# Patient Record
Sex: Female | Born: 1955
Health system: Southern US, Community
[De-identification: ages and names within clinical notes are randomized; demographics above are authoritative.]

## PROBLEM LIST (undated history)

## (undated) DIAGNOSIS — J349 Unspecified disorder of nose and nasal sinuses: Secondary | ICD-10-CM

## (undated) DIAGNOSIS — Z8041 Family history of malignant neoplasm of ovary: Secondary | ICD-10-CM

## (undated) DIAGNOSIS — Z803 Family history of malignant neoplasm of breast: Secondary | ICD-10-CM

## (undated) DIAGNOSIS — H269 Unspecified cataract: Secondary | ICD-10-CM

## (undated) DIAGNOSIS — E785 Hyperlipidemia, unspecified: Secondary | ICD-10-CM

## (undated) DIAGNOSIS — R040 Epistaxis: Secondary | ICD-10-CM

## (undated) DIAGNOSIS — M1990 Unspecified osteoarthritis, unspecified site: Secondary | ICD-10-CM

## (undated) DIAGNOSIS — C801 Malignant (primary) neoplasm, unspecified: Secondary | ICD-10-CM

## (undated) DIAGNOSIS — IMO0002 Reserved for concepts with insufficient information to code with codable children: Secondary | ICD-10-CM

## (undated) DIAGNOSIS — Z800 Family history of malignant neoplasm of digestive organs: Secondary | ICD-10-CM

## (undated) DIAGNOSIS — T7840XA Allergy, unspecified, initial encounter: Secondary | ICD-10-CM

## (undated) DIAGNOSIS — E079 Disorder of thyroid, unspecified: Secondary | ICD-10-CM

## (undated) HISTORY — DX: Disorder of thyroid, unspecified: E07.9

## (undated) HISTORY — DX: Allergy, unspecified, initial encounter: T78.40XA

## (undated) HISTORY — DX: Hyperlipidemia, unspecified: E78.5

## (undated) HISTORY — DX: Unspecified disorder of nose and nasal sinuses: J34.9

## (undated) HISTORY — DX: Unspecified cataract: H26.9

## (undated) HISTORY — DX: Family history of malignant neoplasm of breast: Z80.3

## (undated) HISTORY — DX: Epistaxis: R04.0

## (undated) HISTORY — DX: Family history of malignant neoplasm of ovary: Z80.41

## (undated) HISTORY — DX: Family history of malignant neoplasm of digestive organs: Z80.0

## (undated) HISTORY — PX: BREAST SURGERY: SHX581

## (undated) HISTORY — PX: BRAIN SURGERY: SHX531

## (undated) HISTORY — DX: Unspecified osteoarthritis, unspecified site: M19.90

## (undated) HISTORY — DX: Malignant (primary) neoplasm, unspecified: C80.1

## (undated) HISTORY — DX: Reserved for concepts with insufficient information to code with codable children: IMO0002

## (undated) HISTORY — PX: COLONOSCOPY: SHX174

---

## 1995-12-01 HISTORY — PX: GANGLION CYST EXCISION: SHX1691

## 2000-06-24 ENCOUNTER — Other Ambulatory Visit: Admission: RE | Admit: 2000-06-24 | Discharge: 2000-06-24 | Payer: Self-pay | Admitting: Gynecology

## 2001-01-19 ENCOUNTER — Other Ambulatory Visit: Admission: RE | Admit: 2001-01-19 | Discharge: 2001-01-19 | Payer: Self-pay | Admitting: Gynecology

## 2001-07-28 ENCOUNTER — Other Ambulatory Visit: Admission: RE | Admit: 2001-07-28 | Discharge: 2001-07-28 | Payer: Self-pay | Admitting: Gynecology

## 2002-08-17 ENCOUNTER — Other Ambulatory Visit: Admission: RE | Admit: 2002-08-17 | Discharge: 2002-08-17 | Payer: Self-pay | Admitting: Gynecology

## 2003-08-23 ENCOUNTER — Other Ambulatory Visit: Admission: RE | Admit: 2003-08-23 | Discharge: 2003-08-23 | Payer: Self-pay | Admitting: Gynecology

## 2004-09-10 ENCOUNTER — Other Ambulatory Visit: Admission: RE | Admit: 2004-09-10 | Discharge: 2004-09-10 | Payer: Self-pay | Admitting: Gynecology

## 2005-06-05 ENCOUNTER — Ambulatory Visit: Payer: Self-pay | Admitting: Gastroenterology

## 2005-07-01 ENCOUNTER — Ambulatory Visit: Payer: Self-pay | Admitting: Gastroenterology

## 2008-04-01 DIAGNOSIS — C801 Malignant (primary) neoplasm, unspecified: Secondary | ICD-10-CM

## 2008-04-01 HISTORY — DX: Malignant (primary) neoplasm, unspecified: C80.1

## 2008-10-30 HISTORY — PX: BREAST SURGERY: SHX581

## 2008-11-11 ENCOUNTER — Encounter: Admission: RE | Admit: 2008-11-11 | Discharge: 2008-11-11 | Payer: Self-pay | Admitting: General Surgery

## 2008-11-18 ENCOUNTER — Encounter: Admission: RE | Admit: 2008-11-18 | Discharge: 2008-11-18 | Payer: Self-pay | Admitting: General Surgery

## 2008-11-23 ENCOUNTER — Encounter (INDEPENDENT_AMBULATORY_CARE_PROVIDER_SITE_OTHER): Payer: Self-pay | Admitting: General Surgery

## 2008-11-23 ENCOUNTER — Ambulatory Visit (HOSPITAL_BASED_OUTPATIENT_CLINIC_OR_DEPARTMENT_OTHER): Admission: RE | Admit: 2008-11-23 | Discharge: 2008-11-23 | Payer: Self-pay | Admitting: General Surgery

## 2008-11-30 ENCOUNTER — Ambulatory Visit: Payer: Self-pay | Admitting: Oncology

## 2008-12-06 ENCOUNTER — Ambulatory Visit: Admission: RE | Admit: 2008-12-06 | Discharge: 2009-02-03 | Payer: Self-pay | Admitting: Radiation Oncology

## 2008-12-15 LAB — CBC WITH DIFFERENTIAL (CANCER CENTER ONLY)
BASO#: 0.1 10*3/uL (ref 0.0–0.2)
Eosinophils Absolute: 0.2 10*3/uL (ref 0.0–0.5)
HGB: 13 g/dL (ref 11.6–15.9)
LYMPH%: 48.7 % — ABNORMAL HIGH (ref 14.0–48.0)
MCH: 32.2 pg (ref 26.0–34.0)
MCV: 94 fL (ref 81–101)
MONO#: 0.4 10*3/uL (ref 0.1–0.9)
MONO%: 7.2 % (ref 0.0–13.0)
RBC: 4.04 10*6/uL (ref 3.70–5.32)
WBC: 5.6 10*3/uL (ref 3.9–10.0)

## 2008-12-15 LAB — CMP (CANCER CENTER ONLY)
Albumin: 3.6 g/dL (ref 3.3–5.5)
Alkaline Phosphatase: 73 U/L (ref 26–84)
BUN, Bld: 15 mg/dL (ref 7–22)
CO2: 31 mEq/L (ref 18–33)
Calcium: 9.1 mg/dL (ref 8.0–10.3)
Glucose, Bld: 98 mg/dL (ref 73–118)
Potassium: 3.8 mEq/L (ref 3.3–4.7)

## 2009-02-13 ENCOUNTER — Ambulatory Visit: Payer: Self-pay | Admitting: Oncology

## 2009-02-15 LAB — CBC WITH DIFFERENTIAL (CANCER CENTER ONLY)
BASO#: 0 10*3/uL (ref 0.0–0.2)
Eosinophils Absolute: 0.2 10*3/uL (ref 0.0–0.5)
HGB: 13.6 g/dL (ref 11.6–15.9)
LYMPH#: 1.3 10*3/uL (ref 0.9–3.3)
MONO#: 0.3 10*3/uL (ref 0.1–0.9)
NEUT#: 2.6 10*3/uL (ref 1.5–6.5)
Platelets: 254 10*3/uL (ref 145–400)
RBC: 4.24 10*6/uL (ref 3.70–5.32)
WBC: 4.5 10*3/uL (ref 3.9–10.0)

## 2009-02-15 LAB — CMP (CANCER CENTER ONLY)
Alkaline Phosphatase: 78 U/L (ref 26–84)
Creat: 0.5 mg/dl — ABNORMAL LOW (ref 0.6–1.2)
Glucose, Bld: 109 mg/dL (ref 73–118)
Sodium: 140 mEq/L (ref 128–145)
Total Bilirubin: 0.7 mg/dl (ref 0.20–1.60)
Total Protein: 7.1 g/dL (ref 6.4–8.1)

## 2009-05-02 ENCOUNTER — Ambulatory Visit: Payer: Self-pay | Admitting: Oncology

## 2009-05-04 LAB — CMP (CANCER CENTER ONLY)
ALT(SGPT): 20 U/L (ref 10–47)
CO2: 29 mEq/L (ref 18–33)
Creat: 1 mg/dl (ref 0.6–1.2)
Glucose, Bld: 108 mg/dL (ref 73–118)
Total Bilirubin: 0.5 mg/dl (ref 0.20–1.60)

## 2009-05-04 LAB — CBC WITH DIFFERENTIAL (CANCER CENTER ONLY)
BASO%: 0.5 % (ref 0.0–2.0)
HCT: 39.7 % (ref 34.8–46.6)
LYMPH%: 31.2 % (ref 14.0–48.0)
MCV: 94 fL (ref 81–101)
MONO#: 0.3 10*3/uL (ref 0.1–0.9)
NEUT%: 58.3 % (ref 39.6–80.0)
RDW: 10.8 % (ref 10.5–14.6)
WBC: 4.9 10*3/uL (ref 3.9–10.0)

## 2009-11-01 ENCOUNTER — Ambulatory Visit: Payer: Self-pay | Admitting: Oncology

## 2009-11-07 LAB — CMP (CANCER CENTER ONLY)
ALT(SGPT): 30 U/L (ref 10–47)
AST: 30 U/L (ref 11–38)
CO2: 29 mEq/L (ref 18–33)
Creat: 0.7 mg/dl (ref 0.6–1.2)
Total Bilirubin: 0.6 mg/dl (ref 0.20–1.60)

## 2009-11-07 LAB — CBC WITH DIFFERENTIAL (CANCER CENTER ONLY)
BASO%: 0.6 % (ref 0.0–2.0)
EOS%: 3.9 % (ref 0.0–7.0)
HCT: 38.3 % (ref 34.8–46.6)
LYMPH%: 36 % (ref 14.0–48.0)
MCHC: 35 g/dL (ref 32.0–36.0)
MCV: 91 fL (ref 81–101)
MONO%: 7.1 % (ref 0.0–13.0)
NEUT%: 52.4 % (ref 39.6–80.0)
RDW: 11.2 % (ref 10.5–14.6)

## 2010-04-01 DIAGNOSIS — IMO0002 Reserved for concepts with insufficient information to code with codable children: Secondary | ICD-10-CM

## 2010-04-01 HISTORY — DX: Reserved for concepts with insufficient information to code with codable children: IMO0002

## 2010-07-07 LAB — CBC
HCT: 42.3 % (ref 36.0–46.0)
Hemoglobin: 14.8 g/dL (ref 12.0–15.0)
RBC: 4.51 MIL/uL (ref 3.87–5.11)
WBC: 6.7 10*3/uL (ref 4.0–10.5)

## 2010-07-07 LAB — COMPREHENSIVE METABOLIC PANEL
ALT: 25 U/L (ref 0–35)
Alkaline Phosphatase: 79 U/L (ref 39–117)
CO2: 30 mEq/L (ref 19–32)
Chloride: 104 mEq/L (ref 96–112)
GFR calc non Af Amer: >60 mL/min (ref >60)
Glucose, Bld: 87 mg/dL (ref 70–99)
Potassium: 4.4 mEq/L (ref 3.5–5.1)
Sodium: 138 mEq/L (ref 135–145)

## 2010-07-07 LAB — CANCER ANTIGEN 27.29: CA 27.29: 10 U/mL (ref 0–39)

## 2010-07-07 LAB — LACTATE DEHYDROGENASE: LDH: 157 U/L (ref 94–250)

## 2010-07-07 LAB — DIFFERENTIAL
Basophils Relative: 0 % (ref 0–1)
Eosinophils Absolute: 0.1 10*3/uL (ref 0.0–0.7)
Eosinophils Relative: 2 % (ref 0–5)
Neutrophils Relative %: 50 % (ref 43–77)

## 2010-08-02 ENCOUNTER — Encounter (INDEPENDENT_AMBULATORY_CARE_PROVIDER_SITE_OTHER): Payer: Self-pay | Admitting: General Surgery

## 2010-08-14 NOTE — Op Note (Signed)
Julia Poole, Julia Poole                ACCOUNT NO.:  [id number]   MEDICAL RECORD NO.:  [id number]          PATIENT TYPE:  AMB   LOCATION:  DSC                          FACILITY:  [facility]   PHYSICIAN:  Juanetta Gosling, MDDATE OF BIRTH:  June 10, 1955   DATE OF PROCEDURE:  11/23/2008  DATE OF DISCHARGE:                               OPERATIVE REPORT   PREOPERATIVE DIAGNOSIS:  Right breast ductal carcinoma in situ.   POSTOPERATIVE DIAGNOSIS:  Right breast ductal carcinoma in situ.   Clinical stage 0   PROCEDURE PERFORMED:  Right breast wire-guided lumpectomy.   SURGEON:  Troy Sine. Dwain Sarna, MD   ASSISTANT:  None.   ANESTHESIA:  General.   SUPERVISING ANESTHESIOLOGIST:  Janetta Hora. Gelene Mink, MD   FINDINGS:  Mammographic confirmation removal of clip and calcifications.   SPECIMENS:  Right breast tissue to pathology.   ESTIMATED BLOOD LOSS:  Minimal.   COMPLICATIONS:  None.   DRAINS:  None.   DISPOSITION:  To recovery room in stable condition.   INDICATIONS:  Mrs. Racine is a 55 year old female who had a BI-RADS 4  mammogram and recommended for a biopsy.  She underwent a stereotactic  biopsy showing likely high-grade DCIS that was ER/PR positive.  She also  had an MRI that showed no axillary or internal mammary chain  lymphadenopathy, biopsy clip artifact, and no mass or asymmetry or  dominant area of enhancement that is concerning in her MRI.  We  discussed options for therapy for DCIS including lumpectomy, mastectomy,  standard radiation, MammoSite radiation as well as the role sentinel  lymph node biopsy.  After a long discussion with her and her husband, we  have decided to perform a right breast wire-guided lumpectomy with the  understanding that she may need further surgery for positive margins and  may need a sentinel lymph node biopsy if there is any invasive disease  and certainly likely will need radiation treatments afterwards.   PROCEDURE IN DETAIL:  After  informed consent was obtained, she was first  [facility]  Dr. Samuella Cota placed two wires in the region of  the calcifications and clipped as the clips had migrated before.  I had  her mammograms available for review in the operating room.  She was then  brought to [facility].  She was administered 1 gram of intravenous  cefazolin.  Sequential compression devices were placed on lower  extremities prior to operation.  Her right breast was prepped with  ChloraPrep.  Three minutes were allowed to pass.  Her breast was then  draped in a standard sterile surgical fashion.  Surgical time-out was  then performed.   A radial incision was made in the lateral portion of her right breast  and I excised a skin ellipse over the area I was going to excise.  Dissection was then made with electrocautery down to the level of the  pectoralis muscle and the entire area as well as the both wires were  removed.  This was a fairly large area that the wires were in fairly  deep.  There  was also a very thickened area that I removed this and felt  abnormal near where the lesion was.  There was one blood vessel that was  suture ligated with a 2-0 Vicryl stick tie during this portion of the  procedure.  Upon completion, I did a Faxitron mammogram that appeared to  show the calcifications and clips.  Dr. Samuella Cota then confirmed this on a  specimen mammogram.  Hemostasis was observed.  I closed the deep layers  with a 2-0 Vicryl.  The dermis was closed with a 3-0 Vicryl and the skin  was closed with 4-0 Monocryl in a subcuticular fashion.  Steri-Strips  and a sterile dressing were placed over this and 10 mL of 0.25% Marcaine  were infiltrated into the wound.  She tolerated this well, was extubated  in the operating room, and transferred to the recovery room in stable  condition.      Juanetta Gosling, MD  Electronically Signed     MCW/MEDQ  D:  11/23/2008  T:  11/24/2008  Job:  [id number]    cc:   Luvenia Redden, M.D.

## 2010-08-22 ENCOUNTER — Encounter: Payer: Self-pay | Admitting: Gastroenterology

## 2010-08-28 ENCOUNTER — Encounter: Payer: Self-pay | Admitting: Gastroenterology

## 2010-09-12 ENCOUNTER — Ambulatory Visit (AMBULATORY_SURGERY_CENTER): Payer: Federal, State, Local not specified - PPO | Admitting: *Deleted

## 2010-09-12 VITALS — Ht 65.0 in | Wt 184.1 lb

## 2010-09-12 DIAGNOSIS — Z800 Family history of malignant neoplasm of digestive organs: Secondary | ICD-10-CM

## 2010-09-12 MED ORDER — PEG-KCL-NACL-NASULF-NA ASC-C 100 G PO SOLR: ORAL | Status: DC

## 2010-09-13 ENCOUNTER — Telehealth: Payer: Self-pay | Admitting: Gastroenterology

## 2010-09-13 ENCOUNTER — Telehealth: Payer: Self-pay | Admitting: *Deleted

## 2010-09-13 ENCOUNTER — Encounter: Payer: Self-pay | Admitting: *Deleted

## 2010-09-13 NOTE — Telephone Encounter (Signed)
error 

## 2010-09-13 NOTE — Telephone Encounter (Signed)
Error

## 2010-09-13 NOTE — Telephone Encounter (Signed)
Answered pt. Questions re: meds and prep  Julia Poole

## 2010-09-13 NOTE — Telephone Encounter (Addendum)
Left message on answering machine   Spoke with pt. Encounter complete  Julia Poole

## 2010-09-17 ENCOUNTER — Encounter: Payer: Self-pay | Admitting: Gastroenterology

## 2010-09-26 ENCOUNTER — Encounter: Payer: Self-pay | Admitting: Gastroenterology

## 2010-09-26 ENCOUNTER — Ambulatory Visit (AMBULATORY_SURGERY_CENTER): Payer: Federal, State, Local not specified - PPO | Admitting: Gastroenterology

## 2010-09-26 VITALS — BP 125/73 | HR 65 | Temp 98.2°F | Resp 23 | Ht 65.0 in | Wt 185.0 lb

## 2010-09-26 DIAGNOSIS — Z1211 Encounter for screening for malignant neoplasm of colon: Secondary | ICD-10-CM

## 2010-09-26 DIAGNOSIS — Z800 Family history of malignant neoplasm of digestive organs: Secondary | ICD-10-CM

## 2010-09-26 MED ORDER — SODIUM CHLORIDE 0.9 % IV SOLN: 500.0000 mL | INTRAVENOUS | Status: DC

## 2010-09-26 NOTE — Patient Instructions (Signed)
Resume your routine medications today.  Call us if you have any questions or concerns at [phone]. Thank-you.

## 2010-09-27 ENCOUNTER — Telehealth: Payer: Self-pay

## 2010-09-27 NOTE — Telephone Encounter (Signed)

## 2010-11-19 ENCOUNTER — Encounter (HOSPITAL_BASED_OUTPATIENT_CLINIC_OR_DEPARTMENT_OTHER): Payer: Federal, State, Local not specified - PPO | Admitting: Oncology

## 2010-11-19 ENCOUNTER — Other Ambulatory Visit: Payer: Self-pay | Admitting: Oncology

## 2010-11-19 DIAGNOSIS — D0590 Unspecified type of carcinoma in situ of unspecified breast: Secondary | ICD-10-CM

## 2010-11-19 DIAGNOSIS — C50419 Malignant neoplasm of upper-outer quadrant of unspecified female breast: Secondary | ICD-10-CM

## 2010-11-19 LAB — COMPREHENSIVE METABOLIC PANEL
ALT: 27 U/L (ref 0–35)
CO2: 28 mEq/L (ref 19–32)
Calcium: 9.7 mg/dL (ref 8.4–10.5)
Chloride: 104 mEq/L (ref 96–112)
Sodium: 143 mEq/L (ref 135–145)
Total Bilirubin: 0.6 mg/dL (ref 0.3–1.2)
Total Protein: 6.9 g/dL (ref 6.0–8.3)

## 2010-11-19 LAB — CBC WITH DIFFERENTIAL/PLATELET
BASO%: 0.2 % (ref 0.0–2.0)
MCHC: 34.4 g/dL (ref 31.5–36.0)
MONO#: 0.3 10*3/uL (ref 0.1–0.9)
RBC: 4.47 10*6/uL (ref 3.70–5.45)
WBC: 4.9 10*3/uL (ref 3.9–10.3)
lymph#: 1.6 10*3/uL (ref 0.9–3.3)

## 2010-11-29 ENCOUNTER — Encounter: Payer: Self-pay | Admitting: General Surgery

## 2011-01-07 ENCOUNTER — Other Ambulatory Visit: Payer: Self-pay | Admitting: Specialist

## 2011-01-07 DIAGNOSIS — M545 Low back pain: Secondary | ICD-10-CM

## 2011-01-14 ENCOUNTER — Ambulatory Visit
Admission: RE | Admit: 2011-01-14 | Discharge: 2011-01-14 | Disposition: A | Discharge status: Home / Self Care | Payer: Federal, State, Local not specified - PPO | Source: Ambulatory Visit | Attending: Specialist | Admitting: Specialist

## 2011-01-14 DIAGNOSIS — M545 Low back pain: Secondary | ICD-10-CM

## 2011-01-29 ENCOUNTER — Ambulatory Visit: Payer: Federal, State, Local not specified - PPO | Attending: Specialist | Admitting: Physical Therapy

## 2011-01-29 DIAGNOSIS — M545 Low back pain, unspecified: Secondary | ICD-10-CM | POA: Insufficient documentation

## 2011-01-29 DIAGNOSIS — IMO0001 Reserved for inherently not codable concepts without codable children: Secondary | ICD-10-CM | POA: Insufficient documentation

## 2011-01-29 DIAGNOSIS — R5381 Other malaise: Secondary | ICD-10-CM | POA: Insufficient documentation

## 2011-02-01 ENCOUNTER — Ambulatory Visit: Payer: Federal, State, Local not specified - PPO | Attending: Specialist | Admitting: *Deleted

## 2011-02-01 DIAGNOSIS — R5381 Other malaise: Secondary | ICD-10-CM | POA: Insufficient documentation

## 2011-02-01 DIAGNOSIS — IMO0001 Reserved for inherently not codable concepts without codable children: Secondary | ICD-10-CM | POA: Insufficient documentation

## 2011-02-01 DIAGNOSIS — M545 Low back pain, unspecified: Secondary | ICD-10-CM | POA: Insufficient documentation

## 2011-02-05 ENCOUNTER — Ambulatory Visit: Payer: Federal, State, Local not specified - PPO | Admitting: *Deleted

## 2011-02-08 ENCOUNTER — Ambulatory Visit: Payer: Federal, State, Local not specified - PPO | Admitting: *Deleted

## 2011-02-12 ENCOUNTER — Ambulatory Visit: Payer: Federal, State, Local not specified - PPO | Admitting: *Deleted

## 2011-02-15 ENCOUNTER — Ambulatory Visit: Payer: Federal, State, Local not specified - PPO | Admitting: *Deleted

## 2011-02-19 ENCOUNTER — Ambulatory Visit: Payer: Federal, State, Local not specified - PPO | Admitting: Physical Therapy

## 2011-02-25 ENCOUNTER — Ambulatory Visit: Payer: Federal, State, Local not specified - PPO | Admitting: Physical Therapy

## 2011-02-28 ENCOUNTER — Ambulatory Visit: Payer: Federal, State, Local not specified - PPO | Admitting: Physical Therapy

## 2011-03-07 ENCOUNTER — Ambulatory Visit: Payer: Federal, State, Local not specified - PPO | Attending: Specialist | Admitting: Physical Therapy

## 2011-03-07 DIAGNOSIS — M545 Low back pain, unspecified: Secondary | ICD-10-CM | POA: Insufficient documentation

## 2011-03-07 DIAGNOSIS — IMO0001 Reserved for inherently not codable concepts without codable children: Secondary | ICD-10-CM | POA: Insufficient documentation

## 2011-03-07 DIAGNOSIS — R5381 Other malaise: Secondary | ICD-10-CM | POA: Insufficient documentation

## 2011-03-11 ENCOUNTER — Encounter: Payer: Federal, State, Local not specified - PPO | Admitting: Physical Therapy

## 2011-03-12 ENCOUNTER — Encounter: Payer: Federal, State, Local not specified - PPO | Admitting: Physical Therapy

## 2011-03-14 ENCOUNTER — Encounter: Payer: Federal, State, Local not specified - PPO | Admitting: Physical Therapy

## 2011-03-18 ENCOUNTER — Ambulatory Visit: Payer: Federal, State, Local not specified - PPO | Admitting: Physical Therapy

## 2011-03-21 ENCOUNTER — Ambulatory Visit: Payer: Federal, State, Local not specified - PPO | Admitting: *Deleted

## 2011-05-16 ENCOUNTER — Encounter (INDEPENDENT_AMBULATORY_CARE_PROVIDER_SITE_OTHER): Payer: Self-pay | Admitting: General Surgery

## 2011-05-16 ENCOUNTER — Ambulatory Visit (INDEPENDENT_AMBULATORY_CARE_PROVIDER_SITE_OTHER): Payer: Federal, State, Local not specified - PPO | Admitting: General Surgery

## 2011-05-16 VITALS — BP 142/88 | HR 76 | Temp 98.0°F | Resp 12 | Ht 65.5 in | Wt 177.6 lb

## 2011-05-16 DIAGNOSIS — C50919 Malignant neoplasm of unspecified site of unspecified female breast: Secondary | ICD-10-CM

## 2011-05-16 DIAGNOSIS — C50911 Malignant neoplasm of unspecified site of right female breast: Secondary | ICD-10-CM | POA: Insufficient documentation

## 2011-05-16 NOTE — Patient Instructions (Signed)

## 2011-05-16 NOTE — Progress Notes (Signed)
Subjective:     Patient ID: Julia Poole, female   DOB: 06-17-1955, 56 y.o.   MRN: [id number]  HPI This is a 56 year old female who underwent a  right breast lumpectomy in August of 2010 for staging or breast cancer. This was followed by radiation therapy and she is now on tamoxifen. She had mammogram in August which is read as BI-RADS 2. There is a seroma/hematoma at the surgical site the is unchanged. She is otherwise doing well and reports no complaints referable to her breasts.  Review of Systems     Objective:   Physical Exam  Vitals reviewed. Constitutional: She appears well-developed and well-nourished.  Neck: Neck supple.  Pulmonary/Chest: Right breast exhibits no inverted nipple, no mass, no nipple discharge, no skin change and no tenderness. Left breast exhibits no inverted nipple, no mass, no nipple discharge, no skin change and no tenderness. Breasts are symmetrical.    Lymphadenopathy:    She has no cervical adenopathy.    She has no axillary adenopathy.       Right: No supraclavicular adenopathy present.       Left: No supraclavicular adenopathy present.       Assessment:     History of breast cancer    Plan:     She has no clinical evidence of recurrence. She is doing around self exams. She is going to see medical oncology in 6 months I will see her annually. I reviewed her mammogram and it shows just changes from her surgery. She did do to get her next mammogram in August.e

## 2011-07-15 LAB — HM DEXA SCAN: HM Dexa Scan: NORMAL

## 2011-10-25 ENCOUNTER — Telehealth: Payer: Self-pay | Admitting: Oncology

## 2011-10-25 NOTE — Telephone Encounter (Signed)
lmonvm for pt re appt for 10/21 and mailed schedule.

## 2011-12-16 ENCOUNTER — Encounter (INDEPENDENT_AMBULATORY_CARE_PROVIDER_SITE_OTHER): Payer: Self-pay

## 2012-01-20 ENCOUNTER — Ambulatory Visit (HOSPITAL_BASED_OUTPATIENT_CLINIC_OR_DEPARTMENT_OTHER): Admitting: Oncology

## 2012-01-20 ENCOUNTER — Telehealth: Payer: Self-pay | Admitting: *Deleted

## 2012-01-20 ENCOUNTER — Encounter: Payer: Self-pay | Admitting: Oncology

## 2012-01-20 ENCOUNTER — Other Ambulatory Visit (HOSPITAL_BASED_OUTPATIENT_CLINIC_OR_DEPARTMENT_OTHER): Payer: Federal, State, Local not specified - PPO | Admitting: Lab

## 2012-01-20 VITALS — BP 101/69 | HR 69 | Temp 98.3°F | Resp 20 | Ht 65.5 in | Wt 175.5 lb

## 2012-01-20 DIAGNOSIS — D0590 Unspecified type of carcinoma in situ of unspecified breast: Secondary | ICD-10-CM

## 2012-01-20 DIAGNOSIS — Z170 Estrogen receptor positive status [ER+]: Secondary | ICD-10-CM

## 2012-01-20 DIAGNOSIS — C50911 Malignant neoplasm of unspecified site of right female breast: Secondary | ICD-10-CM

## 2012-01-20 LAB — COMPREHENSIVE METABOLIC PANEL (CC13)
ALT: 28 U/L (ref 0–55)
AST: 24 U/L (ref 5–34)
Alkaline Phosphatase: 59 U/L (ref 40–150)
Creatinine: 0.7 mg/dL (ref 0.6–1.1)
Sodium: 142 mEq/L (ref 136–145)
Total Bilirubin: 0.6 mg/dL (ref 0.20–1.20)
Total Protein: 6.2 g/dL — ABNORMAL LOW (ref 6.4–8.3)

## 2012-01-20 LAB — CBC WITH DIFFERENTIAL/PLATELET
BASO%: 0.2 % (ref 0.0–2.0)
EOS%: 4.1 % (ref 0.0–7.0)
HCT: 40.1 % (ref 34.8–46.6)
LYMPH%: 44.5 % (ref 14.0–49.7)
MCH: 31.3 pg (ref 25.1–34.0)
MCHC: 33.5 g/dL (ref 31.5–36.0)
MONO#: 0.4 10*3/uL (ref 0.1–0.9)
NEUT%: 42.8 % (ref 38.4–76.8)
Platelets: 217 10*3/uL (ref 145–400)
RBC: 4.3 10*6/uL (ref 3.70–5.45)
WBC: 4.9 10*3/uL (ref 3.9–10.3)

## 2012-01-20 MED ORDER — TAMOXIFEN CITRATE 20 MG PO TABS: 20.0000 mg | ORAL_TABLET | Freq: Every day | ORAL | Status: DC

## 2012-01-20 NOTE — Progress Notes (Signed)
OFFICE PROGRESS NOTE  CC  Julia Pierini, FNP [address] [place] [address] Dr. Emelia Loron  DIAGNOSIS: 56 year old female with high-grade DCIS of the right breast diagnosed August 2010  PRIOR THERAPY:  #1 patient underwent a lumpectomy in August 2002 and that revealed a 0.9 cm DCIS high grade to intermediate grade with focal necrosis tumor was ER +100% PR +97%.  #2 patient then went on to receive radiation therapy from 12/21/2008 to 01/31/2009 to a total of 60 gray.  #3 in November 2010 patient began tamoxifen 20 mg daily.  CURRENT THERAPY: Tamoxifen 20 mg daily  INTERVAL HISTORY: Julia Poole 56 y.o. female returns for followup visit today. She was seen by Dr. Dwain Sarna in February 2013. Clinically she seems to be doing well she's tolerating tamoxifen very nicely. She does tell me that she lost her sister to CLL. I have been her sisters physician as well. Patient still continues to be grieving. She otherwise denies any fevers chills night sweats headaches shortness of breath chest pains palpitations no myalgias and arthralgias. She does get some hot flashes after she takes the tamoxifen but they're not persistent. Remainder of the 10 point review of systems is negative.  MEDICAL HISTORY: Past Medical History  Diagnosis Date  . Hemorrhoids   . Bleeding nose   . Sinus problem   . Arthritis   . Allergy   . Cancer     right breast    ALLERGIES:   has no known allergies.  MEDICATIONS:  Current Outpatient Prescriptions  Medication Sig Dispense Refill  . Ascorbic Acid (VITAMIN C) 500 MG tablet Take 500 mg by mouth daily.       Marland Kitchen aspirin 81 MG tablet Take 81 mg by mouth daily.        . cyanocobalamin 100 MCG tablet Take 100 mcg by mouth daily.      Marland Kitchen GARLIC PO Take 7,829 mg by mouth daily.      Marland Kitchen HYDROcodone-acetaminophen (NORCO/VICODIN) 5-325 MG per tablet Take 1 tablet by mouth every 6 (six) hours as needed. 1/2 tablet at night      . levothyroxine  (SYNTHROID, LEVOTHROID) 25 MCG tablet Take 25 mcg by mouth daily.      . Multiple Vitamins-Minerals (CENTRUM SILVER PO) Take by mouth.        . naproxen sodium (ANAPROX) 220 MG tablet Take 220 mg by mouth daily.      . tamoxifen (NOLVADEX) 10 MG tablet Take 20 mg by mouth daily.         SURGICAL HISTORY:  Past Surgical History  Procedure Date  . Ganglion cyst excision SEP. 1997    RIGHT WRIST  . Colonoscopy   . Breast surgery AUG. 2010    right breast lumpectomy    REVIEW OF SYSTEMS:  Pertinent items are noted in HPI.   HEALTH MAINTENANCE:  PHYSICAL EXAMINATION: Blood pressure 101/69, pulse 69, temperature 98.3 F (36.8 C), temperature source Oral, resp. rate 20, height 5' 5.5" (1.664 m), weight 175 lb 8 oz (79.606 kg). Body mass index is 28.76 kg/(m^2). ECOG PERFORMANCE STATUS: 0 - Asymptomatic Well-developed well-nourished female in no acute distress HEENT exam EOMI PERRLA sclerae anicteric no conjunctival pallor oral mucosa is moist neck is supple lungs are clear bilaterally to auscultation cardiovascular is regular rate rhythm no murmurs gallops or rubs abdomen is soft nontender nondistended bowel sounds are present no HSM extremities no edema neuro patient's alert oriented otherwise nonfocal right breast exam reveals a barely  visible incisional scar there is no nodularity no tenderness no masses. Left breast no masses or nipple discharge.   LABORATORY DATA: Lab Results  Component Value Date   WBC 4.9 01/20/2012   HGB 13.5 01/20/2012   HCT 40.1 01/20/2012   MCV 93.4 01/20/2012   PLT 217 01/20/2012      Chemistry      Component Value Date/Time   NA 143 11/19/2010 1008   NA 139 11/07/2009 1355   K 3.9 11/19/2010 1008   K 4.0 11/07/2009 1355   CL 104 11/19/2010 1008   CL 102 11/07/2009 1355   CO2 28 11/19/2010 1008   CO2 29 11/07/2009 1355   BUN 16 11/19/2010 1008   BUN 13 11/07/2009 1355   CREATININE 0.67 11/19/2010 1008   CREATININE 0.7 11/07/2009 1355      Component Value  Date/Time   CALCIUM 9.7 11/19/2010 1008   CALCIUM 9.1 11/07/2009 1355   ALKPHOS 62 11/19/2010 1008   ALKPHOS 60 11/07/2009 1355   AST 27 11/19/2010 1008   AST 30 11/07/2009 1355   ALT 27 11/19/2010 1008   BILITOT 0.6 11/19/2010 1008   BILITOT 0.60 11/07/2009 1355       RADIOGRAPHIC STUDIES:  No results found.  ASSESSMENT: 56 year old female with DCIS of the right breast status post lumpectomy in August 2010 followed by radiation now on tamoxifen 20 mg daily since November 2010. Her tumor was ER +100%. Overall she is doing well she has no evidence of recurrent disease. Her mammograms are up to date.   PLAN: Patient will continue tamoxifen 20 mg on a daily basis my original plan was to continue her for 5 years. However patient is thinking about extending her therapy to 10 years which I think is fine. She is seeing Dr. Dwain Sarna every 6 months and therefore we will go ahead see her back in one year. She knows to call me with any problems   All questions were answered. The patient knows to call the clinic with any problems, questions or concerns. We can certainly see the patient much sooner if necessary.  I spent 25 minutes counseling the patient face to face. The total time spent in the appointment was 30 minutes.    Drue Second, MD Medical/Oncology [facility] [phone] (beeper) [phone] (Office)  01/20/2012, 10:03 AM

## 2012-01-20 NOTE — Telephone Encounter (Signed)
Gave patient appointment for 01-21-2013 starting at 9:30am

## 2012-01-20 NOTE — Patient Instructions (Addendum)
Continue tamoxifen 20 mg daily  Take vitamin D3 2000 iu  I will see you back in 6 months

## 2012-07-06 ENCOUNTER — Other Ambulatory Visit: Payer: Self-pay | Admitting: Gynecology

## 2012-07-20 ENCOUNTER — Telehealth: Payer: Self-pay | Admitting: Nurse Practitioner

## 2012-07-20 NOTE — Telephone Encounter (Signed)
Need to know when filled  Last and [facility] taking

## 2012-07-21 ENCOUNTER — Ambulatory Visit (INDEPENDENT_AMBULATORY_CARE_PROVIDER_SITE_OTHER): Payer: Federal, State, Local not specified - PPO | Admitting: General Surgery

## 2012-07-21 ENCOUNTER — Encounter (INDEPENDENT_AMBULATORY_CARE_PROVIDER_SITE_OTHER): Payer: Self-pay | Admitting: General Surgery

## 2012-07-21 VITALS — BP 126/82 | HR 62 | Temp 96.3°F | Ht 65.0 in | Wt 181.4 lb

## 2012-07-21 DIAGNOSIS — C50911 Malignant neoplasm of unspecified site of right female breast: Secondary | ICD-10-CM

## 2012-07-21 DIAGNOSIS — C50919 Malignant neoplasm of unspecified site of unspecified female breast: Secondary | ICD-10-CM

## 2012-07-21 NOTE — Telephone Encounter (Signed)
LAST FILLED 05/28/12 AND CHART IS ON DESK

## 2012-07-21 NOTE — Patient Instructions (Signed)

## 2012-07-21 NOTE — Telephone Encounter (Signed)
Mmm to address 

## 2012-07-21 NOTE — Telephone Encounter (Signed)
Need to know when was filled last

## 2012-07-21 NOTE — Progress Notes (Signed)
Subjective:     Patient ID: Julia Poole, female   DOB: [birth year], 57 y.o.   MRN: [id number]  HPI This is a 57 year old female who underwent a right breast lumpectomy for ductal carcinoma in situ in 2012. She subsequently was treated with radiotherapy and is now on tamoxifen. She states she has had a gynecological ultrasound that is normal recently. She's really had no trouble on the tamoxifen. She presents today for her annual followup with no complaints referable to either breast. The only interval history is that she is now planned for disability due to lower back pain.  Review of Systems  Constitutional: Negative for fever, chills and unexpected weight change.  HENT: Negative for hearing loss, congestion, sore throat, trouble swallowing and voice change.   Eyes: Negative for visual disturbance.  Respiratory: Negative for cough and wheezing.   Cardiovascular: Negative for chest pain, palpitations and leg swelling.  Gastrointestinal: Negative for nausea, vomiting, abdominal pain, diarrhea, constipation, blood in stool, abdominal distention and anal bleeding.  Genitourinary: Negative for hematuria, vaginal bleeding and difficulty urinating.  Musculoskeletal: Positive for back pain. Negative for arthralgias.  Skin: Negative for rash and wound.  Neurological: Negative for seizures, syncope and headaches.  Hematological: Negative for adenopathy. Does not bruise/bleed easily.  Psychiatric/Behavioral: Negative for confusion.       Objective:   Physical Exam  Constitutional: She appears well-developed and well-nourished.  Eyes: No scleral icterus.  Pulmonary/Chest: Right breast exhibits no inverted nipple, no mass, no nipple discharge, no skin change and no tenderness. Left breast exhibits no inverted nipple, no mass, no nipple discharge, no skin change and no tenderness. Breasts are asymmetrical (right breast larger than left).    Lymphadenopathy:    She has no cervical adenopathy.      Assessment:     Stage 0 breast cancer     Plan:     She has no clinical evidence of recurrence. Her exam is normal. Her mammogram is up to date. She is due to see medical oncology again in 6 months. I will follow her her up in 1 year. She will get her mammogram in August as scheduled. She is going to do her own self breast exams. I offered her to see plastic surgery to discuss a reduction on the left side for symmetry. She is going to think about that for now.

## 2012-07-21 NOTE — Telephone Encounter (Signed)
Chart says patient seen at pain clinic. Is se not going there anymore/

## 2012-07-22 NOTE — Telephone Encounter (Signed)
APPOINTMENT MADE WITH TAMMY

## 2012-07-22 NOTE — Telephone Encounter (Signed)
Patient really NTBS to discuss

## 2012-07-22 NOTE — Telephone Encounter (Signed)
PATIENT AWARE TO MAKE APPOINTMENT

## 2012-07-22 NOTE — Telephone Encounter (Signed)
Has not seen tammy since last summer she didn't so she didn't know if she needed to go through them or not

## 2012-07-27 ENCOUNTER — Other Ambulatory Visit: Payer: Self-pay | Admitting: Specialist

## 2012-07-27 DIAGNOSIS — M545 Low back pain: Secondary | ICD-10-CM

## 2012-08-03 ENCOUNTER — Ambulatory Visit (INDEPENDENT_AMBULATORY_CARE_PROVIDER_SITE_OTHER): Payer: Federal, State, Local not specified - PPO | Admitting: Pharmacist

## 2012-08-03 VITALS — BP 120/75 | HR 70 | Ht 64.5 in | Wt 181.0 lb

## 2012-08-03 DIAGNOSIS — M4310 Spondylolisthesis, site unspecified: Secondary | ICD-10-CM

## 2012-08-03 DIAGNOSIS — E039 Hypothyroidism, unspecified: Secondary | ICD-10-CM

## 2012-08-03 DIAGNOSIS — G8929 Other chronic pain: Secondary | ICD-10-CM

## 2012-08-03 DIAGNOSIS — M549 Dorsalgia, unspecified: Secondary | ICD-10-CM

## 2012-08-03 DIAGNOSIS — E785 Hyperlipidemia, unspecified: Secondary | ICD-10-CM

## 2012-08-03 DIAGNOSIS — Q762 Congenital spondylolisthesis: Secondary | ICD-10-CM

## 2012-08-03 MED ORDER — HYDROCODONE-ACETAMINOPHEN 5-325 MG PO TABS: 0.5000 | ORAL_TABLET | Freq: Three times a day (TID) | ORAL | Status: DC | PRN

## 2012-08-03 NOTE — Progress Notes (Signed)
Subjective:    Julia Poole is a 57 y.o. female who presents for evaluation of chronic back pain. She sees Dr. Otelia Sergeant regularly and has been referred to Dr. Trey Sailors for an evaluation and consideration of surgery.  She is schedule for repeat MRI by Dr. Otelia Sergeant for 08/05/12.  Julia Poole is currently taking tylenol #3 1 tablet at night as needed for pain (she last got #30 tablets filled 05/29/2012 and still has about 14 tablets left in bottle) and alternates with hydrocodone/APAP 5/325mg  1/2 tablet every 6 hours as needed for pain (last got #60 filled 08/29/2011 and still has 1 tablet left in bottle) Patient has also been taking OTC naproxen 220mg  as needed for pain but recently Dr. Otelia Sergeant prescribed Daypro/oxaprozin 600mg  but she has not started  Diagnosed with spondylolisthesis in L4-5  Has tried TENS in past - had worsening of pain  Patient has been referred for Physical Therapy but has not started yet.  Records reviewed  Ashby Dawes of the pain: chronic back pain that comes and goes.  Mostly a dull aching pain. Location of the pain: lower back Intensity:  5/10 currently ;  Worse 9/10 ; best 4/10 Exacerbating factors:  Housework, bending over, standing for prolonged periods/cooking Relieving factors: rest, sitting, medications     Review of Systems Respiratory: negative    Objective:    BP 120/75  Pulse 70  Ht 5' 4.5" (1.638 m)  Wt 181 lb (82.101 kg)  BMI 30.6 kg/m2  General Appearance:    Alert, cooperative, no distress, appears stated age           Assessment:    Assessment: Chronic back pain/spondylolisthesis   Plan:   1.  D/c naproxen and start oxaprozin 600mg  as prescribed by Dr. Otelia Sergeant **take with food** 2.  Continue to use tylenol #3 and hydrocodone/APAP 5/325mg  alternating as needed for pain (per prescription records from [facility] Controlled Substance Database can determine that patient does not take either of these medications daily but only as needed.  3.  Continue with plan  to start physical therapy 4.  Labs needed for chronic conditions - appt set up with PCP/Mary Benjamin Stain, NP  RTC to see pharmacist in 3-4 months or sooner if needed for uncontrolled pain

## 2012-08-03 NOTE — Patient Instructions (Addendum)
Stop naproxen, replace with oxaprozin 600mg  (generic for Daypro).

## 2012-08-05 ENCOUNTER — Ambulatory Visit
Admission: RE | Admit: 2012-08-05 | Discharge: 2012-08-05 | Disposition: A | Discharge status: Home / Self Care | Payer: Federal, State, Local not specified - PPO | Source: Ambulatory Visit | Attending: Specialist | Admitting: Specialist

## 2012-08-05 ENCOUNTER — Other Ambulatory Visit

## 2012-08-05 DIAGNOSIS — M545 Low back pain, unspecified: Secondary | ICD-10-CM

## 2012-08-20 ENCOUNTER — Encounter: Payer: Self-pay | Admitting: Nurse Practitioner

## 2012-08-20 ENCOUNTER — Ambulatory Visit (INDEPENDENT_AMBULATORY_CARE_PROVIDER_SITE_OTHER): Payer: Federal, State, Local not specified - PPO | Admitting: Nurse Practitioner

## 2012-08-20 VITALS — BP 116/68 | HR 63 | Temp 97.3°F | Ht 64.5 in | Wt 180.0 lb

## 2012-08-20 DIAGNOSIS — E039 Hypothyroidism, unspecified: Secondary | ICD-10-CM

## 2012-08-20 DIAGNOSIS — E785 Hyperlipidemia, unspecified: Secondary | ICD-10-CM

## 2012-08-20 LAB — COMPLETE METABOLIC PANEL WITH GFR
AST: 25 U/L (ref 0–37)
Alkaline Phosphatase: 54 U/L (ref 39–117)
BUN: 12 mg/dL (ref 6–23)
Creat: 0.63 mg/dL (ref 0.50–1.10)
Total Bilirubin: 0.5 mg/dL (ref 0.3–1.2)

## 2012-08-20 LAB — THYROID PANEL WITH TSH
Free Thyroxine Index: 3.1 (ref 1.0–3.9)
T3 Uptake: 29.7 % (ref 22.5–37.0)

## 2012-08-20 MED ORDER — LEVOTHYROXINE SODIUM 50 MCG PO TABS: 50.0000 ug | ORAL_TABLET | Freq: Every day | ORAL | Status: DC

## 2012-08-20 NOTE — Progress Notes (Signed)
  Subjective:    Patient ID: Julia Poole, female    DOB: 18-Nov-1955, 57 y.o.   MRN: [id number]  Hyperlipidemia This is a chronic problem. The current episode started more than 1 year ago. The problem is uncontrolled. Recent lipid tests were reviewed and are high. Exacerbating diseases include hypothyroidism. There are no known factors aggravating her hyperlipidemia. Pertinent negatives include no focal sensory loss, focal weakness, leg pain or myalgias. She is currently on no antihyperlipidemic treatment (Patient was suppose to start on lipitor but atient never has taken.). The current treatment provides moderate improvement of lipids. Compliance problems include adherence to diet and adherence to exercise.   Thyroid Problem Presents for follow-up visit. Patient reports no cold intolerance, constipation, diaphoresis, dry skin, hoarse voice, leg swelling, menstrual problem, nail problem, palpitations, tremors, visual change, weight gain or weight loss. The symptoms have been stable. Her past medical history is significant for hyperlipidemia.  Chronic Low Back Pain Patient sees Dr. Otelia Sergeant- Needs aurgery- currently trying to get disability for back problems. Patinet on pain meds which help.   Review of Systems  Constitutional: Negative for weight loss, weight gain and diaphoresis.  HENT: Negative for hoarse voice.   Cardiovascular: Negative for palpitations.  Gastrointestinal: Negative for constipation.  Endocrine: Negative for cold intolerance.  Genitourinary: Negative for menstrual problem.  Musculoskeletal: Negative for myalgias.  Neurological: Negative for tremors and focal weakness.  All other systems reviewed and are negative.       Objective:   Physical Exam  Constitutional: She is oriented to person, place, and time. She appears well-developed and well-nourished.  HENT:  Nose: Nose normal.  Mouth/Throat: Oropharynx is clear and moist.  Eyes: EOM are normal.  Neck: Trachea normal,  normal range of motion and full passive range of motion without pain. Neck supple. No JVD present. Carotid bruit is not present. No thyromegaly present.  Cardiovascular: Normal rate, regular rhythm, normal heart sounds and intact distal pulses.  Exam reveals no gallop and no friction rub.   No murmur heard. Pulmonary/Chest: Effort normal and breath sounds normal.  Abdominal: Soft. Bowel sounds are normal. She exhibits no distension and no mass. There is no tenderness.  Musculoskeletal: Normal range of motion.  Slowly rises from laying position due to back pain.  Lymphadenopathy:    She has no cervical adenopathy.  Neurological: She is alert and oriented to person, place, and time. She has normal reflexes. She displays normal reflexes.  Skin: Skin is warm and dry.  Psychiatric: She has a normal mood and affect. Her behavior is normal. Judgment and thought content normal.    BP 116/68  Pulse 63  Temp(Src) 97.3 F (36.3 C) (Oral)  Ht 5' 4.5" (1.638 m)  Wt 180 lb (81.647 kg)  BMI 30.43 kg/m2       Assessment & Plan:  1. Hypothyroidism  - levothyroxine (SYNTHROID, LEVOTHROID) 50 MCG tablet; Take 1 tablet (50 mcg total) by mouth daily before breakfast.  Dispense: 30 tablet; Refill: 5 - Thyroid Panel With TSH - COMPLETE METABOLIC PANEL WITH GFR  2. Hyperlipidemia Low fat diet and exercise - NMR Lipoprofile with Lipids  3. Chronic Low back Pain Keep f/u appointments with Dr. Otelia Sergeant [place] [facility] [place] needs [place]  Mary-Margaret Daphine Deutscher, FNP

## 2012-08-20 NOTE — Patient Instructions (Signed)

## 2012-08-25 ENCOUNTER — Other Ambulatory Visit: Payer: Self-pay | Admitting: Nurse Practitioner

## 2012-08-25 LAB — NMR LIPOPROFILE WITH LIPIDS
Cholesterol, Total: 191 mg/dL (ref <200)
LDL (calc): 118 mg/dL — ABNORMAL HIGH (ref <100)
LDL Particle Number: 1890 nmol/L — ABNORMAL HIGH (ref <1000)
LP-IR Score: 89 — ABNORMAL HIGH (ref <=45)
Large VLDL-P: 5.6 nmol/L — ABNORMAL HIGH (ref <=2.7)
Triglycerides: 134 mg/dL (ref <150)
VLDL Size: 59.4 nm — ABNORMAL HIGH (ref <=46.6)

## 2012-08-25 MED ORDER — ROSUVASTATIN CALCIUM 10 MG PO TABS: 10.0000 mg | ORAL_TABLET | Freq: Every day | ORAL | Status: DC

## 2012-09-02 ENCOUNTER — Telehealth: Payer: Self-pay | Admitting: Nurse Practitioner

## 2012-09-03 NOTE — Telephone Encounter (Signed)
Last seen 08/20/12 , Print if approved  And have nurse call [phone]

## 2012-09-03 NOTE — Telephone Encounter (Signed)
Need to see chartp

## 2012-09-04 NOTE — Telephone Encounter (Signed)
Chart on mmm desk- for monday

## 2012-09-07 ENCOUNTER — Telehealth: Payer: Self-pay | Admitting: Nurse Practitioner

## 2012-09-07 NOTE — Telephone Encounter (Signed)
Chart says patient is on hydrocodone- shouldn't need both

## 2012-09-08 NOTE — Telephone Encounter (Signed)
Pt aware of labs and question about meds answered

## 2012-09-08 NOTE — Telephone Encounter (Signed)
patinet cannot have rx for hydrocodone and tylenol #3- one or the other

## 2012-09-08 NOTE — Telephone Encounter (Signed)
Please advise 

## 2012-09-08 NOTE — Telephone Encounter (Signed)
lmtcb- she cant have both meds

## 2012-09-08 NOTE — Progress Notes (Signed)
Pt aware to pick up med and aware of labs

## 2012-09-10 ENCOUNTER — Ambulatory Visit: Payer: Federal, State, Local not specified - PPO | Attending: Specialist | Admitting: Physical Therapy

## 2012-09-10 DIAGNOSIS — M545 Low back pain, unspecified: Secondary | ICD-10-CM | POA: Insufficient documentation

## 2012-09-10 DIAGNOSIS — R5381 Other malaise: Secondary | ICD-10-CM | POA: Insufficient documentation

## 2012-09-10 DIAGNOSIS — IMO0001 Reserved for inherently not codable concepts without codable children: Secondary | ICD-10-CM | POA: Insufficient documentation

## 2012-09-10 DIAGNOSIS — M542 Cervicalgia: Secondary | ICD-10-CM | POA: Insufficient documentation

## 2012-09-14 ENCOUNTER — Ambulatory Visit: Payer: Federal, State, Local not specified - PPO | Admitting: Physical Therapy

## 2012-09-14 ENCOUNTER — Other Ambulatory Visit: Payer: Self-pay | Admitting: Nurse Practitioner

## 2012-09-14 NOTE — Telephone Encounter (Signed)
Lat filled 05/29/12, last seen 08/20/12. Have pt pick up rx

## 2012-09-14 NOTE — Telephone Encounter (Signed)
FYI

## 2012-09-15 MED ORDER — ACETAMINOPHEN-CODEINE #3 300-30 MG PO TABS: 1.0000 | ORAL_TABLET | Freq: Four times a day (QID) | ORAL | Status: DC | PRN

## 2012-09-15 NOTE — Telephone Encounter (Signed)
Have patient pick up rx 

## 2012-09-17 ENCOUNTER — Ambulatory Visit: Payer: Federal, State, Local not specified - PPO | Admitting: Physical Therapy

## 2012-09-21 ENCOUNTER — Ambulatory Visit: Payer: Federal, State, Local not specified - PPO | Admitting: Physical Therapy

## 2012-09-24 ENCOUNTER — Ambulatory Visit: Payer: Federal, State, Local not specified - PPO | Admitting: Physical Therapy

## 2012-09-28 ENCOUNTER — Ambulatory Visit: Payer: Federal, State, Local not specified - PPO | Admitting: Physical Therapy

## 2012-10-01 ENCOUNTER — Ambulatory Visit: Payer: Federal, State, Local not specified - PPO | Attending: Specialist | Admitting: Physical Therapy

## 2012-10-01 DIAGNOSIS — R5381 Other malaise: Secondary | ICD-10-CM | POA: Insufficient documentation

## 2012-10-01 DIAGNOSIS — M545 Low back pain, unspecified: Secondary | ICD-10-CM | POA: Insufficient documentation

## 2012-10-01 DIAGNOSIS — M542 Cervicalgia: Secondary | ICD-10-CM | POA: Insufficient documentation

## 2012-10-01 DIAGNOSIS — IMO0001 Reserved for inherently not codable concepts without codable children: Secondary | ICD-10-CM | POA: Insufficient documentation

## 2012-10-05 ENCOUNTER — Ambulatory Visit: Payer: Federal, State, Local not specified - PPO | Admitting: Physical Therapy

## 2012-10-08 ENCOUNTER — Ambulatory Visit: Payer: Federal, State, Local not specified - PPO | Admitting: Physical Therapy

## 2012-10-12 ENCOUNTER — Ambulatory Visit: Payer: Federal, State, Local not specified - PPO | Admitting: Physical Therapy

## 2012-10-15 ENCOUNTER — Ambulatory Visit: Payer: Federal, State, Local not specified - PPO | Admitting: Physical Therapy

## 2012-10-19 ENCOUNTER — Ambulatory Visit: Payer: Federal, State, Local not specified - PPO | Admitting: Physical Therapy

## 2012-10-22 ENCOUNTER — Ambulatory Visit: Payer: Federal, State, Local not specified - PPO | Admitting: Physical Therapy

## 2012-10-26 ENCOUNTER — Ambulatory Visit: Payer: Federal, State, Local not specified - PPO | Admitting: Physical Therapy

## 2012-10-29 ENCOUNTER — Ambulatory Visit: Payer: Federal, State, Local not specified - PPO | Admitting: Physical Therapy

## 2012-11-02 ENCOUNTER — Ambulatory Visit: Payer: Federal, State, Local not specified - PPO | Attending: Specialist | Admitting: Physical Therapy

## 2012-11-02 DIAGNOSIS — M545 Low back pain, unspecified: Secondary | ICD-10-CM | POA: Insufficient documentation

## 2012-11-02 DIAGNOSIS — IMO0001 Reserved for inherently not codable concepts without codable children: Secondary | ICD-10-CM | POA: Insufficient documentation

## 2012-11-02 DIAGNOSIS — R5381 Other malaise: Secondary | ICD-10-CM | POA: Insufficient documentation

## 2012-11-02 DIAGNOSIS — M542 Cervicalgia: Secondary | ICD-10-CM | POA: Insufficient documentation

## 2012-11-05 ENCOUNTER — Ambulatory Visit: Payer: Federal, State, Local not specified - PPO | Admitting: Physical Therapy

## 2012-11-05 ENCOUNTER — Telehealth: Payer: Self-pay | Admitting: Nurse Practitioner

## 2012-11-05 NOTE — Telephone Encounter (Signed)
First of september

## 2012-11-06 NOTE — Telephone Encounter (Signed)
Detailed message left that she needs to come back the first of September.

## 2012-11-09 ENCOUNTER — Encounter: Payer: Federal, State, Local not specified - PPO | Admitting: Physical Therapy

## 2012-11-10 ENCOUNTER — Ambulatory Visit: Payer: Federal, State, Local not specified - PPO | Admitting: Physical Therapy

## 2012-11-12 ENCOUNTER — Encounter: Payer: Federal, State, Local not specified - PPO | Admitting: Physical Therapy

## 2012-11-13 ENCOUNTER — Ambulatory Visit: Payer: Federal, State, Local not specified - PPO | Admitting: Physical Therapy

## 2012-11-17 ENCOUNTER — Ambulatory Visit: Payer: Federal, State, Local not specified - PPO | Admitting: Physical Therapy

## 2012-11-19 ENCOUNTER — Ambulatory Visit: Payer: Federal, State, Local not specified - PPO | Admitting: Physical Therapy

## 2012-11-23 ENCOUNTER — Ambulatory Visit: Payer: Federal, State, Local not specified - PPO | Admitting: Physical Therapy

## 2012-11-25 ENCOUNTER — Other Ambulatory Visit: Payer: Self-pay

## 2012-11-25 MED ORDER — ACETAMINOPHEN-CODEINE #3 300-30 MG PO TABS: 1.0000 | ORAL_TABLET | ORAL | Status: DC | PRN

## 2012-11-25 NOTE — Telephone Encounter (Signed)
Last seen 08/20/12  MMM    Last filled 09/14/12   If approved print and route to nurse

## 2012-11-25 NOTE — Telephone Encounter (Signed)
rx ready for pickup 

## 2012-11-25 NOTE — Telephone Encounter (Signed)
Pt aware to pick up rx 

## 2012-11-26 ENCOUNTER — Ambulatory Visit: Payer: Federal, State, Local not specified - PPO | Admitting: Physical Therapy

## 2012-12-01 ENCOUNTER — Ambulatory Visit: Payer: Federal, State, Local not specified - PPO | Attending: Specialist | Admitting: Physical Therapy

## 2012-12-01 DIAGNOSIS — R5381 Other malaise: Secondary | ICD-10-CM | POA: Insufficient documentation

## 2012-12-01 DIAGNOSIS — M545 Low back pain, unspecified: Secondary | ICD-10-CM | POA: Insufficient documentation

## 2012-12-01 DIAGNOSIS — M542 Cervicalgia: Secondary | ICD-10-CM | POA: Insufficient documentation

## 2012-12-01 DIAGNOSIS — IMO0001 Reserved for inherently not codable concepts without codable children: Secondary | ICD-10-CM | POA: Insufficient documentation

## 2012-12-07 ENCOUNTER — Ambulatory Visit: Payer: Federal, State, Local not specified - PPO | Admitting: Physical Therapy

## 2012-12-10 ENCOUNTER — Ambulatory Visit: Payer: Federal, State, Local not specified - PPO | Admitting: Physical Therapy

## 2013-01-01 ENCOUNTER — Ambulatory Visit (INDEPENDENT_AMBULATORY_CARE_PROVIDER_SITE_OTHER): Payer: Federal, State, Local not specified - PPO | Admitting: Nurse Practitioner

## 2013-01-01 ENCOUNTER — Encounter: Payer: Self-pay | Admitting: Nurse Practitioner

## 2013-01-01 VITALS — BP 108/64 | HR 66 | Temp 97.4°F | Ht 65.0 in | Wt 182.0 lb

## 2013-01-01 DIAGNOSIS — C50919 Malignant neoplasm of unspecified site of unspecified female breast: Secondary | ICD-10-CM

## 2013-01-01 DIAGNOSIS — Z23 Encounter for immunization: Secondary | ICD-10-CM

## 2013-01-01 DIAGNOSIS — C50911 Malignant neoplasm of unspecified site of right female breast: Secondary | ICD-10-CM

## 2013-01-01 DIAGNOSIS — E785 Hyperlipidemia, unspecified: Secondary | ICD-10-CM

## 2013-01-01 DIAGNOSIS — E039 Hypothyroidism, unspecified: Secondary | ICD-10-CM

## 2013-01-01 NOTE — Patient Instructions (Addendum)

## 2013-01-01 NOTE — Progress Notes (Signed)
  Subjective:    Patient ID: Julia Poole, female    DOB: 09-25-1955, 57 y.o.   MRN: [id number]  Hyperlipidemia This is a chronic problem. The current episode started more than 1 year ago. The problem is uncontrolled. Recent lipid tests were reviewed and are high. Exacerbating diseases include hypothyroidism. Factors aggravating her hyperlipidemia include fatty foods. Pertinent negatives include no focal sensory loss, focal weakness, leg pain or myalgias. Current antihyperlipidemic treatment includes statins (Patient was suppose to start on lipitor but atient never has taken.). The current treatment provides moderate improvement of lipids. Compliance problems include adherence to diet and adherence to exercise.  Risk factors for coronary artery disease include dyslipidemia, post-menopausal and family history.  Thyroid Problem Presents for follow-up visit. Patient reports no cold intolerance, constipation, diaphoresis, dry skin, hoarse voice, leg swelling, menstrual problem, nail problem, palpitations, tremors, visual change, weight gain or weight loss. The symptoms have been stable. Past treatments include levothyroxine. The treatment provided significant relief. Her past medical history is significant for hyperlipidemia.  Chronic Low Back Pain Patient sees Dr. Otelia Sergeant & Dr. Channing Mutters- Slipped vertebra- currently trying to get disability for back problems. Patinet on pain meds which help.   Review of Systems  Constitutional: Negative for weight loss, weight gain and diaphoresis.  HENT: Negative for hoarse voice.   Cardiovascular: Negative for palpitations.  Gastrointestinal: Negative for constipation.  Endocrine: Negative for cold intolerance.  Genitourinary: Negative for menstrual problem.  Musculoskeletal: Positive for back pain. Negative for myalgias.  Neurological: Negative for tremors and focal weakness.  All other systems reviewed and are negative.       Objective:   Physical Exam   Constitutional: She is oriented to person, place, and time. She appears well-developed and well-nourished.  HENT:  Nose: Nose normal.  Mouth/Throat: Oropharynx is clear and moist.  Eyes: EOM are normal.  Neck: Trachea normal, normal range of motion and full passive range of motion without pain. Neck supple. No JVD present. Carotid bruit is not present. No thyromegaly present.  Cardiovascular: Normal rate, regular rhythm, normal heart sounds and intact distal pulses.  Exam reveals no gallop and no friction rub.   No murmur heard. Pulmonary/Chest: Effort normal and breath sounds normal.  Abdominal: Soft. Bowel sounds are normal. She exhibits no distension and no mass. There is no tenderness.  Musculoskeletal: Normal range of motion.  Slowly rises from laying position due to back pain.  Lymphadenopathy:    She has no cervical adenopathy.  Neurological: She is alert and oriented to person, place, and time. She has normal reflexes.  Skin: Skin is warm and dry.  Psychiatric: She has a normal mood and affect. Her behavior is normal. Judgment and thought content normal.    BP 108/64  Pulse 66  Temp(Src) 97.4 F (36.3 C) (Oral)  Ht 5\' 5"  (1.651 m)  Wt 182 lb (82.555 kg)  BMI 30.29 kg/m2       Assessment & Plan:   1. Hypothyroidism   2. Hyperlipidemia   3. Breast cancer, right breast    Orders Placed This Encounter  Procedures  . CMP14+EGFR  . NMR, lipoprofile    Continue all meds Labs pending Diet and exercise encouraged Health maintenance reviewed Follow up in 3 months  Mary-Margaret Daphine Deutscher, FNP

## 2013-01-02 LAB — CMP14+EGFR
Albumin/Globulin Ratio: 2.3 (ref 1.1–2.5)
Albumin: 4.5 g/dL (ref 3.5–5.5)
BUN: 21 mg/dL (ref 6–24)
Chloride: 101 mmol/L (ref 97–108)
GFR calc Af Amer: 111 mL/min/{1.73_m2} (ref >59)
GFR calc non Af Amer: 97 mL/min/{1.73_m2} (ref >59)
Glucose: 86 mg/dL (ref 65–99)
Total Bilirubin: 0.6 mg/dL (ref 0.0–1.2)
Total Protein: 6.5 g/dL (ref 6.0–8.5)

## 2013-01-02 LAB — NMR, LIPOPROFILE
Cholesterol: 129 mg/dL (ref <200)
HDL Cholesterol by NMR: 46 mg/dL (ref >=40)
LDL Particle Number: 1292 nmol/L — ABNORMAL HIGH (ref <1000)
LDL Size: 20.4 nm — ABNORMAL LOW (ref >20.5)
Triglycerides by NMR: 112 mg/dL (ref <150)

## 2013-01-05 ENCOUNTER — Other Ambulatory Visit: Payer: Self-pay | Admitting: Nurse Practitioner

## 2013-01-06 ENCOUNTER — Other Ambulatory Visit: Payer: Self-pay | Admitting: Nurse Practitioner

## 2013-01-06 MED ORDER — ROSUVASTATIN CALCIUM 10 MG PO TABS: 10.0000 mg | ORAL_TABLET | Freq: Every day | ORAL | Status: DC

## 2013-01-21 ENCOUNTER — Telehealth: Payer: Self-pay | Admitting: *Deleted

## 2013-01-21 ENCOUNTER — Other Ambulatory Visit (HOSPITAL_BASED_OUTPATIENT_CLINIC_OR_DEPARTMENT_OTHER): Payer: Federal, State, Local not specified - PPO | Admitting: Lab

## 2013-01-21 ENCOUNTER — Encounter: Payer: Self-pay | Admitting: Family

## 2013-01-21 ENCOUNTER — Ambulatory Visit (HOSPITAL_BASED_OUTPATIENT_CLINIC_OR_DEPARTMENT_OTHER): Admitting: Family

## 2013-01-21 ENCOUNTER — Ambulatory Visit: Payer: Self-pay | Admitting: Oncology

## 2013-01-21 VITALS — BP 132/86 | HR 74 | Temp 98.5°F | Resp 20 | Ht 65.0 in | Wt 183.2 lb

## 2013-01-21 DIAGNOSIS — D0590 Unspecified type of carcinoma in situ of unspecified breast: Secondary | ICD-10-CM

## 2013-01-21 DIAGNOSIS — Z170 Estrogen receptor positive status [ER+]: Secondary | ICD-10-CM

## 2013-01-21 DIAGNOSIS — C50911 Malignant neoplasm of unspecified site of right female breast: Secondary | ICD-10-CM

## 2013-01-21 DIAGNOSIS — N951 Menopausal and female climacteric states: Secondary | ICD-10-CM

## 2013-01-21 DIAGNOSIS — Z853 Personal history of malignant neoplasm of breast: Secondary | ICD-10-CM

## 2013-01-21 LAB — COMPREHENSIVE METABOLIC PANEL (CC13)
AST: 29 U/L (ref 5–34)
Alkaline Phosphatase: 60 U/L (ref 40–150)
BUN: 16.1 mg/dL (ref 7.0–26.0)
Creatinine: 0.8 mg/dL (ref 0.6–1.1)
Potassium: 4.3 mEq/L (ref 3.5–5.1)
Total Bilirubin: 0.57 mg/dL (ref 0.20–1.20)

## 2013-01-21 LAB — CBC WITH DIFFERENTIAL/PLATELET
Basophils Absolute: 0 10*3/uL (ref 0.0–0.1)
EOS%: 4.1 % (ref 0.0–7.0)
HGB: 13.2 g/dL (ref 11.6–15.9)
LYMPH%: 46.2 % (ref 14.0–49.7)
MCH: 30.9 pg (ref 25.1–34.0)
MCV: 92.8 fL (ref 79.5–101.0)
MONO%: 7.6 % (ref 0.0–14.0)
NEUT%: 41.8 % (ref 38.4–76.8)
Platelets: 220 10*3/uL (ref 145–400)
RDW: 12.9 % (ref 11.2–14.5)

## 2013-01-21 MED ORDER — TAMOXIFEN CITRATE 20 MG PO TABS: 20.0000 mg | ORAL_TABLET | Freq: Every day | ORAL | Status: DC

## 2013-01-21 NOTE — Patient Instructions (Signed)
Please contact Julia Poole at [phone] if you have any questions or concerns.  Please continue to do well and enjoy life!!!  Get plenty of rest, drink plenty of water, exercise daily (walking), eat a balanced diet.  Take calcium 600 mg daily in addition to vitamin D3 1000 IUs daily.   Complete monthly self-breast examinations.  Have a clinical breast exam by a physician every year.  Have your mammogram completed every year.  Hot flashes - Peridin-C  Results for orders placed in visit on 01/21/13 (from the past 24 hour(s))  CBC WITH DIFFERENTIAL     Status: None   Collection Time    01/21/13  9:38 AM      Result Value Range   WBC 5.4  3.9 - 10.3 10e3/uL   NEUT# 2.2  1.5 - 6.5 10e3/uL   HGB 13.2  11.6 - 15.9 g/dL   HCT 40.9  81.1 - 91.4 %   Platelets 220  145 - 400 10e3/uL   MCV 92.8  79.5 - 101.0 fL   MCH 30.9  25.1 - 34.0 pg   MCHC 33.3  31.5 - 36.0 g/dL   RBC 7.82  9.56 - 2.13 10e6/uL   RDW 12.9  11.2 - 14.5 %   lymph# 2.5  0.9 - 3.3 10e3/uL   MONO# 0.4  0.1 - 0.9 10e3/uL   Eosinophils Absolute 0.2  0.0 - 0.5 10e3/uL   Basophils Absolute 0.0  0.0 - 0.1 10e3/uL   NEUT% 41.8  38.4 - 76.8 %   LYMPH% 46.2  14.0 - 49.7 %   MONO% 7.6  0.0 - 14.0 %   EOS% 4.1  0.0 - 7.0 %   BASO% 0.3  0.0 - 2.0 %   Narrative:    Performed At:  [facility]               [address]               [place], [place] [address]  COMPREHENSIVE METABOLIC PANEL (CC13)     Status: None   Collection Time    01/21/13  9:39 AM      Result Value Range   Sodium 144  136 - 145 mEq/L   Potassium 4.3  3.5 - 5.1 mEq/L   Chloride 107  98 - 109 mEq/L   CO2 27  22 - 29 mEq/L   Glucose 118  70 - 140 mg/dl   BUN 84.6  7.0 - 96.2 mg/dL   Creatinine 0.8  0.6 - 1.1 mg/dL   Total Bilirubin 9.52  0.20 - 1.20 mg/dL   Alkaline Phosphatase 60  40 - 150 U/L   AST 29  5 - 34 U/L   ALT 29  0 - 55 U/L   Total Protein 6.8  6.4 - 8.3 g/dL   Albumin 3.6  3.5 - 5.0 g/dL   Calcium 9.4  8.4 - 84.1 mg/dL   Anion  Gap 9  3 - 11 mEq/L   Narrative:    Performed At:  [facility]               [address]               [place], [place] [address]

## 2013-01-21 NOTE — Telephone Encounter (Signed)
appts made and printed...td 

## 2013-01-21 NOTE — Progress Notes (Addendum)
[facility]  [phone] [phone]  OFFICE PROGRESS NOTE   ID: Julia Poole   DOB: Aug 26, 1955  MR#: [id number]  JYN#:[id number]   PCP: Rudi Heap, MD SU: Emelia Loron, M.D. ORTHO:  Vira Browns, MD    DIAGNOSIS:  Julia Poole is a 57 y.o. female with high-grade DCIS of the right breast diagnosed in 10/2008.     PRIOR THERAPY: #1 Underwent a lumpectomy in 11/23/2008  that revealed a 0.9 cm DCIS, intermediate grade, with calcifications  tumor was estrogen receptor 100% positive, progesterone receptor 97% positive.  #2 Radiation therapy from 12/21/2008 to 01/31/2009 to a total of 60 gray.  #3 Began Tamoxifen 20 mg daily in 01/2009.   CURRENT THERAPY: Tamoxifen 20 mg daily  INTERVAL HISTORY: Dr. Welton Flakes and I saw Julia Poole 57 y.o. female today for followup of right breast DCIS.  Since her last office visit on 01/20/2012 she states that she has experienced hot flashes, was found to have an elevated cholesterol level and subsequently was started on Crestor, is experiencing chronic back pain and was diagnosed with degenerative disc disease that is currently being treated by Dr. Otelia Sergeant.  Her interval history is otherwise unremarkable and stable.   MEDICAL HISTORY: Past Medical History  Diagnosis Date  . Hemorrhoids   . Bleeding nose   . Sinus problem   . Arthritis   . Allergy   . Cancer     right breast  . Thyroid disease   . Hyperlipidemia   . Degenerative disk disease 2012    ALLERGIES:  has No Known Allergies.  MEDICATIONS:  Current Outpatient Prescriptions  Medication Sig Dispense Refill  . acetaminophen-codeine (TYLENOL #3) 300-30 MG per tablet Take 1 tablet by mouth every 4 (four) hours as needed for pain.  120 tablet  0  . Ascorbic Acid (VITAMIN C) 500 MG tablet Take 500 mg by mouth daily.       Marland Kitchen aspirin 81 MG tablet Take 81 mg by mouth daily.        . Calcium-Vitamin D (CALTRATE 600 PLUS-VIT D PO) Take 600 mg by  mouth 2 (two) times daily.      . Cholecalciferol (VITAMIN D) 2000 UNITS tablet Take 2,000 Units by mouth 2 (two) times daily.      . cyanocobalamin 100 MCG tablet Take 100 mcg by mouth daily.      . fish oil-omega-3 fatty acids 1000 MG capsule Take 1 g by mouth daily.      Marland Kitchen GARLIC PO Take 8,657 mg by mouth daily.      Marland Kitchen levothyroxine (SYNTHROID, LEVOTHROID) 50 MCG tablet Take 1 tablet (50 mcg total) by mouth daily before breakfast.  30 tablet  5  . Multiple Vitamins-Minerals (CENTRUM SILVER PO) Take by mouth.        Marland Kitchen oxaprozin (DAYPRO) 600 MG tablet Take 300 mg by mouth daily.       . rosuvastatin (CRESTOR) 10 MG tablet Take 1 tablet (10 mg total) by mouth daily.  30 tablet  5  . tamoxifen (NOLVADEX) 20 MG tablet Take 1 tablet (20 mg total) by mouth daily.  90 tablet  5   No current facility-administered medications for this visit.    SURGICAL HISTORY:  Past Surgical History  Procedure Laterality Date  . Ganglion cyst excision  SEP. 1997    RIGHT WRIST  . Colonoscopy    . Breast surgery  AUG. 2010    right breast lumpectomy  .  Breast surgery      Cancer    REVIEW OF SYSTEMS:  Pertinent items are noted in HPI.  A 10 point review of systems was completed and is negative except as noted above.  Julia Poole denies any other symptomatology including fatigue, fever or chills, headache, vision changes, swollen glands, cough or shortness of breath, chest pain or discomfort, nausea, vomiting, diarrhea, constipation, change in urinary or bowel habits, any other arthralgias/myalgias, unusual bleeding/bruising or any other symptomatology.   PHYSICAL EXAMINATION: Blood pressure 132/86, pulse 74, temperature 98.5 F (36.9 C), temperature source Oral, resp. rate 20, height 5\' 5"  (1.651 m), weight 183 lb 3.2 oz (83.099 kg). Body mass index is 30.49 kg/(m^2).  ECOG PERFORMANCE STATUS: 1 - Symptomatic but completely ambulatory  General appearance: Alert, cooperative, well nourished, no apparent  distress Head: Normocephalic, without obvious abnormality, atraumatic Eyes: Conjunctivae/corneas clear, PERRLA, EOMI Nose: Nares, septum and mucosa are normal, no drainage or sinus tenderness Neck: No adenopathy, supple, symmetrical, trachea midline, no tenderness Resp: Clear to auscultation bilaterally, no wheezes/rales/rhonchi Cardio: Regular rate and rhythm, S1, S2 normal, no murmur, click, rub or gallop, no edema Breasts:  Pendulous bilaterally, right breast has well-healed surgical scars, right breast is visibly smaller than the left breast, glandular/fibrous breast tissue bilaterally, firm medial and inframammary ridges, bilateral axillary fullness, architectural and radiation changes noted in the right breast, thickened areola area on right breast  GI: Soft, distended, non-tender, hypoactive bowel sounds, no organomegaly Skin: No rashes/lesions, skin warm and dry, no erythematous areas, no cyanosis, numerous nevi on trunk and upper extremities  M/S:  Atraumatic, normal strength in all extremities, normal range of motion, no clubbing, right foot/ankle is wrapped  Lymph nodes: Cervical, supraclavicular, and axillary nodes normal Neurologic: Grossly normal, cranial nerves II through XII intact, alert and oriented x 3 Psych: Appropriate affect    LABORATORY DATA: Lab Results  Component Value Date   WBC 5.4 01/21/2013   HGB 13.2 01/21/2013   HCT 39.4 01/21/2013   MCV 92.8 01/21/2013   PLT 220 01/21/2013      Chemistry      Component Value Date/Time   NA 144 01/21/2013 0939   NA 142 01/01/2013 1058   NA 139 08/20/2012 1038   NA 139 11/07/2009 1355   K 4.3 01/21/2013 0939   K 4.1 01/01/2013 1058   K 4.0 11/07/2009 1355   CL 101 01/01/2013 1058   CL 108* 01/20/2012 0931   CL 102 11/07/2009 1355   CO2 27 01/21/2013 0939   CO2 27 01/01/2013 1058   CO2 29 11/07/2009 1355   BUN 16.1 01/21/2013 0939   BUN 21 01/01/2013 1058   BUN 12 08/20/2012 1038   BUN 13 11/07/2009 1355   CREATININE 0.8  01/21/2013 0939   CREATININE 0.67 01/01/2013 1058   CREATININE 0.63 08/20/2012 1038      Component Value Date/Time   CALCIUM 9.4 01/21/2013 0939   CALCIUM 9.3 01/01/2013 1058   CALCIUM 9.1 11/07/2009 1355   ALKPHOS 60 01/21/2013 0939   ALKPHOS 66 01/01/2013 1058   ALKPHOS 60 11/07/2009 1355   AST 29 01/21/2013 0939   AST 29 01/01/2013 1058   AST 30 11/07/2009 1355   ALT 29 01/21/2013 0939   ALT 32 01/01/2013 1058   ALT 30 11/07/2009 1355   BILITOT 0.57 01/21/2013 0939   BILITOT 0.6 01/01/2013 1058   BILITOT 0.60 11/07/2009 1355       RADIOGRAPHIC STUDIES: Mr Lumbar Spine Wo Contrast 08/05/2012   *  RADIOLOGY REPORT*  Clinical Data: Low back pain.  Bilateral leg pain.  Numbness both feet ongoing for 8 years.  Remote injury.  MRI LUMBAR SPINE WITHOUT CONTRAST  Technique:  Multiplanar and multiecho pulse sequences of the lumbar spine were obtained without intravenous contrast.  Comparison: 01/14/2011.  Findings: Last fully open disc space is labeled L5-S1.  Present examination incorporates from T11-12 disc space through the lower sacrum.  Conus upper L2 level.  Artifact through the distal cord.  Small uterine fibroid.  T11-12 through L1-2 unremarkable.  L2-3:  Minimal bulge.  Minimal facet joint degenerative changes.  L3-4:  Very mild bulge.  Very mild facet joint degenerative changes.  L4-5:  Bulge with small central protrusion.  Indentation upon the ventral aspect of the thecal sac.  Facet joint degenerative changes.  Findings without change.  L5-S1:  Bilateral pars defect.  8 mm anterior slip of L5.  Further progressive disc degeneration L5-S1.  Pseudo disc bulge. Relative projection of the posterior aspect of the S1 vertebral body into the L5-S1 neural foramen. Bilateral foraminal narrowing with encroachment upon exiting L5 nerve roots.  IMPRESSION: Most notable finding once again is at the L5-S1 level where L5 pars defects, anterior slip of L5 and pseudodisc bulge contribute to bilateral foraminal narrowing  and encroachment upon the exiting L5 nerve roots.   L4-5 central disc protrusion without significant change.   Original Report Authenticated By: Lacy Duverney, M.D.     ASSESSMENT: JUSTA HATCHELL is a 57 y.o. female with: #1.  Lumpectomy in 11/23/2008  that revealed a 0.9 cm DCIS, intermediate grade, with calcifications  tumor was estrogen receptor 100% positive, progesterone receptor 97% positive.  #2 Radiation therapy from 12/21/2008 to 01/31/2009.  #3 Tamoxifen 20 mg by mouth daily since 01/2009.   #4 Hot flashes  #5 Chronic back pain due to degenerative disc disease - treated by Dr. Vira Browns    PLAN:  #1 Julia Poole will continue antiestrogen therapy with Tamoxifen 20 mg daily.  The original plan was to continue Tamoxifen for 5 years, however the patient would be more comfortable continuing Tamoxifen for a full 10 years.  An electronic prescription for Tamoxifen 20 mg by mouth daily #90 with 5 refills was sent to her pharmacy today.    #2 Over-the-counter Peridin-C was recommended for her hot flashes.  #3 We plan to see Julia Poole again in one year at which time we will check laboratories of CBC and CMP.  She states that [facility] will schedule her annual mammogram for her.  All questions were answered.  Julia Poole was encouraged to contact us in the interim with any problems, questions or concerns. We can certainly see her much sooner if necessary.   Larina Bras NP-C 01/23/2013, 3:41 PM  ATTENDING'S ATTESTATION:  I personally reviewed patient's chart, examined patient myself, formulated the treatment plan as followed.    Overall patient is doing well. She continues to be on tamoxifen and is tolerating it well without significant side effects except for hot flashes. Patient and I discussed duration of therapy. She understands that she does have DCIS and duration of therapy via standards would be about 5 years. However patient is very concerned about coming off of  tamoxifen. At this time to do to her anxiety and concerns we will continue tamoxifen for a minimum of 10 years. However if she wants to come off of it recently can discuss that as well later time.  Plan is to see  her back in one years time or sooner if need arises.  Drue Second, MD Medical/Oncology [facility] [phone] (beeper) [phone] (Office)  01/24/2013, 9:56 PM

## 2013-03-05 ENCOUNTER — Encounter: Payer: Self-pay | Admitting: Physician Assistant

## 2013-03-05 ENCOUNTER — Ambulatory Visit (INDEPENDENT_AMBULATORY_CARE_PROVIDER_SITE_OTHER): Payer: Federal, State, Local not specified - PPO | Admitting: Physician Assistant

## 2013-03-05 VITALS — BP 120/66 | HR 85 | Temp 98.5°F | Wt 183.2 lb

## 2013-03-05 DIAGNOSIS — J329 Chronic sinusitis, unspecified: Secondary | ICD-10-CM

## 2013-03-05 DIAGNOSIS — B9789 Other viral agents as the cause of diseases classified elsewhere: Secondary | ICD-10-CM

## 2013-03-05 MED ORDER — FLUTICASONE PROPIONATE 50 MCG/ACT NA SUSP: 2.0000 | Freq: Every day | NASAL | Status: DC

## 2013-03-05 NOTE — Patient Instructions (Signed)
Continue with mucinex D. Drink plenty of fluids. Use saline nasal spray prior to using Flonase. Use cold mist humidifier. If high fever develops or s/s continue after 10 days, return to clinic.

## 2013-03-05 NOTE — Progress Notes (Signed)
   Subjective:    Patient ID: Julia Poole, female    DOB: [birth year], 57 y.o.   MRN: [id number]  HPI 57 y/o female presents with nasal congestion and cough x 4 days. Has tried OTC Dayquil, Alka Seltzer cold , saline nasal spray and mucinex D with some relief. Husband and son with similar symptoms.    Review of Systems  Constitutional: Positive for chills and fatigue. Negative for fever and diaphoresis.  HENT: Positive for congestion (nasal), postnasal drip, rhinorrhea, sneezing and sore throat. Negative for drooling, ear discharge, ear pain, facial swelling, hearing loss, mouth sores, nosebleeds, sinus pressure, tinnitus, trouble swallowing and voice change.   Respiratory: Positive for cough (nonproductive) and wheezing (this morning only upon wakening). Negative for apnea, choking, chest tightness, shortness of breath and stridor.   Cardiovascular: Negative.   Gastrointestinal: Negative.   Allergic/Immunologic: Negative for immunocompromised state.  Neurological: Negative for headaches.       Objective:   Physical Exam  Constitutional: She is oriented to person, place, and time. She appears well-developed and well-nourished. No distress.  HENT:  Right Ear: External ear normal.  Left Ear: External ear normal.  Erythematous posterior pharynx bilaterally. No edema  Neck: No thyromegaly present.  Submandibular adenopathy bilaterally. Nontender to palpaation  Cardiovascular: Normal rate, regular rhythm and normal heart sounds.  Exam reveals no gallop and no friction rub.   No murmur heard. Pulmonary/Chest: Effort normal and breath sounds normal. No respiratory distress. She has no wheezes. She has no rales. She exhibits no tenderness.  Neurological: She is alert and oriented to person, place, and time.  Skin: She is not diaphoretic.  Psychiatric: She has a normal mood and affect.          Assessment & Plan:  1. Viral sinusitis: Due to short duration of s/s, I do not feel that  patient needs an antibiotic at this time. I advised her to continue her current tx plan of saline nasal spray and preferably the Musinex D, stopping all other medications. Suggested use of cold mist humidifier. Add flonase nasal spray. Drink plenty of fluids and rest. RTC if s/s worsen or do not improve.

## 2013-03-12 ENCOUNTER — Ambulatory Visit (INDEPENDENT_AMBULATORY_CARE_PROVIDER_SITE_OTHER): Payer: Federal, State, Local not specified - PPO | Admitting: General Practice

## 2013-03-12 ENCOUNTER — Encounter: Payer: Self-pay | Admitting: General Practice

## 2013-03-12 VITALS — BP 110/69 | HR 82 | Temp 97.0°F | Ht 65.0 in | Wt 182.0 lb

## 2013-03-12 DIAGNOSIS — IMO0001 Reserved for inherently not codable concepts without codable children: Secondary | ICD-10-CM

## 2013-03-12 DIAGNOSIS — R350 Frequency of micturition: Secondary | ICD-10-CM

## 2013-03-12 LAB — POCT UA - MICROSCOPIC ONLY
Mucus, UA: NEGATIVE
RBC, urine, microscopic: NEGATIVE
WBC, Ur, HPF, POC: NEGATIVE

## 2013-03-12 LAB — POCT URINALYSIS DIPSTICK
Bilirubin, UA: NEGATIVE
Glucose, UA: NEGATIVE
Ketones, UA: NEGATIVE
Leukocytes, UA: NEGATIVE
Spec Grav, UA: 1.01

## 2013-03-12 NOTE — Progress Notes (Signed)
   Subjective:    Patient ID: Julia Poole, female    DOB: 01/11/1956, 57 y.o.   MRN: [id number]  Urinary Frequency  This is a new problem. The current episode started in the past 7 days. The problem occurs intermittently. The problem has been unchanged. The patient is experiencing no pain. There has been no fever. She is not sexually active. There is no history of pyelonephritis. Associated symptoms include frequency. Pertinent negatives include no chills, discharge, flank pain, hematuria, nausea or urgency. She has tried nothing for the symptoms. There is no history of catheterization or recurrent UTIs.  Reports intermittent periods of urinary frequency, but denies currently. Patient wants urine checked for UTI.     Review of Systems  Constitutional: Negative for fever and chills.  Respiratory: Negative for chest tightness and shortness of breath.   Cardiovascular: Negative for chest pain and palpitations.  Gastrointestinal: Negative for nausea.  Genitourinary: Positive for frequency. Negative for dysuria, urgency, hematuria, flank pain and difficulty urinating.  Neurological: Negative for dizziness, weakness and headaches.       Objective:   Physical Exam  Constitutional: She is oriented to person, place, and time. She appears well-developed and well-nourished.  Cardiovascular: Normal rate, regular rhythm and normal heart sounds.   Pulmonary/Chest: Effort normal and breath sounds normal. No respiratory distress. She exhibits no tenderness.  Abdominal: Soft. Bowel sounds are normal. She exhibits no distension. There is no tenderness.  Neurological: She is alert and oriented to person, place, and time.  Skin: Skin is warm and dry.  Psychiatric: She has a normal mood and affect.     Results for orders placed in visit on 03/12/13  POCT URINALYSIS DIPSTICK      Result Value Range   Color, UA STRAW     Clarity, UA CLOUDY     Glucose, UA NEG     Bilirubin, UA NEG     Ketones, UA  NEG     Spec Grav, UA 1.010     Blood, UA NEG     pH, UA 7.5     Protein, UA NEG     Urobilinogen, UA negative     Nitrite, UA NEG     Leukocytes, UA Negative    POCT UA - MICROSCOPIC ONLY      Result Value Range   WBC, Ur, HPF, POC NEG     RBC, urine, microscopic NEG     Bacteria, U Microscopic NEG     Mucus, UA NEG     Epithelial cells, urine per micros OCC     Crystals, Ur, HPF, POC NEG     Casts, Ur, LPF, POC NEG     Yeast, UA OCC          Assessment & Plan:  1. Frequency - POCT urinalysis dipstick - POCT UA - Microscopic Only -increase fluids -RTO if symptoms return Patient verbalized understanding Coralie Keens, FNP-C

## 2013-03-24 ENCOUNTER — Ambulatory Visit (INDEPENDENT_AMBULATORY_CARE_PROVIDER_SITE_OTHER): Payer: Federal, State, Local not specified - PPO | Admitting: General Practice

## 2013-03-24 VITALS — BP 114/71 | HR 77 | Temp 97.3°F | Ht 65.0 in | Wt 183.0 lb

## 2013-03-24 DIAGNOSIS — R350 Frequency of micturition: Secondary | ICD-10-CM

## 2013-03-24 DIAGNOSIS — N390 Urinary tract infection, site not specified: Secondary | ICD-10-CM

## 2013-03-24 DIAGNOSIS — IMO0001 Reserved for inherently not codable concepts without codable children: Secondary | ICD-10-CM

## 2013-03-24 LAB — POCT URINALYSIS DIPSTICK
Bilirubin, UA: NEGATIVE
Glucose, UA: NEGATIVE
Ketones, UA: NEGATIVE
Nitrite, UA: NEGATIVE
pH, UA: 5

## 2013-03-24 LAB — POCT UA - MICROSCOPIC ONLY
Casts, Ur, LPF, POC: NEGATIVE
Mucus, UA: NEGATIVE

## 2013-03-24 MED ORDER — CIPROFLOXACIN HCL 500 MG PO TABS: 500.0000 mg | ORAL_TABLET | Freq: Two times a day (BID) | ORAL | Status: DC

## 2013-03-24 NOTE — Progress Notes (Signed)
   Subjective:    Patient ID: Julia Poole, female    DOB: 1955/12/10, 57 y.o.   MRN: [id number]  Urinary Tract Infection  This is a new problem. The current episode started yesterday. The problem has been gradually worsening. The quality of the pain is described as aching and burning. The pain is at a severity of 6/10. The patient is experiencing no pain. She is sexually active. There is no history of pyelonephritis. Associated symptoms include flank pain, frequency, nausea and urgency. Pertinent negatives include no chills. She has tried increased fluids for the symptoms. Her past medical history is significant for recurrent UTIs.      Review of Systems  Constitutional: Negative for fever and chills.  Respiratory: Negative for chest tightness, shortness of breath and wheezing.   Cardiovascular: Negative for chest pain and palpitations.  Gastrointestinal: Positive for nausea.  Genitourinary: Positive for urgency, frequency and flank pain.  All other systems reviewed and are negative.       Objective:   Physical Exam  Constitutional: She is oriented to person, place, and time. She appears well-developed and well-nourished.  Cardiovascular: Normal rate, regular rhythm and normal heart sounds.   Pulmonary/Chest: Effort normal and breath sounds normal.  Abdominal: Soft. Bowel sounds are normal. She exhibits no distension. There is tenderness in the suprapubic area. There is no CVA tenderness.  Neurological: She is alert and oriented to person, place, and time.  Skin: Skin is warm and dry.  Psychiatric: She has a normal mood and affect.          Assessment & Plan:  1. Frequency  - POCT urinalysis dipstick - POCT UA - Microscopic Only - Urine culture  2. UTI (urinary tract infection)  - ciprofloxacin (CIPRO) 500 MG tablet; Take 1 tablet (500 mg total) by mouth 2 (two) times daily.  Dispense: 20 tablet; Refill: 0 -Increase fluid intake AZO over the counter X2 days Frequent  voiding Proper perineal hygiene RTO prn Patient verbalized understanding Coralie Keens, FNP-C

## 2013-03-24 NOTE — Patient Instructions (Signed)
Urinary Tract Infection  Urinary tract infections (UTIs) can develop anywhere along your urinary tract. Your urinary tract is your body's drainage system for removing wastes and extra water. Your urinary tract includes two kidneys, two ureters, a bladder, and a urethra. Your kidneys are a pair of bean-shaped organs. Each kidney is about the size of your fist. They are located below your ribs, one on each side of your spine.  CAUSES  Infections are caused by microbes, which are microscopic organisms, including fungi, viruses, and bacteria. These organisms are so small that they can only be seen through a microscope. Bacteria are the microbes that most commonly cause UTIs.  SYMPTOMS   Symptoms of UTIs may vary by age and gender of the patient and by the location of the infection. Symptoms in young women typically include a frequent and intense urge to urinate and a painful, burning feeling in the bladder or urethra during urination. Older women and men are more likely to be tired, shaky, and weak and have muscle aches and abdominal pain. A fever may mean the infection is in your kidneys. Other symptoms of a kidney infection include pain in your back or sides below the ribs, nausea, and vomiting.  DIAGNOSIS  To diagnose a UTI, your caregiver will ask you about your symptoms. Your caregiver also will ask to provide a urine sample. The urine sample will be tested for bacteria and white blood cells. White blood cells are made by your body to help fight infection.  TREATMENT   Typically, UTIs can be treated with medication. Because most UTIs are caused by a bacterial infection, they usually can be treated with the use of antibiotics. The choice of antibiotic and length of treatment depend on your symptoms and the type of bacteria causing your infection.  HOME CARE INSTRUCTIONS   If you were prescribed antibiotics, take them exactly as your caregiver instructs you. Finish the medication even if you feel better after you  have only taken some of the medication.   Drink enough water and fluids to keep your urine clear or pale yellow.   Avoid caffeine, tea, and carbonated beverages. They tend to irritate your bladder.   Empty your bladder often. Avoid holding urine for long periods of time.   Empty your bladder before and after sexual intercourse.   After a bowel movement, women should cleanse from front to back. Use each tissue only once.  SEEK MEDICAL CARE IF:    You have back pain.   You develop a fever.   Your symptoms do not begin to resolve within 3 days.  SEEK IMMEDIATE MEDICAL CARE IF:    You have severe back pain or lower abdominal pain.   You develop chills.   You have nausea or vomiting.   You have continued burning or discomfort with urination.  MAKE SURE YOU:    Understand these instructions.   Will watch your condition.   Will get help right away if you are not doing well or get worse.  Document Released: 12/26/2004 Document Revised: 09/17/2011 Document Reviewed: 04/26/2011  [employer] Patient Information 2014 [employer]

## 2013-03-26 LAB — URINE CULTURE

## 2013-04-01 ENCOUNTER — Other Ambulatory Visit: Payer: Self-pay | Admitting: General Practice

## 2013-04-02 ENCOUNTER — Other Ambulatory Visit: Payer: Self-pay

## 2013-04-02 NOTE — Telephone Encounter (Signed)
Last seen 03/24/13  Julia Poole  If approved print and route to nurse

## 2013-04-07 ENCOUNTER — Telehealth: Payer: Self-pay | Admitting: Nurse Practitioner

## 2013-04-07 MED ORDER — ACETAMINOPHEN-CODEINE #3 300-30 MG PO TABS: 1.0000 | ORAL_TABLET | ORAL | Status: DC | PRN

## 2013-04-07 NOTE — Telephone Encounter (Signed)
rx ready for pickup 

## 2013-04-07 NOTE — Telephone Encounter (Signed)
Patient aware to pick up 

## 2013-06-14 ENCOUNTER — Other Ambulatory Visit: Payer: Self-pay | Admitting: *Deleted

## 2013-06-14 NOTE — Telephone Encounter (Signed)
Last ov 03/24/13. Last refill 04/08/13. Please print if approved.

## 2013-06-17 MED ORDER — ACETAMINOPHEN-CODEINE #3 300-30 MG PO TABS: 1.0000 | ORAL_TABLET | ORAL | Status: DC | PRN

## 2013-06-17 NOTE — Telephone Encounter (Signed)
Sorry rx ready for pick up

## 2013-06-17 NOTE — Telephone Encounter (Signed)
Please call in tylenol #3 with 0 refills  

## 2013-06-18 NOTE — Telephone Encounter (Signed)
Rx up front patient aware . 

## 2013-07-02 ENCOUNTER — Other Ambulatory Visit: Payer: Self-pay | Admitting: Nurse Practitioner

## 2013-07-05 NOTE — Telephone Encounter (Signed)
Patient NTBS for follow up and lab work  

## 2013-07-05 NOTE — Telephone Encounter (Signed)
Last thyroid 5/14. ntbs

## 2013-07-06 ENCOUNTER — Encounter: Payer: Self-pay | Admitting: Nurse Practitioner

## 2013-07-06 ENCOUNTER — Ambulatory Visit (INDEPENDENT_AMBULATORY_CARE_PROVIDER_SITE_OTHER): Payer: Federal, State, Local not specified - PPO | Admitting: Nurse Practitioner

## 2013-07-06 VITALS — BP 111/63 | HR 67 | Temp 96.7°F | Ht 64.0 in | Wt 182.0 lb

## 2013-07-06 DIAGNOSIS — E039 Hypothyroidism, unspecified: Secondary | ICD-10-CM

## 2013-07-06 DIAGNOSIS — E785 Hyperlipidemia, unspecified: Secondary | ICD-10-CM

## 2013-07-06 MED ORDER — LEVOTHYROXINE SODIUM 50 MCG PO TABS: ORAL_TABLET | ORAL | Status: DC

## 2013-07-06 MED ORDER — ROSUVASTATIN CALCIUM 10 MG PO TABS: 10.0000 mg | ORAL_TABLET | Freq: Every day | ORAL | Status: DC

## 2013-07-06 NOTE — Progress Notes (Signed)
Subjective:    Patient ID: Julia Poole, female    DOB: [birth year], 58 y.o.   MRN: [id number]  Patient in today for foloow up of chronic medical problems.- No complaints today.  Hyperlipidemia This is a chronic problem. The current episode started more than 1 year ago. The problem is uncontrolled. Recent lipid tests were reviewed and are high. Exacerbating diseases include hypothyroidism. Factors aggravating her hyperlipidemia include fatty foods. Pertinent negatives include no focal sensory loss, focal weakness, leg pain or myalgias. Current antihyperlipidemic treatment includes statins (Patient was suppose to start on lipitor but atient never has taken.). The current treatment provides moderate improvement of lipids. Compliance problems include adherence to diet and adherence to exercise.  Risk factors for coronary artery disease include dyslipidemia, post-menopausal and family history.  Thyroid Problem Presents for follow-up visit. Patient reports no cold intolerance, constipation, diaphoresis, dry skin, hoarse voice, leg swelling, menstrual problem, nail problem, palpitations, tremors, visual change, weight gain or weight loss. The symptoms have been stable. Past treatments include levothyroxine. The treatment provided significant relief. Her past medical history is significant for hyperlipidemia.  Chronic Low Back Pain Patient sees Dr. Louanne Skye & Dr. Carloyn Manner- Slipped vertebra- currently trying to get disability for back problems. Patinet on pain meds which help.   Review of Systems  Constitutional: Negative for weight loss, weight gain and diaphoresis.  HENT: Negative for hoarse voice.   Cardiovascular: Negative for palpitations.  Gastrointestinal: Negative for constipation.  Endocrine: Negative for cold intolerance.  Genitourinary: Negative for menstrual problem.  Musculoskeletal: Positive for back pain. Negative for myalgias.  Neurological: Negative for tremors and focal weakness.  All other  systems reviewed and are negative.       Objective:   Physical Exam  Constitutional: She is oriented to person, place, and time. She appears well-developed and well-nourished.  HENT:  Nose: Nose normal.  Mouth/Throat: Oropharynx is clear and moist.  Eyes: EOM are normal.  Neck: Trachea normal, normal range of motion and full passive range of motion without pain. Neck supple. No JVD present. Carotid bruit is not present. No thyromegaly present.  Cardiovascular: Normal rate, regular rhythm, normal heart sounds and intact distal pulses.  Exam reveals no gallop and no friction rub.   No murmur heard. Pulmonary/Chest: Effort normal and breath sounds normal.  Abdominal: Soft. Bowel sounds are normal. She exhibits no distension and no mass. There is no tenderness.  Musculoskeletal: Normal range of motion.  Slowly rises from laying position due to back pain.  Lymphadenopathy:    She has no cervical adenopathy.  Neurological: She is alert and oriented to person, place, and time. She has normal reflexes.  Skin: Skin is warm and dry.  Psychiatric: She has a normal mood and affect. Her behavior is normal. Judgment and thought content normal.    BP 111/63  Pulse 67  Temp(Src) 96.7 F (35.9 C) (Oral)  Ht '5\' 4"'  (1.626 m)  Wt 182 lb (82.555 kg)  BMI 31.22 kg/m2       Assessment & Plan:   1. Hyperlipidemia   2. Hypothyroidism    Orders Placed This Encounter  Procedures  . CMP14+EGFR  . NMR, lipoprofile  . Thyroid Panel With TSH   Meds ordered this encounter  Medications  . levothyroxine (SYNTHROID, LEVOTHROID) 50 MCG tablet    Sig: TAKE ONE TABLET BY MOUTH ONE TIME DAILY    Dispense:  90 tablet    Refill:  1    Order Specific Question:  Supervising Provider  Answer:  Chipper Herb [[id number]]  . rosuvastatin (CRESTOR) 10 MG tablet    Sig: Take 1 tablet (10 mg total) by mouth daily.    Dispense:  90 tablet    Refill:  1    Order Specific Question:  Supervising Provider     Answer:  Chipper Herb [[id number]]   hemoccult cards given to patient with directions Labs pending Health maintenance reviewed Diet and exercise encouraged Continue all meds Follow up  In 6 months   [place], FNP

## 2013-07-06 NOTE — Patient Instructions (Signed)

## 2013-07-07 LAB — NMR, LIPOPROFILE
Cholesterol: 107 mg/dL (ref <200)
HDL Cholesterol by NMR: 40 mg/dL (ref >=40)
HDL Particle Number: 32.3 umol/L (ref >=30.5)
LDL PARTICLE NUMBER: 830 nmol/L (ref <1000)
LDL Size: 20.6 nm (ref >20.5)
LDLC SERPL CALC-MCNC: 50 mg/dL (ref <100)
LP-IR SCORE: 71 — AB (ref <=45)
Small LDL Particle Number: 408 nmol/L (ref <=527)
Triglycerides by NMR: 86 mg/dL (ref <150)

## 2013-07-07 LAB — CMP14+EGFR
ALK PHOS: 62 IU/L (ref 39–117)
ALT: 27 IU/L (ref 0–32)
AST: 22 IU/L (ref 0–40)
Albumin/Globulin Ratio: 1.8 (ref 1.1–2.5)
Albumin: 3.9 g/dL (ref 3.5–5.5)
BUN / CREAT RATIO: 26 — AB (ref 9–23)
BUN: 18 mg/dL (ref 6–24)
CHLORIDE: 105 mmol/L (ref 97–108)
CO2: 25 mmol/L (ref 18–29)
Calcium: 8.9 mg/dL (ref 8.7–10.2)
Creatinine, Ser: 0.7 mg/dL (ref 0.57–1.00)
GFR calc Af Amer: 110 mL/min/{1.73_m2} (ref >59)
GFR calc non Af Amer: 95 mL/min/{1.73_m2} (ref >59)
Globulin, Total: 2.2 g/dL (ref 1.5–4.5)
Glucose: 97 mg/dL (ref 65–99)
Potassium: 3.9 mmol/L (ref 3.5–5.2)
SODIUM: 143 mmol/L (ref 134–144)
Total Bilirubin: 0.4 mg/dL (ref 0.0–1.2)
Total Protein: 6.1 g/dL (ref 6.0–8.5)

## 2013-07-07 LAB — THYROID PANEL WITH TSH
FREE THYROXINE INDEX: 2.2 (ref 1.2–4.9)
T3 UPTAKE RATIO: 27 % (ref 24–39)
T4, Total: 8 ug/dL (ref 4.5–12.0)
TSH: 1.64 u[IU]/mL (ref 0.450–4.500)

## 2013-07-08 ENCOUNTER — Other Ambulatory Visit: Payer: Self-pay | Admitting: Gynecology

## 2013-07-16 ENCOUNTER — Ambulatory Visit (INDEPENDENT_AMBULATORY_CARE_PROVIDER_SITE_OTHER): Payer: Federal, State, Local not specified - PPO | Admitting: General Surgery

## 2013-07-29 ENCOUNTER — Encounter (INDEPENDENT_AMBULATORY_CARE_PROVIDER_SITE_OTHER): Payer: Self-pay | Admitting: General Surgery

## 2013-07-29 ENCOUNTER — Ambulatory Visit (INDEPENDENT_AMBULATORY_CARE_PROVIDER_SITE_OTHER): Payer: Federal, State, Local not specified - PPO | Admitting: General Surgery

## 2013-07-29 VITALS — BP 116/72 | HR 82 | Resp 14 | Ht 65.5 in | Wt 181.8 lb

## 2013-07-29 DIAGNOSIS — C50911 Malignant neoplasm of unspecified site of right female breast: Secondary | ICD-10-CM

## 2013-07-29 DIAGNOSIS — C50919 Malignant neoplasm of unspecified site of unspecified female breast: Secondary | ICD-10-CM

## 2013-07-29 NOTE — Patient Instructions (Signed)

## 2013-07-29 NOTE — Progress Notes (Signed)
Subjective:     Patient ID: Julia Poole, female   DOB: 1956/03/27, 58 y.o.   MRN: [id number]  HPI 77 yof s/p right breast lumpectomy in August 2010 for DCIS.  She underwent radiotherapy and is now on tamoxifen.  She has some hot flashes which have been stable for a long time and are tolerable on tamoxifen. She otherwise has no complaints.  She had MM in 8/14 and is due another one year later.   Plan:   She has no clinical evidence of recurrence. Her exam is normal. Her mammogram is up to date. She is due to see medical oncology again in 6 months. I will follow her her up in 1 year. She will get her mammogram in August as scheduled. She is going to do her own self breast exams. I offered her to see plastic surgery to discuss a reduction on the left side for symmetry. She is going to think about that for now.      Review of Systems     Objective:   Physical Exam Physical Exam  Constitutional: She appears well-developed and well-nourished.  Eyes: No scleral icterus.  Pulmonary/Chest: Right breast exhibits no inverted nipple, no mass, no nipple discharge, no skin change and no tenderness. Left breast exhibits no inverted nipple, no mass, no nipple discharge, no skin change and no tenderness. Breasts are asymmetrical (right breast larger than left).    Lymphadenopathy:  She has no cervical adenopathy.     Assessment:     Stage 0 breast cancer     Plan:     She has no clinical evidence of recurrence. Her exam is normal. Her mammogram is up to date. She is due to see medical oncology again in 6 months. She will get her mammogram in August as scheduled. She is going to do her own self breast exams. I offered her to see plastic surgery to discuss a reduction on the left side for symmetry. She is going to think about that for now again. I told her she could now follow up with her gyn and med onc at five years and I would see her back as needed

## 2013-08-30 ENCOUNTER — Other Ambulatory Visit: Payer: Self-pay | Admitting: Family Medicine

## 2013-08-31 NOTE — Telephone Encounter (Signed)
Last seen 07/06/13, last filled 06/22/13

## 2013-08-31 NOTE — Telephone Encounter (Signed)
rx ready for pickup 

## 2013-09-01 NOTE — Telephone Encounter (Signed)
Patient aware.

## 2013-10-18 ENCOUNTER — Encounter: Payer: Self-pay | Admitting: Nurse Practitioner

## 2013-10-18 ENCOUNTER — Ambulatory Visit (INDEPENDENT_AMBULATORY_CARE_PROVIDER_SITE_OTHER): Payer: Federal, State, Local not specified - PPO | Admitting: Nurse Practitioner

## 2013-10-18 VITALS — BP 120/72 | HR 68 | Temp 98.1°F | Ht 65.5 in | Wt 185.0 lb

## 2013-10-18 DIAGNOSIS — Z6830 Body mass index (BMI) 30.0-30.9, adult: Secondary | ICD-10-CM

## 2013-10-18 DIAGNOSIS — E039 Hypothyroidism, unspecified: Secondary | ICD-10-CM

## 2013-10-18 DIAGNOSIS — E785 Hyperlipidemia, unspecified: Secondary | ICD-10-CM

## 2013-10-18 DIAGNOSIS — Z713 Dietary counseling and surveillance: Secondary | ICD-10-CM

## 2013-10-18 MED ORDER — ROSUVASTATIN CALCIUM 10 MG PO TABS: 10.0000 mg | ORAL_TABLET | Freq: Every day | ORAL | Status: DC

## 2013-10-18 NOTE — Patient Instructions (Signed)

## 2013-10-18 NOTE — Progress Notes (Signed)
Subjective:    Patient ID: Julia Poole, female    DOB: 22-Feb-1956, 58 y.o.   MRN: [id number]  Patient in today for foloow up of chronic medical problems.- No complaints today.  Hyperlipidemia This is a chronic problem. The current episode started more than 1 year ago. The problem is uncontrolled. Recent lipid tests were reviewed and are high. Exacerbating diseases include hypothyroidism. Factors aggravating her hyperlipidemia include fatty foods. Pertinent negatives include no focal sensory loss, focal weakness, leg pain or myalgias. Current antihyperlipidemic treatment includes statins (Patient was suppose to start on lipitor but atient never has taken.). The current treatment provides moderate improvement of lipids. Compliance problems include adherence to diet and adherence to exercise.  Risk factors for coronary artery disease include dyslipidemia, post-menopausal and family history.  Thyroid Problem Presents for follow-up visit. Patient reports no cold intolerance, constipation, diaphoresis, dry skin, hoarse voice, leg swelling, menstrual problem, nail problem, palpitations, tremors, visual change, weight gain or weight loss. The symptoms have been stable. Past treatments include levothyroxine. The treatment provided significant relief. Her past medical history is significant for hyperlipidemia.  Chronic Low Back Pain Patient sees Dr. Louanne Skye & Dr. Carloyn Manner- Slipped vertebra- currently trying to get disability for back problems. Patinet on pain meds which help.   Review of Systems  Constitutional: Negative for weight loss, weight gain and diaphoresis.  HENT: Negative for hoarse voice.   Cardiovascular: Negative for palpitations.  Gastrointestinal: Negative for constipation.  Endocrine: Negative for cold intolerance.  Genitourinary: Negative for menstrual problem.  Musculoskeletal: Positive for back pain. Negative for myalgias.  Neurological: Negative for tremors and focal weakness.  All other  systems reviewed and are negative.      Objective:   Physical Exam  Constitutional: She is oriented to person, place, and time. She appears well-developed and well-nourished.  HENT:  Nose: Nose normal.  Mouth/Throat: Oropharynx is clear and moist.  Eyes: EOM are normal.  Neck: Trachea normal, normal range of motion and full passive range of motion without pain. Neck supple. No JVD present. Carotid bruit is not present. No thyromegaly present.  Cardiovascular: Normal rate, regular rhythm, normal heart sounds and intact distal pulses.  Exam reveals no gallop and no friction rub.   No murmur heard. Pulmonary/Chest: Effort normal and breath sounds normal.  Abdominal: Soft. Bowel sounds are normal. She exhibits no distension and no mass. There is no tenderness.  Musculoskeletal: Normal range of motion.  Slowly rises from laying position due to back pain.  Lymphadenopathy:    She has no cervical adenopathy.  Neurological: She is alert and oriented to person, place, and time. She has normal reflexes.  Skin: Skin is warm and dry.  Psychiatric: She has a normal mood and affect. Her behavior is normal. Judgment and thought content normal.    BP 120/72  Pulse 68  Temp(Src) 98.1 F (36.7 C) (Oral)  Ht 5' 5.5" (1.664 m)  Wt 185 lb (83.915 kg)  BMI 30.31 kg/m2       Assessment & Plan:   1. Hypothyroidism, unspecified hypothyroidism type   2. Hyperlipidemia   3. BMI 30.0-30.9,adult   4. Weight loss counseling, encounter for    Orders Placed This Encounter  Procedures  . CMP14+EGFR  . NMR, lipoprofile  . Thyroid Panel With TSH   Meds ordered this encounter  Medications  . rosuvastatin (CRESTOR) 10 MG tablet    Sig: Take 1 tablet (10 mg total) by mouth daily.    Dispense:  90 tablet  Refill:  1    Order Specific Question:  Supervising Provider    Answer:  Chipper Herb [[id number]]    hemoccult cards given to patient with directions Labs pending Health maintenance  reviewed Diet and exercise encouraged Continue all meds Follow up  In 3 months   [place], FNP

## 2013-10-19 ENCOUNTER — Telehealth: Payer: Self-pay | Admitting: Family Medicine

## 2013-10-19 ENCOUNTER — Encounter: Payer: Self-pay | Admitting: Nurse Practitioner

## 2013-10-19 LAB — NMR, LIPOPROFILE
Cholesterol: 122 mg/dL (ref 100–199)
HDL Cholesterol by NMR: 45 mg/dL (ref >39)
HDL PARTICLE NUMBER: 36.6 umol/L (ref >=30.5)
LDL PARTICLE NUMBER: 910 nmol/L (ref <1000)
LDL SIZE: 20.5 nm (ref >20.5)
LDLC SERPL CALC-MCNC: 59 mg/dL (ref 0–99)
LP-IR SCORE: 66 — AB (ref <=45)
Small LDL Particle Number: 405 nmol/L (ref <=527)
Triglycerides by NMR: 89 mg/dL (ref 0–149)

## 2013-10-19 LAB — CMP14+EGFR
ALK PHOS: 59 IU/L (ref 39–117)
ALT: 37 IU/L — ABNORMAL HIGH (ref 0–32)
AST: 34 IU/L (ref 0–40)
Albumin/Globulin Ratio: 2.1 (ref 1.1–2.5)
Albumin: 4.2 g/dL (ref 3.5–5.5)
BILIRUBIN TOTAL: 0.6 mg/dL (ref 0.0–1.2)
BUN / CREAT RATIO: 23 (ref 9–23)
BUN: 15 mg/dL (ref 6–24)
CHLORIDE: 103 mmol/L (ref 97–108)
CO2: 23 mmol/L (ref 18–29)
Calcium: 9.4 mg/dL (ref 8.7–10.2)
Creatinine, Ser: 0.64 mg/dL (ref 0.57–1.00)
GFR calc Af Amer: 113 mL/min/{1.73_m2} (ref >59)
GFR calc non Af Amer: 98 mL/min/{1.73_m2} (ref >59)
GLOBULIN, TOTAL: 2 g/dL (ref 1.5–4.5)
Glucose: 88 mg/dL (ref 65–99)
Potassium: 4.3 mmol/L (ref 3.5–5.2)
SODIUM: 141 mmol/L (ref 134–144)
Total Protein: 6.2 g/dL (ref 6.0–8.5)

## 2013-10-19 LAB — THYROID PANEL WITH TSH
FREE THYROXINE INDEX: 2.3 (ref 1.2–4.9)
T3 Uptake Ratio: 27 % (ref 24–39)
T4, Total: 8.5 ug/dL (ref 4.5–12.0)
TSH: 2.01 u[IU]/mL (ref 0.450–4.500)

## 2013-10-19 NOTE — Telephone Encounter (Signed)
Pt aware of lab results and 2m appt already made

## 2013-10-19 NOTE — Telephone Encounter (Signed)
Message copied by Waverly Ferrari on Tue Oct 19, 2013 12:33 PM ------      Message from: Chevis Pretty      Created: Tue Oct 19, 2013  9:33 AM       Kidney and liver function stable      Cholesterol looks great      Thyroid in normal range      Continue current meds- low fat diet and exercise and recheck in 3 months             ------

## 2013-11-30 ENCOUNTER — Other Ambulatory Visit: Payer: Self-pay | Admitting: *Deleted

## 2013-11-30 MED ORDER — ACETAMINOPHEN-CODEINE #3 300-30 MG PO TABS: ORAL_TABLET | ORAL | Status: DC

## 2013-11-30 NOTE — Telephone Encounter (Signed)
Patient last seen in office on 10-18-13. Rx last filled on 09-01-13 for #120. Please advise.  If approved please print and route to pool B so nurse can call patient to pick up

## 2013-12-25 ENCOUNTER — Telehealth: Payer: Self-pay | Admitting: Hematology and Oncology

## 2014-01-05 ENCOUNTER — Other Ambulatory Visit: Payer: Self-pay | Admitting: *Deleted

## 2014-01-05 ENCOUNTER — Ambulatory Visit: Payer: Federal, State, Local not specified - PPO | Admitting: Nurse Practitioner

## 2014-01-05 DIAGNOSIS — E785 Hyperlipidemia, unspecified: Secondary | ICD-10-CM

## 2014-01-05 MED ORDER — ROSUVASTATIN CALCIUM 10 MG PO TABS: 10.0000 mg | ORAL_TABLET | Freq: Every day | ORAL | Status: DC

## 2014-01-19 ENCOUNTER — Encounter: Payer: Self-pay | Admitting: Nurse Practitioner

## 2014-01-19 ENCOUNTER — Ambulatory Visit (INDEPENDENT_AMBULATORY_CARE_PROVIDER_SITE_OTHER): Payer: Federal, State, Local not specified - PPO | Admitting: Nurse Practitioner

## 2014-01-19 VITALS — BP 120/74 | HR 63 | Temp 97.1°F | Ht 65.5 in | Wt 188.8 lb

## 2014-01-19 DIAGNOSIS — M4310 Spondylolisthesis, site unspecified: Secondary | ICD-10-CM

## 2014-01-19 DIAGNOSIS — E785 Hyperlipidemia, unspecified: Secondary | ICD-10-CM

## 2014-01-19 DIAGNOSIS — E038 Other specified hypothyroidism: Secondary | ICD-10-CM

## 2014-01-19 DIAGNOSIS — Z6830 Body mass index (BMI) 30.0-30.9, adult: Secondary | ICD-10-CM

## 2014-01-19 DIAGNOSIS — Z23 Encounter for immunization: Secondary | ICD-10-CM

## 2014-01-19 DIAGNOSIS — E034 Atrophy of thyroid (acquired): Secondary | ICD-10-CM

## 2014-01-19 MED ORDER — LEVOTHYROXINE SODIUM 50 MCG PO TABS: ORAL_TABLET | ORAL | Status: DC

## 2014-01-19 MED ORDER — ROSUVASTATIN CALCIUM 10 MG PO TABS: 10.0000 mg | ORAL_TABLET | Freq: Every day | ORAL | Status: DC

## 2014-01-19 NOTE — Patient Instructions (Signed)

## 2014-01-19 NOTE — Progress Notes (Signed)
Subjective:    Patient ID: Julia Poole, female    DOB: Oct 28, 1955, 58 y.o.   MRN: [id number]  Patient in today for foloow up of chronic medical problems.- No complaints today.  Hyperlipidemia This is a chronic problem. The current episode started more than 1 year ago. The problem is uncontrolled. Recent lipid tests were reviewed and are high. Exacerbating diseases include hypothyroidism. Factors aggravating her hyperlipidemia include fatty foods. Pertinent negatives include no focal sensory loss, focal weakness, leg pain or myalgias. Current antihyperlipidemic treatment includes statins (Patient was suppose to start on lipitor but atient never has taken.). The current treatment provides moderate improvement of lipids. Compliance problems include adherence to diet and adherence to exercise.  Risk factors for coronary artery disease include dyslipidemia, post-menopausal and family history.  Thyroid Problem Presents for follow-up visit. Patient reports no cold intolerance, constipation, diaphoresis, dry skin, hoarse voice, leg swelling, menstrual problem, nail problem, palpitations, tremors, visual change, weight gain or weight loss. The symptoms have been stable. Past treatments include levothyroxine. The treatment provided significant relief. Her past medical history is significant for hyperlipidemia.  Chronic Low Back Pain/ spondylolisthesis Patient sees Dr. Louanne Skye & Dr. Carloyn Manner- Slipped vertebra- currently trying to get disability for back problems. Patinet on pain meds which help. Says that back is worsening and she may have to resort to surgery.   Review of Systems  Constitutional: Negative for weight loss, weight gain and diaphoresis.  HENT: Negative for hoarse voice.   Cardiovascular: Negative for palpitations.  Gastrointestinal: Negative for constipation.  Endocrine: Negative for cold intolerance.  Genitourinary: Negative for menstrual problem.  Musculoskeletal: Positive for back pain.  Negative for myalgias.  Neurological: Negative for tremors and focal weakness.  All other systems reviewed and are negative.      Objective:   Physical Exam  Constitutional: She is oriented to person, place, and time. She appears well-developed and well-nourished.  HENT:  Nose: Nose normal.  Mouth/Throat: Oropharynx is clear and moist.  Eyes: EOM are normal.  Neck: Trachea normal, normal range of motion and full passive range of motion without pain. Neck supple. No JVD present. Carotid bruit is not present. No thyromegaly present.  Cardiovascular: Normal rate, regular rhythm, normal heart sounds and intact distal pulses.  Exam reveals no gallop and no friction rub.   No murmur heard. Pulmonary/Chest: Effort normal and breath sounds normal.  Abdominal: Soft. Bowel sounds are normal. She exhibits no distension and no mass. There is no tenderness.  Musculoskeletal: Normal range of motion.  Slowly rises from laying position due to back pain.  Lymphadenopathy:    She has no cervical adenopathy.  Neurological: She is alert and oriented to person, place, and time. She has normal reflexes.  Skin: Skin is warm and dry.  Psychiatric: She has a normal mood and affect. Her behavior is normal. Judgment and thought content normal.    BP 120/74  Pulse 63  Temp(Src) 97.1 F (36.2 C) (Oral)  Ht 5' 5.5" (1.664 m)  Wt 188 lb 12.8 oz (85.639 kg)  BMI 30.93 kg/m2       Assessment & Plan:    1. Hypothyroidism due to acquired atrophy of thyroid  - Thyroid Panel With TSH - levothyroxine (SYNTHROID, LEVOTHROID) 50 MCG tablet; TAKE ONE TABLET BY MOUTH ONE TIME DAILY  Dispense: 90 tablet; Refill: 1  2. Hyperlipidemia Low fat diet and exercise - CMP14+EGFR - NMR, lipoprofile - rosuvastatin (CRESTOR) 10 MG tablet; Take 1 tablet (10 mg total) by mouth  daily.  Dispense: 90 tablet; Refill: 0  3. Spondylolisthesis Keep follow up with DR. Roy  4. BMI 30.0-30.9,adult Discussed diet and exercise  for person with BMI >25 Will recheck weight in 3-6 month   Flu shot today Labs pending Health maintenance reviewed Diet and exercise encouraged Continue all meds Follow up  In 3 months   [place], FNP

## 2014-01-20 ENCOUNTER — Encounter: Payer: Self-pay | Admitting: *Deleted

## 2014-01-20 LAB — CMP14+EGFR
A/G RATIO: 1.7 (ref 1.1–2.5)
ALK PHOS: 67 IU/L (ref 39–117)
ALT: 33 IU/L — AB (ref 0–32)
AST: 32 IU/L (ref 0–40)
Albumin: 4 g/dL (ref 3.6–4.8)
BILIRUBIN TOTAL: 0.7 mg/dL (ref 0.0–1.2)
BUN / CREAT RATIO: 21 (ref 11–26)
BUN: 14 mg/dL (ref 8–27)
CALCIUM: 9.2 mg/dL (ref 8.7–10.3)
CO2: 25 mmol/L (ref 18–29)
Chloride: 102 mmol/L (ref 97–108)
Creatinine, Ser: 0.67 mg/dL (ref 0.57–1.00)
GFR, EST AFRICAN AMERICAN: 110 mL/min/{1.73_m2} (ref >59)
GFR, EST NON AFRICAN AMERICAN: 96 mL/min/{1.73_m2} (ref >59)
GLOBULIN, TOTAL: 2.3 g/dL (ref 1.5–4.5)
Glucose: 96 mg/dL (ref 65–99)
POTASSIUM: 4.1 mmol/L (ref 3.5–5.2)
SODIUM: 141 mmol/L (ref 134–144)
Total Protein: 6.3 g/dL (ref 6.0–8.5)

## 2014-01-20 LAB — NMR, LIPOPROFILE
Cholesterol: 123 mg/dL (ref 100–199)
HDL Cholesterol by NMR: 40 mg/dL (ref >39)
HDL PARTICLE NUMBER: 35.1 umol/L (ref >=30.5)
LDL Particle Number: 1049 nmol/L — ABNORMAL HIGH (ref <1000)
LDL SIZE: 20.4 nm (ref >20.5)
LDLC SERPL CALC-MCNC: 69 mg/dL (ref 0–99)
LP-IR SCORE: 69 — AB (ref <=45)
SMALL LDL PARTICLE NUMBER: 505 nmol/L (ref <=527)
Triglycerides by NMR: 68 mg/dL (ref 0–149)

## 2014-01-20 LAB — THYROID PANEL WITH TSH
Free Thyroxine Index: 2.1 (ref 1.2–4.9)
T3 Uptake Ratio: 24 % (ref 24–39)
T4, Total: 8.7 ug/dL (ref 4.5–12.0)
TSH: 1.44 u[IU]/mL (ref 0.450–4.500)

## 2014-01-21 ENCOUNTER — Ambulatory Visit: Payer: Federal, State, Local not specified - PPO | Admitting: Oncology

## 2014-01-21 ENCOUNTER — Other Ambulatory Visit: Payer: Federal, State, Local not specified - PPO

## 2014-01-24 ENCOUNTER — Other Ambulatory Visit: Payer: Self-pay | Admitting: *Deleted

## 2014-01-24 DIAGNOSIS — C50911 Malignant neoplasm of unspecified site of right female breast: Secondary | ICD-10-CM

## 2014-01-25 ENCOUNTER — Ambulatory Visit (HOSPITAL_BASED_OUTPATIENT_CLINIC_OR_DEPARTMENT_OTHER): Payer: Federal, State, Local not specified - PPO | Admitting: Hematology and Oncology

## 2014-01-25 ENCOUNTER — Other Ambulatory Visit (HOSPITAL_BASED_OUTPATIENT_CLINIC_OR_DEPARTMENT_OTHER): Payer: Federal, State, Local not specified - PPO

## 2014-01-25 ENCOUNTER — Telehealth: Payer: Self-pay | Admitting: Hematology and Oncology

## 2014-01-25 VITALS — BP 128/60 | HR 60 | Temp 98.2°F | Resp 18 | Ht 65.0 in | Wt 190.1 lb

## 2014-01-25 DIAGNOSIS — C50911 Malignant neoplasm of unspecified site of right female breast: Secondary | ICD-10-CM

## 2014-01-25 DIAGNOSIS — Z853 Personal history of malignant neoplasm of breast: Secondary | ICD-10-CM

## 2014-01-25 LAB — COMPREHENSIVE METABOLIC PANEL (CC13)
ALT: 37 U/L (ref 0–55)
ANION GAP: 8 meq/L (ref 3–11)
AST: 33 U/L (ref 5–34)
Albumin: 3.7 g/dL (ref 3.5–5.0)
Alkaline Phosphatase: 66 U/L (ref 40–150)
BUN: 18 mg/dL (ref 7.0–26.0)
CALCIUM: 9.6 mg/dL (ref 8.4–10.4)
CHLORIDE: 106 meq/L (ref 98–109)
CO2: 28 meq/L (ref 22–29)
CREATININE: 0.7 mg/dL (ref 0.6–1.1)
Glucose: 104 mg/dl (ref 70–140)
POTASSIUM: 3.9 meq/L (ref 3.5–5.1)
SODIUM: 142 meq/L (ref 136–145)
TOTAL PROTEIN: 6.6 g/dL (ref 6.4–8.3)
Total Bilirubin: 0.62 mg/dL (ref 0.20–1.20)

## 2014-01-25 LAB — CBC WITH DIFFERENTIAL/PLATELET
BASO%: 0.2 % (ref 0.0–2.0)
Basophils Absolute: 0 10*3/uL (ref 0.0–0.1)
EOS%: 3.2 % (ref 0.0–7.0)
Eosinophils Absolute: 0.2 10*3/uL (ref 0.0–0.5)
HEMATOCRIT: 39.9 % (ref 34.8–46.6)
HGB: 13.2 g/dL (ref 11.6–15.9)
LYMPH#: 2.4 10*3/uL (ref 0.9–3.3)
LYMPH%: 41.5 % (ref 14.0–49.7)
MCH: 30.6 pg (ref 25.1–34.0)
MCHC: 33.1 g/dL (ref 31.5–36.0)
MCV: 92.4 fL (ref 79.5–101.0)
MONO#: 0.5 10*3/uL (ref 0.1–0.9)
MONO%: 8.4 % (ref 0.0–14.0)
NEUT#: 2.7 10*3/uL (ref 1.5–6.5)
NEUT%: 46.7 % (ref 38.4–76.8)
Platelets: 224 10*3/uL (ref 145–400)
RBC: 4.32 10*6/uL (ref 3.70–5.45)
RDW: 12.7 % (ref 11.2–14.5)
WBC: 5.7 10*3/uL (ref 3.9–10.3)

## 2014-01-25 MED ORDER — TAMOXIFEN CITRATE 20 MG PO TABS: 20.0000 mg | ORAL_TABLET | Freq: Every day | ORAL | Status: DC

## 2014-01-25 NOTE — Assessment & Plan Note (Signed)
DCIS right breast status post lumpectomy August 2010 ER/PR positive status post radiation therapy and tamoxifen for the past 5 years: Patient wanted to know if she can continue tamoxifen beyond 5 years. I discussed with her that there is no known benefit of tamoxifen beyond 5 years. There are significant risks including 3% risk of endometrial cancer as well as 4% risk of blood clots in women who took tamoxifen for 10 years. Patient understands these risks and would like to continue it for the next couple of years.  Surveillance: Mammograms and August 2015 were reviewed which were normal. Recommended annual mammogram and followup. With routine breast exams.  Survivorship:Discussed the importance of physical exercise in decreasing the likelihood of breast cancer recurrence. Recommended 30 mins daily 6 days a week of either brisk walking or cycling or swimming. Encouraged patient to eat more fruits and vegetables and decrease red meat.

## 2014-01-25 NOTE — Progress Notes (Signed)
Patient Care Team: Chipper Herb, MD as PCP - General (Family Medicine)  DIAGNOSIS: Breast cancer, right breast   Primary site: Breast (Right)   Staging method: AJCC 7th Edition   Pathologic: Stage Unknown (Tis (DCIS))   Summary: Stage Unknown (Tis (DCIS))   CHIEF COMPLIANT: Annual followup of DCIS  INTERVAL HISTORY: Julia Poole is a 58 year old Caucasian with above-mentioned history of DCIS diagnosed 5 years ago status post lumpectomy and radiation and currently on tamoxifen. Is tolerating tamoxifen extremely well without any major problems or concerns. She has in the past discussed with Dr. Humphrey Rolls about continuing tamoxifen beyond 5 years. She would like to do so if possible.  REVIEW OF SYSTEMS:   Constitutional: Denies fevers, chills or abnormal weight loss Eyes: Denies blurriness of vision Ears, nose, mouth, throat, and face: Denies mucositis or sore throat Respiratory: Denies cough, dyspnea or wheezes Cardiovascular: Denies palpitation, chest discomfort or lower extremity swelling Gastrointestinal:  Denies nausea, heartburn or change in bowel habits Skin: Denies abnormal skin rashes Lymphatics: Denies new lymphadenopathy or easy bruising Neurological:Denies numbness, tingling or new weaknesses Behavioral/Psych: Mood is stable, no new changes  Breast:  denies any pain or lumps or nodules in either breasts All other systems were reviewed with the patient and are negative.  I have reviewed the past medical history, past surgical history, social history and family history with the patient and they are unchanged from previous note.  ALLERGIES:  has No Known Allergies.  MEDICATIONS:  Current Outpatient Prescriptions  Medication Sig Dispense Refill  . acetaminophen-codeine (TYLENOL #3) 300-30 MG per tablet TAKE ONE TABLET BY MOUTH EVERY FOUR HOURS AS NEEDED  120 tablet  0  . Ascorbic Acid (VITAMIN C) 500 MG tablet Take 500 mg by mouth daily.       Marland Kitchen aspirin 81 MG tablet Take 81  mg by mouth daily.        . Calcium-Vitamin D (CALTRATE 600 PLUS-VIT D PO) Take 600 mg by mouth 2 (two) times daily.      . Cholecalciferol (VITAMIN D) 2000 UNITS tablet Take 2,000 Units by mouth 2 (two) times daily.      . cyanocobalamin 100 MCG tablet Take 100 mcg by mouth daily.      . fish oil-omega-3 fatty acids 1000 MG capsule Take 1 g by mouth daily.      Marland Kitchen GARLIC PO Take 0,623 mg by mouth daily.      Marland Kitchen levothyroxine (SYNTHROID, LEVOTHROID) 50 MCG tablet TAKE ONE TABLET BY MOUTH ONE TIME DAILY  90 tablet  1  . Multiple Vitamins-Minerals (CENTRUM SILVER PO) Take by mouth.        Marland Kitchen oxaprozin (DAYPRO) 600 MG tablet Take 300 mg by mouth daily.       . rosuvastatin (CRESTOR) 10 MG tablet Take 1 tablet (10 mg total) by mouth daily.  90 tablet  0  . tamoxifen (NOLVADEX) 20 MG tablet Take 1 tablet (20 mg total) by mouth daily.  90 tablet  5   No current facility-administered medications for this visit.    PHYSICAL EXAMINATION: ECOG PERFORMANCE STATUS: 0 - Asymptomatic  Filed Vitals:   01/25/14 1012  BP: 128/60  Pulse: 60  Temp: 98.2 F (36.8 C)  Resp: 18   Filed Weights   01/25/14 1012  Weight: 190 lb 1.6 oz (86.229 kg)    GENERAL:alert, no distress and comfortable SKIN: skin color, texture, turgor are normal, no rashes or significant lesions EYES: normal, Conjunctiva are pink and  non-injected, sclera clear OROPHARYNX:no exudate, no erythema and lips, buccal mucosa, and tongue normal  NECK: supple, thyroid normal size, non-tender, without nodularity LYMPH:  no palpable lymphadenopathy in the cervical, axillary or inguinal LUNGS: clear to auscultation and percussion with normal breathing effort HEART: regular rate & rhythm and no murmurs and no lower extremity edema ABDOMEN:abdomen soft, non-tender and normal bowel sounds Musculoskeletal:no cyanosis of digits and no clubbing  NEURO: alert & oriented x 3 with fluent speech, no focal motor/sensory deficits  LABORATORY DATA:  I  have reviewed the data as listed   Chemistry      Component Value Date/Time   NA 142 01/25/2014 0954   NA 141 01/19/2014 0903   NA 139 08/20/2012 1038   NA 139 11/07/2009 1355   K 3.9 01/25/2014 0954   K 4.1 01/19/2014 0903   K 4.0 11/07/2009 1355   CL 102 01/19/2014 0903   CL 108* 01/20/2012 0931   CL 102 11/07/2009 1355   CO2 28 01/25/2014 0954   CO2 25 01/19/2014 0903   CO2 29 11/07/2009 1355   BUN 18.0 01/25/2014 0954   BUN 14 01/19/2014 0903   BUN 12 08/20/2012 1038   BUN 13 11/07/2009 1355   CREATININE 0.7 01/25/2014 0954   CREATININE 0.67 01/19/2014 0903   CREATININE 0.63 08/20/2012 1038      Component Value Date/Time   CALCIUM 9.6 01/25/2014 0954   CALCIUM 9.2 01/19/2014 0903   CALCIUM 9.1 11/07/2009 1355   ALKPHOS 66 01/25/2014 0954   ALKPHOS 67 01/19/2014 0903   ALKPHOS 60 11/07/2009 1355   AST 33 01/25/2014 0954   AST 32 01/19/2014 0903   AST 30 11/07/2009 1355   ALT 37 01/25/2014 0954   ALT 33* 01/19/2014 0903   ALT 30 11/07/2009 1355   BILITOT 0.62 01/25/2014 0954   BILITOT 0.7 01/19/2014 0903   BILITOT 0.60 11/07/2009 1355       Lab Results  Component Value Date   WBC 5.7 01/25/2014   HGB 13.2 01/25/2014   HCT 39.9 01/25/2014   MCV 92.4 01/25/2014   PLT 224 01/25/2014   NEUTROABS 2.7 01/25/2014     RADIOGRAPHIC STUDIES: I have personally reviewed the radiology reports and agreed with their findings. No results found.   ASSESSMENT & PLAN:  Breast cancer, right breast DCIS right breast status post lumpectomy August 2010 ER/PR positive status post radiation therapy and tamoxifen for the past 5 years: Patient wanted to know if she can continue tamoxifen beyond 5 years. I discussed with her that there is no known benefit of tamoxifen beyond 5 years. There are significant risks including 3% risk of endometrial cancer as well as 4% risk of blood clots in women who took tamoxifen for 10 years. Patient understands these risks and would like to continue it for the next  couple of years.  Surveillance: Mammograms and August 2015 were reviewed which were normal. Recommended annual mammogram and followup. With routine breast exams.  Survivorship:Discussed the importance of physical exercise in decreasing the likelihood of breast cancer recurrence. Recommended 30 mins daily 6 days a week of either brisk walking or cycling or swimming. Encouraged patient to eat more fruits and vegetables and decrease red meat.     Orders Placed This Encounter  Procedures  . CBC with Differential    Standing Status: Future     Number of Occurrences:      Standing Expiration Date: 01/25/2015  . Comprehensive metabolic panel (Cmet) - [facility]  Standing Status: Future     Number of Occurrences:      Standing Expiration Date: 01/25/2015   The patient has a good understanding of the overall plan. she agrees with it. She will call with any problems that may develop before her next visit here.  I spent 20 minutes counseling the patient face to face. The total time spent in the appointment was 25 minutes and more than 50% was on counseling and review of test results    Rulon Eisenmenger, MD 01/25/2014 11:37 AM

## 2014-01-25 NOTE — Telephone Encounter (Signed)
, °

## 2014-02-28 ENCOUNTER — Other Ambulatory Visit: Payer: Self-pay | Admitting: Nurse Practitioner

## 2014-03-01 NOTE — Telephone Encounter (Signed)
Last refill 12/01/13. Last ov 01/19/14. Please route to nurse pool if approved and call pt.

## 2014-03-01 NOTE — Telephone Encounter (Signed)
Please call in tylenol #3 with 0 refills

## 2014-03-02 NOTE — Telephone Encounter (Signed)
Tylenol #3 called into pharmacy 

## 2014-04-02 ENCOUNTER — Other Ambulatory Visit: Payer: Self-pay | Admitting: Nurse Practitioner

## 2014-05-09 ENCOUNTER — Ambulatory Visit (INDEPENDENT_AMBULATORY_CARE_PROVIDER_SITE_OTHER): Payer: Federal, State, Local not specified - PPO | Admitting: Family Medicine

## 2014-05-09 ENCOUNTER — Other Ambulatory Visit: Payer: Self-pay | Admitting: Family Medicine

## 2014-05-09 ENCOUNTER — Encounter: Payer: Self-pay | Admitting: Family Medicine

## 2014-05-09 ENCOUNTER — Telehealth: Payer: Self-pay | Admitting: Family Medicine

## 2014-05-09 VITALS — BP 133/77 | HR 96 | Temp 98.8°F | Ht 65.0 in | Wt 189.4 lb

## 2014-05-09 DIAGNOSIS — J206 Acute bronchitis due to rhinovirus: Secondary | ICD-10-CM

## 2014-05-09 MED ORDER — BENZONATATE 100 MG PO CAPS: 100.0000 mg | ORAL_CAPSULE | Freq: Three times a day (TID) | ORAL | Status: DC | PRN

## 2014-05-09 MED ORDER — AZITHROMYCIN 250 MG PO TABS: ORAL_TABLET | ORAL | Status: DC

## 2014-05-09 MED ORDER — METHYLPREDNISOLONE ACETATE 80 MG/ML IJ SUSP
80.0000 mg | Freq: Once | INTRAMUSCULAR | Status: AC
  Administered 2014-05-09: 80 mg via INTRAMUSCULAR

## 2014-05-09 NOTE — Progress Notes (Signed)
   Subjective:    Patient ID: SAVANHA ISLAND, female    DOB: 12-26-1955, 59 y.o.   MRN: [id number]  HPI Patient is here with c/o uri sx's and cough.  Review of Systems  Constitutional: Negative for fever.  HENT: Negative for ear pain.   Eyes: Negative for discharge.  Respiratory: Negative for cough.   Cardiovascular: Negative for chest pain.  Gastrointestinal: Negative for abdominal distention.  Endocrine: Negative for polyuria.  Genitourinary: Negative for difficulty urinating.  Musculoskeletal: Negative for gait problem and neck pain.  Skin: Negative for color change and rash.  Neurological: Negative for speech difficulty and headaches.  Psychiatric/Behavioral: Negative for agitation.       Objective:    BP 133/77 mmHg  Pulse 96  Temp(Src) 98.8 F (37.1 C) (Oral)  Ht 5\' 5"  (1.651 m)  Wt 189 lb 6 oz (85.9 kg)  BMI 31.51 kg/m2 Physical Exam  Constitutional: She is oriented to person, place, and time. She appears well-developed and well-nourished.  HENT:  Head: Normocephalic and atraumatic.  Mouth/Throat: Oropharynx is clear and moist.  Eyes: Pupils are equal, round, and reactive to light.  Neck: Normal range of motion. Neck supple.  Cardiovascular: Normal rate and regular rhythm.   No murmur heard. Pulmonary/Chest: Effort normal and breath sounds normal.  Abdominal: Soft. Bowel sounds are normal. There is no tenderness.  Neurological: She is alert and oriented to person, place, and time.  Skin: Skin is warm and dry.  Psychiatric: She has a normal mood and affect.          Assessment & Plan:     ICD-9-CM ICD-10-CM   1. Acute bronchitis due to Rhinovirus 466.0 J20.6 azithromycin (ZITHROMAX) 250 MG tablet   079.3  methylPREDNISolone acetate (DEPO-MEDROL) injection 80 mg     benzonatate (TESSALON PERLES) 100 MG capsule   Push po fluids, rest, tylenol and motrin otc prn as directed for fever, arthralgias, and myalgias.  Follow up prn if sx's continue or  persist.  Return if symptoms worsen or fail to improve.  Lysbeth Penner FNP

## 2014-05-09 NOTE — Telephone Encounter (Signed)
zpak and tessalon was sent to her pharmacy

## 2014-05-20 ENCOUNTER — Ambulatory Visit (INDEPENDENT_AMBULATORY_CARE_PROVIDER_SITE_OTHER): Payer: Federal, State, Local not specified - PPO | Admitting: Nurse Practitioner

## 2014-05-20 ENCOUNTER — Encounter: Payer: Self-pay | Admitting: Nurse Practitioner

## 2014-05-20 VITALS — BP 129/69 | HR 77 | Temp 97.3°F | Ht 65.0 in | Wt 188.0 lb

## 2014-05-20 DIAGNOSIS — M549 Dorsalgia, unspecified: Secondary | ICD-10-CM | POA: Diagnosis not present

## 2014-05-20 DIAGNOSIS — J069 Acute upper respiratory infection, unspecified: Secondary | ICD-10-CM

## 2014-05-20 DIAGNOSIS — G8929 Other chronic pain: Secondary | ICD-10-CM | POA: Diagnosis not present

## 2014-05-20 DIAGNOSIS — E038 Other specified hypothyroidism: Secondary | ICD-10-CM

## 2014-05-20 DIAGNOSIS — Z6831 Body mass index (BMI) 31.0-31.9, adult: Secondary | ICD-10-CM | POA: Diagnosis not present

## 2014-05-20 DIAGNOSIS — E785 Hyperlipidemia, unspecified: Secondary | ICD-10-CM | POA: Diagnosis not present

## 2014-05-20 DIAGNOSIS — E034 Atrophy of thyroid (acquired): Secondary | ICD-10-CM

## 2014-05-20 DIAGNOSIS — B9789 Other viral agents as the cause of diseases classified elsewhere: Secondary | ICD-10-CM

## 2014-05-20 MED ORDER — ROSUVASTATIN CALCIUM 10 MG PO TABS: 10.0000 mg | ORAL_TABLET | Freq: Every day | ORAL | Status: DC

## 2014-05-20 NOTE — Progress Notes (Signed)
Subjective:    Patient ID: Julia Poole, female    DOB: 27-Mar-1956, 59 y.o.   MRN: [id number]  Patient in today for foloow up of chronic medical problems.- Her only complaint is she is still having congestion- she has taken a zpak and tessalon perles.  Hyperlipidemia This is a chronic problem. Recent lipid tests were reviewed and are variable. Exacerbating diseases include obesity. She has no history of diabetes or hypothyroidism. Pertinent negatives include no myalgias. Current antihyperlipidemic treatment includes statins. The current treatment provides no improvement of lipids. Compliance problems include adherence to diet and adherence to exercise.   Thyroid Problem Patient reports no cold intolerance, constipation, diaphoresis, menstrual problem, palpitations, tremors or visual change. Her past medical history is significant for hyperlipidemia. There is no history of diabetes.  Chronic Low Back Pain/ spondylolisthesis Patient sees Dr. Louanne Skye & Dr. Carloyn Manner- Slipped vertebra- currently trying to get disability for back problems. Patinet on pain meds which help. Says that back is worsening and she may have to resort to surgery.   Review of Systems  Constitutional: Negative for diaphoresis.  Cardiovascular: Negative for palpitations.  Gastrointestinal: Negative for constipation.  Endocrine: Negative for cold intolerance.  Genitourinary: Negative for menstrual problem.  Musculoskeletal: Positive for back pain. Negative for myalgias.  Neurological: Negative for tremors.  All other systems reviewed and are negative.      Objective:   Physical Exam  Constitutional: She is oriented to person, place, and time. She appears well-developed and well-nourished.  HENT:  Nose: Nose normal.  Mouth/Throat: Oropharynx is clear and moist.  Eyes: EOM are normal.  Neck: Trachea normal, normal range of motion and full passive range of motion without pain. Neck supple. No JVD present. Carotid bruit is not  present. No thyromegaly present.  Cardiovascular: Normal rate, regular rhythm, normal heart sounds and intact distal pulses.  Exam reveals no gallop and no friction rub.   No murmur heard. Pulmonary/Chest: Effort normal and breath sounds normal.  Abdominal: Soft. Bowel sounds are normal. She exhibits no distension and no mass. There is no tenderness.  Musculoskeletal: Normal range of motion.  Slowly rises from laying position due to back pain.  Lymphadenopathy:    She has no cervical adenopathy.  Neurological: She is alert and oriented to person, place, and time. She has normal reflexes.  Skin: Skin is warm and dry.  Psychiatric: She has a normal mood and affect. Her behavior is normal. Judgment and thought content normal.    BP 129/69 mmHg  Pulse 77  Temp(Src) 97.3 F (36.3 C) (Oral)  Ht 5' 5" (1.651 m)  Wt 188 lb (85.276 kg)  BMI 31.28 kg/m2        Assessment & Plan:    1. Hypothyroidism due to acquired atrophy of thyroid - Thyroid Panel With TSH  2. Hyperlipidemia Low fat diet - CMP14+EGFR - NMR, lipoprofile - rosuvastatin (CRESTOR) 10 MG tablet; Take 1 tablet (10 mg total) by mouth daily.  Dispense: 90 tablet; Refill: 1  3. Chronic back pain greater than 3 months duration Back strengthening exercises  4. Viral URI 1. Take meds as prescribed 2. Use a cool mist humidifier especially during the winter months and when heat has been humid. 3. Use saline nose sprays frequently 4. Saline irrigations of the nose can be very helpful if done frequently.  * 4X daily for 1 week*  * Use of a nettie pot can be helpful with this. Follow directions with this* 5. Drink plenty of fluids  6. Keep thermostat turn down low 7.For any cough or congestion  Use plain Mucinex- regular strength or max strength is fine   * Children- consult with Pharmacist for dosing 8. For fever or aces or pains- take tylenol or ibuprofen appropriate for age and weight.  * for fevers greater than 101  orally you may alternate ibuprofen and tylenol every  3 hours.   5. BMI >31 Discussed diet and exercise for person with BMI >25 Will recheck weight in 3-6 months   Labs pending Health maintenance reviewed Diet and exercise encouraged Continue all meds Follow up  In 3 months   [place], FNP

## 2014-05-20 NOTE — Patient Instructions (Signed)

## 2014-05-21 LAB — NMR, LIPOPROFILE
Cholesterol: 143 mg/dL (ref 100–199)
HDL Cholesterol by NMR: 45 mg/dL (ref >39)
HDL Particle Number: 35.5 umol/L (ref >=30.5)
LDL Particle Number: 1331 nmol/L — ABNORMAL HIGH (ref <1000)
LDL Size: 20.6 nm (ref >20.5)
LDL-C: 86 mg/dL (ref 0–99)
LP-IR Score: 82 — ABNORMAL HIGH (ref <=45)
SMALL LDL PARTICLE NUMBER: 468 nmol/L (ref <=527)
Triglycerides by NMR: 60 mg/dL (ref 0–149)

## 2014-05-21 LAB — CMP14+EGFR
ALT: 41 IU/L — AB (ref 0–32)
AST: 34 IU/L (ref 0–40)
Albumin/Globulin Ratio: 1.7 (ref 1.1–2.5)
Albumin: 4.3 g/dL (ref 3.6–4.8)
Alkaline Phosphatase: 72 IU/L (ref 39–117)
BUN / CREAT RATIO: 25 (ref 11–26)
BUN: 16 mg/dL (ref 8–27)
Bilirubin Total: 0.5 mg/dL (ref 0.0–1.2)
CALCIUM: 9.4 mg/dL (ref 8.7–10.3)
CO2: 26 mmol/L (ref 18–29)
Chloride: 102 mmol/L (ref 97–108)
Creatinine, Ser: 0.65 mg/dL (ref 0.57–1.00)
GFR, EST AFRICAN AMERICAN: 112 mL/min/{1.73_m2} (ref >59)
GFR, EST NON AFRICAN AMERICAN: 97 mL/min/{1.73_m2} (ref >59)
GLOBULIN, TOTAL: 2.5 g/dL (ref 1.5–4.5)
Glucose: 94 mg/dL (ref 65–99)
Potassium: 4.7 mmol/L (ref 3.5–5.2)
SODIUM: 142 mmol/L (ref 134–144)
Total Protein: 6.8 g/dL (ref 6.0–8.5)

## 2014-05-21 LAB — THYROID PANEL WITH TSH
Free Thyroxine Index: 2.5 (ref 1.2–4.9)
T3 Uptake Ratio: 27 % (ref 24–39)
T4, Total: 9.4 ug/dL (ref 4.5–12.0)
TSH: 1.89 u[IU]/mL (ref 0.450–4.500)

## 2014-06-03 ENCOUNTER — Other Ambulatory Visit: Payer: Self-pay | Admitting: Nurse Practitioner

## 2014-06-04 NOTE — Telephone Encounter (Signed)
Pt requesting refill on Tylenol #3, last seen 05/20/14, last ordered 03/01/14 #120 1tab PO Q4hours prn no refills.

## 2014-06-05 NOTE — Telephone Encounter (Signed)
rx ready for pickup 

## 2014-06-06 NOTE — Telephone Encounter (Signed)
Left message rx ready for pick up

## 2014-06-15 ENCOUNTER — Telehealth: Payer: Self-pay | Admitting: *Deleted

## 2014-06-15 NOTE — Telephone Encounter (Signed)
On 06-15-14 fax medical records to [facility] it was consult note, end of tx note, sim & planning note, follow up note 07-25-09, follow up note 02-28-09 Dr. Rolena Infante was the dr, check with Dr. Valere Dross he ok

## 2014-06-30 ENCOUNTER — Other Ambulatory Visit: Payer: Self-pay | Admitting: Nurse Practitioner

## 2014-08-19 ENCOUNTER — Ambulatory Visit: Payer: Federal, State, Local not specified - PPO | Admitting: Nurse Practitioner

## 2014-09-15 ENCOUNTER — Telehealth: Payer: Self-pay | Admitting: *Deleted

## 2014-09-15 NOTE — Telephone Encounter (Signed)
Patient called and left message.  She experienced vaginal bleeding today - bright red, 2-3 tablespoons.  She called her GYN - Dr. Deatra Ina and he will see her this coming Wednesday for an ultrasound.  Patient is wondering if it would be ok to go ahead and stop her tamoxifen.  Called patient back.  Let her know that she did the correct thing in calling Dr. Deatra Ina.  Let her know I reviewed Dr. Geralyn Flash note from her visiit on 01-25-14 and his advice was that there was no benefit to continuing tamoxifen after 5 years.(Patient has been on this longer than 5 years).   Patient states that when she saw Dr. Deatra Ina this April, he also told her that there was no benefit in continuing.  Let her know that it is fine to stop tamoxifen today and not continue.  She will call Dr. Deatra Ina if she has concerns about the amount of bleeding she is experiencing before Wednesday.  She very much appreciated the prompt return of her call and this information.

## 2014-09-21 DIAGNOSIS — N950 Postmenopausal bleeding: Secondary | ICD-10-CM | POA: Diagnosis not present

## 2014-09-29 ENCOUNTER — Other Ambulatory Visit: Payer: Self-pay | Admitting: Nurse Practitioner

## 2014-09-29 ENCOUNTER — Other Ambulatory Visit: Payer: Self-pay | Admitting: Family Medicine

## 2014-09-29 NOTE — Telephone Encounter (Signed)
Last seen 05/20/14 MMM  If approved print

## 2014-09-29 NOTE — Telephone Encounter (Signed)
rx ready for pickup 

## 2014-09-29 NOTE — Telephone Encounter (Signed)
Pt aware written Rx is at front desk ready for pickup  

## 2014-10-06 ENCOUNTER — Encounter: Payer: Self-pay | Admitting: Gastroenterology

## 2014-10-06 ENCOUNTER — Ambulatory Visit (INDEPENDENT_AMBULATORY_CARE_PROVIDER_SITE_OTHER): Payer: Medicare Other | Admitting: Nurse Practitioner

## 2014-10-06 ENCOUNTER — Encounter: Payer: Self-pay | Admitting: Nurse Practitioner

## 2014-10-06 VITALS — BP 123/73 | HR 75 | Temp 97.6°F | Ht 65.0 in | Wt 192.0 lb

## 2014-10-06 DIAGNOSIS — E038 Other specified hypothyroidism: Secondary | ICD-10-CM

## 2014-10-06 DIAGNOSIS — E785 Hyperlipidemia, unspecified: Secondary | ICD-10-CM

## 2014-10-06 DIAGNOSIS — Z6831 Body mass index (BMI) 31.0-31.9, adult: Secondary | ICD-10-CM | POA: Diagnosis not present

## 2014-10-06 DIAGNOSIS — Z1212 Encounter for screening for malignant neoplasm of rectum: Secondary | ICD-10-CM

## 2014-10-06 DIAGNOSIS — M549 Dorsalgia, unspecified: Secondary | ICD-10-CM | POA: Diagnosis not present

## 2014-10-06 DIAGNOSIS — G8929 Other chronic pain: Secondary | ICD-10-CM

## 2014-10-06 DIAGNOSIS — E034 Atrophy of thyroid (acquired): Secondary | ICD-10-CM | POA: Diagnosis not present

## 2014-10-06 MED ORDER — LEVOTHYROXINE SODIUM 50 MCG PO TABS: 50.0000 ug | ORAL_TABLET | Freq: Every day | ORAL | Status: DC

## 2014-10-06 MED ORDER — ROSUVASTATIN CALCIUM 10 MG PO TABS: 10.0000 mg | ORAL_TABLET | Freq: Every day | ORAL | Status: DC

## 2014-10-06 NOTE — Progress Notes (Signed)
Subjective:    Patient ID: Julia Poole, female    DOB: 1955-09-02, 59 y.o.   MRN: [id number]  Patient in today for foloow up of chronic medical problems. No complaints today. Hyperlipidemia This is a chronic problem. Recent lipid tests were reviewed and are variable. Exacerbating diseases include obesity. She has no history of diabetes or hypothyroidism. Pertinent negatives include no myalgias. Current antihyperlipidemic treatment includes statins. The current treatment provides no improvement of lipids. Compliance problems include adherence to diet and adherence to exercise.   Thyroid Problem Patient reports no cold intolerance, constipation, diaphoresis, menstrual problem, palpitations, tremors or visual change. Her past medical history is significant for hyperlipidemia. There is no history of diabetes.  Chronic Low Back Pain/ spondylolisthesis Patient sees Dr. Louanne Skye & Dr. Carloyn Manner- Slipped vertebra- currently trying to get disability for back problems. Patinet on pain meds which help. Says that back is worsening and she may have to resort to surgery.   Review of Systems  Constitutional: Negative for diaphoresis.  Cardiovascular: Negative for palpitations.  Gastrointestinal: Negative for constipation.  Endocrine: Negative for cold intolerance.  Genitourinary: Negative for menstrual problem.  Musculoskeletal: Positive for back pain. Negative for myalgias.  Neurological: Negative for tremors.  All other systems reviewed and are negative.      Objective:   Physical Exam  Constitutional: She is oriented to person, place, and time. She appears well-developed and well-nourished.  HENT:  Nose: Nose normal.  Mouth/Throat: Oropharynx is clear and moist.  Eyes: EOM are normal.  Neck: Trachea normal, normal range of motion and full passive range of motion without pain. Neck supple. No JVD present. Carotid bruit is not present. No thyromegaly present.  Cardiovascular: Normal rate, regular rhythm,  normal heart sounds and intact distal pulses.  Exam reveals no gallop and no friction rub.   No murmur heard. Pulmonary/Chest: Effort normal and breath sounds normal.  Abdominal: Soft. Bowel sounds are normal. She exhibits no distension and no mass. There is no tenderness.  Musculoskeletal: Normal range of motion.  Slowly rises from laying position due to back pain.  Lymphadenopathy:    She has no cervical adenopathy.  Neurological: She is alert and oriented to person, place, and time. She has normal reflexes.  Skin: Skin is warm and dry.  Psychiatric: She has a normal mood and affect. Her behavior is normal. Judgment and thought content normal.    BP 123/73 mmHg  Pulse 75  Temp(Src) 97.6 F (36.4 C) (Oral)  Ht _0  (1.651 m)  Wt 192 lb (87.091 kg)  BMI 31.95 kg/m2        Assessment & Plan:  1. Hypothyroidism due to acquired atrophy of thyroid - levothyroxine (SYNTHROID, LEVOTHROID) 50 MCG tablet; Take 1 tablet (50 mcg total) by mouth daily.  Dispense: 90 tablet; Refill: 2 - Thyroid Panel With TSH  2. Hyperlipidemia Low fat diet - rosuvastatin (CRESTOR) 10 MG tablet; Take 1 tablet (10 mg total) by mouth daily.  Dispense: 90 tablet; Refill: 1 - CMP14+EGFR - Lipid panel  3. Chronic back pain greater than 3 months duration Keep folllow up with neurosurgeon  4. BMI 31.0-31.9,adult Discussed diet and exercise for person with BMI >25 Will recheck weight in 3-6 months   5. Screening for malignant neoplasm of the rectum hemoccult cards given to patient with directions - Fecal occult blood, imunochemical; Future    Labs pending Health maintenance reviewed Diet and exercise encouraged Continue all meds Follow up  In 6 months   Mary-Margaret  Hassell Done, [place]

## 2014-10-06 NOTE — Patient Instructions (Signed)

## 2014-10-07 LAB — CMP14+EGFR
ALBUMIN: 4.4 g/dL (ref 3.6–4.8)
ALK PHOS: 67 IU/L (ref 39–117)
ALT: 47 IU/L — AB (ref 0–32)
AST: 44 IU/L — ABNORMAL HIGH (ref 0–40)
Albumin/Globulin Ratio: 2 (ref 1.1–2.5)
BILIRUBIN TOTAL: 0.5 mg/dL (ref 0.0–1.2)
BUN/Creatinine Ratio: 20 (ref 11–26)
BUN: 14 mg/dL (ref 8–27)
CALCIUM: 9.5 mg/dL (ref 8.7–10.3)
CO2: 26 mmol/L (ref 18–29)
CREATININE: 0.7 mg/dL (ref 0.57–1.00)
Chloride: 104 mmol/L (ref 97–108)
GFR calc Af Amer: 109 mL/min/{1.73_m2} (ref >59)
GFR, EST NON AFRICAN AMERICAN: 94 mL/min/{1.73_m2} (ref >59)
GLUCOSE: 99 mg/dL (ref 65–99)
Globulin, Total: 2.2 g/dL (ref 1.5–4.5)
Potassium: 4.6 mmol/L (ref 3.5–5.2)
Sodium: 145 mmol/L — ABNORMAL HIGH (ref 134–144)
TOTAL PROTEIN: 6.6 g/dL (ref 6.0–8.5)

## 2014-10-07 LAB — LIPID PANEL
CHOLESTEROL TOTAL: 134 mg/dL (ref 100–199)
Chol/HDL Ratio: 3.1 ratio units (ref 0.0–4.4)
HDL: 43 mg/dL (ref >39)
LDL CALC: 74 mg/dL (ref 0–99)
TRIGLYCERIDES: 83 mg/dL (ref 0–149)
VLDL CHOLESTEROL CAL: 17 mg/dL (ref 5–40)

## 2014-10-07 LAB — THYROID PANEL WITH TSH
Free Thyroxine Index: 2.3 (ref 1.2–4.9)
T3 UPTAKE RATIO: 25 % (ref 24–39)
T4, Total: 9.1 ug/dL (ref 4.5–12.0)
TSH: 3.08 u[IU]/mL (ref 0.450–4.500)

## 2014-10-13 ENCOUNTER — Other Ambulatory Visit: Payer: Self-pay

## 2014-10-13 DIAGNOSIS — N858 Other specified noninflammatory disorders of uterus: Secondary | ICD-10-CM | POA: Diagnosis not present

## 2014-10-13 DIAGNOSIS — N950 Postmenopausal bleeding: Secondary | ICD-10-CM | POA: Diagnosis not present

## 2014-11-24 ENCOUNTER — Telehealth: Payer: Self-pay | Admitting: *Deleted

## 2014-11-24 NOTE — Telephone Encounter (Addendum)
Original voicemail routed to collaborative nurse voicemail who handles appointment questions.  "I am a patient of Dr. Geralyn Flash.  I have questions about the possible appointment I have in October.  Please call cell [phone] or home [phone] for details as I do not know if I need to come in at all."

## 2014-11-24 NOTE — Telephone Encounter (Signed)
Returned call to patient.  She reports problems with bleeding and a poly.  Saw her gyn - did a d&C and biopsy.  Results were fine.  At that time - app 2.5 months ago - patient stopped taking tamoxifen.  She wants to know if she needs to continue with the cancer center.  Per Dr. Geralyn Flash notes, pt has completed course of therapy with Tamoxifen.  Let pt know she no longer needs to come to the clinic as long as she is having her annual breast exams done.  Pt confirms she has these done by her gyn at [facility] annually.  Let pt know I would cancel her Oct appts and to call us if she needed [place].  Pt voiced understanding.

## 2014-11-30 DIAGNOSIS — Z853 Personal history of malignant neoplasm of breast: Secondary | ICD-10-CM | POA: Diagnosis not present

## 2014-12-06 DIAGNOSIS — M4317 Spondylolisthesis, lumbosacral region: Secondary | ICD-10-CM | POA: Diagnosis not present

## 2014-12-06 DIAGNOSIS — M4316 Spondylolisthesis, lumbar region: Secondary | ICD-10-CM | POA: Diagnosis not present

## 2014-12-06 DIAGNOSIS — M47816 Spondylosis without myelopathy or radiculopathy, lumbar region: Secondary | ICD-10-CM | POA: Diagnosis not present

## 2014-12-19 ENCOUNTER — Other Ambulatory Visit: Payer: Self-pay | Admitting: Nurse Practitioner

## 2014-12-19 NOTE — Telephone Encounter (Signed)
Last filled 09/29/14, last seen 10/06/14. Rx will print

## 2014-12-25 ENCOUNTER — Other Ambulatory Visit: Payer: Self-pay | Admitting: Nurse Practitioner

## 2014-12-28 ENCOUNTER — Encounter: Payer: Self-pay | Admitting: Pediatrics

## 2014-12-28 ENCOUNTER — Ambulatory Visit (INDEPENDENT_AMBULATORY_CARE_PROVIDER_SITE_OTHER): Payer: Medicare Other | Admitting: Pediatrics

## 2014-12-28 VITALS — BP 109/69 | HR 71 | Temp 97.3°F | Ht 65.0 in | Wt 195.8 lb

## 2014-12-28 DIAGNOSIS — J069 Acute upper respiratory infection, unspecified: Secondary | ICD-10-CM

## 2014-12-28 NOTE — Patient Instructions (Addendum)
Sinus rinse tea pot, or netipot Ibuprofen 600mg  three times a day Keep water with you, stay hydrated.

## 2014-12-28 NOTE — Progress Notes (Signed)
Subjective:    Patient ID: Julia Poole, female    DOB: [birth year], 59 y.o.   MRN: [id number]  HPI: KATHLEEN TAMM is a 59 y.o. female presenting on 12/28/2014 for Nasal Congestion; Cough; and Shortness of Breath  Started having cough last week, approx 6 days ago. Feels worse than when it started. Has some sore throat. No nasal congestion. Not able to breathe when laying down at night. Able to get up and move around fine without getting SOB.   No lung problems. No h/o smoking. Not on inhlaers.   Has been taking nasal saline mist some, gargling with salt water, mucinex x1.    Relevant past medical, surgical, family and social history reviewed and updated as indicated. Interim medical history since our last visit reviewed. Allergies and medications reviewed and updated.   ROS: Per HPI unless specifically indicated above  Past Medical History Patient Active Problem List   Diagnosis Date Noted  . BMI 31.0-31.9,adult 05/20/2014  . Spondylolisthesis 08/03/2012  . Chronic back pain greater than 3 months duration 08/03/2012  . Hyperlipidemia 08/03/2012  . Hypothyroidism 08/03/2012  . Breast cancer, right breast 05/16/2011    Current Outpatient Prescriptions  Medication Sig Dispense Refill  . acetaminophen-codeine (TYLENOL #3) 300-30 MG per tablet TAKE ONE TABLET BY MOUTH EVERY FOUR HOURS AS NEEDED FOR PAIN 120 tablet 0  . Ascorbic Acid (VITAMIN C) 500 MG tablet Take 500 mg by mouth daily.     Marland Kitchen aspirin 81 MG tablet Take 81 mg by mouth daily.      . Calcium-Vitamin D (CALTRATE 600 PLUS-VIT D PO) Take 600 mg by mouth 2 (two) times daily.    . Cholecalciferol (VITAMIN D) 2000 UNITS tablet Take 2,000 Units by mouth 2 (two) times daily.    . CRESTOR 10 MG tablet TAKE ONE TABLET BY MOUTH ONE  TIME DAILY 90 tablet 0  . cyanocobalamin 100 MCG tablet Take 100 mcg by mouth daily.    . fish oil-omega-3 fatty acids 1000 MG capsule Take 1 g by mouth daily.    Marland Kitchen GARLIC PO Take 8,768 mg by  mouth daily.    Marland Kitchen levothyroxine (SYNTHROID, LEVOTHROID) 50 MCG tablet Take 1 tablet (50 mcg total) by mouth daily. 90 tablet 2  . Multiple Vitamins-Minerals (CENTRUM SILVER PO) Take by mouth.      Marland Kitchen oxaprozin (DAYPRO) 600 MG tablet Take 300 mg by mouth daily.      No current facility-administered medications for this visit.       Objective:    BP 109/69 mmHg  Pulse 71  Temp(Src) 97.3 F (36.3 C) (Oral)  Ht 5\' 5"  (1.651 m)  Wt 195 lb 12.8 oz (88.814 kg)  BMI 32.58 kg/m2  SpO2 100%  Wt Readings from Last 3 Encounters:  12/28/14 195 lb 12.8 oz (88.814 kg)  10/06/14 192 lb (87.091 kg)  05/20/14 188 lb (85.276 kg)     Gen: NAD, alert, cooperative with exam, NCAT EYES: EOMI, no scleral injection or icterus ENT:  TMs no purulence, some clear fluid behind TM, OP without erythema LYMPH: no cervical LAD CV: NRRR, normal S1/S2, no murmur, DP pulses 2+ b/l Resp: CTABL, no wheezes, normal WOB Abd: +BS, soft, NTND. no guarding or organomegaly Neuro: Alert and oriented, strength equal b/l UE and LE, coodrination grossly normal MSK: normal muscle bulk     Assessment & Plan:   Khamil was seen today for acute URI. Symptoms for 5 days. Exam reassuring. Rec symptomatic  care.  Diagnoses and all orders for this visit:  Acute URI  Follow up plan: As needed if symptoms dont improve  Assunta Found, MD [facility] 12/28/2014, 9:16 AM

## 2014-12-30 ENCOUNTER — Telehealth: Payer: Self-pay | Admitting: Nurse Practitioner

## 2014-12-30 DIAGNOSIS — R062 Wheezing: Secondary | ICD-10-CM

## 2014-12-30 MED ORDER — AZITHROMYCIN 250 MG PO TABS: ORAL_TABLET | ORAL | Status: DC

## 2014-12-30 MED ORDER — ALBUTEROL SULFATE HFA 108 (90 BASE) MCG/ACT IN AERS: 2.0000 | INHALATION_SPRAY | Freq: Four times a day (QID) | RESPIRATORY_TRACT | Status: DC | PRN

## 2014-12-30 MED ORDER — SPACER/AERO CHAMBER MOUTHPIECE MISC: Status: DC

## 2014-12-30 NOTE — Telephone Encounter (Signed)
Pt still with cough. Feels like her breathing is easy most of the time, unless she is coughing then it feels "tight". Sinus congestion is the same, no worse but no better. Discussed albuterol use with spacer, sent that in as well as azithromycin. Return precautions given.

## 2015-01-25 DIAGNOSIS — H2513 Age-related nuclear cataract, bilateral: Secondary | ICD-10-CM | POA: Diagnosis not present

## 2015-01-25 DIAGNOSIS — H5213 Myopia, bilateral: Secondary | ICD-10-CM | POA: Diagnosis not present

## 2015-01-25 DIAGNOSIS — H04123 Dry eye syndrome of bilateral lacrimal glands: Secondary | ICD-10-CM | POA: Diagnosis not present

## 2015-01-26 ENCOUNTER — Other Ambulatory Visit: Payer: Federal, State, Local not specified - PPO

## 2015-01-26 ENCOUNTER — Ambulatory Visit: Payer: Federal, State, Local not specified - PPO | Admitting: Hematology and Oncology

## 2015-02-04 ENCOUNTER — Other Ambulatory Visit: Payer: Self-pay | Admitting: Nurse Practitioner

## 2015-02-06 NOTE — Telephone Encounter (Signed)
Patient of MMM. Recently saw you for an acute visit. Last chronic visit was 09-30-14. Rx last filled on 12-21-14 for #120. Please advise. If approved rx will print. Route to Pool B so nurse can call patient to pick up

## 2015-02-06 NOTE — Telephone Encounter (Signed)
We did not discuss her pain at the acute visit, I would need to see her in clinic to refill. Copying MMM on this as well.

## 2015-02-07 ENCOUNTER — Telehealth: Payer: Self-pay | Admitting: Nurse Practitioner

## 2015-02-07 NOTE — Telephone Encounter (Signed)
Detailed message left for patient that she will need to be seen  

## 2015-02-08 NOTE — Telephone Encounter (Signed)
Pt given appt for 11/10 at 11:15.

## 2015-02-08 NOTE — Telephone Encounter (Signed)
Needs to be seen to get pain meds

## 2015-02-08 NOTE — Telephone Encounter (Signed)
Patient ntbs for follow up to get pain meds

## 2015-02-09 ENCOUNTER — Encounter: Payer: Self-pay | Admitting: Nurse Practitioner

## 2015-02-09 ENCOUNTER — Ambulatory Visit (INDEPENDENT_AMBULATORY_CARE_PROVIDER_SITE_OTHER): Payer: Medicare Other

## 2015-02-09 ENCOUNTER — Ambulatory Visit (INDEPENDENT_AMBULATORY_CARE_PROVIDER_SITE_OTHER): Payer: Medicare Other | Admitting: Nurse Practitioner

## 2015-02-09 ENCOUNTER — Encounter (INDEPENDENT_AMBULATORY_CARE_PROVIDER_SITE_OTHER): Payer: Self-pay

## 2015-02-09 VITALS — BP 118/69 | HR 77 | Temp 97.6°F | Ht 65.0 in | Wt 195.0 lb

## 2015-02-09 DIAGNOSIS — E785 Hyperlipidemia, unspecified: Secondary | ICD-10-CM

## 2015-02-09 DIAGNOSIS — E034 Atrophy of thyroid (acquired): Secondary | ICD-10-CM | POA: Diagnosis not present

## 2015-02-09 DIAGNOSIS — Z6832 Body mass index (BMI) 32.0-32.9, adult: Secondary | ICD-10-CM

## 2015-02-09 DIAGNOSIS — Z23 Encounter for immunization: Secondary | ICD-10-CM | POA: Diagnosis not present

## 2015-02-09 DIAGNOSIS — Z1212 Encounter for screening for malignant neoplasm of rectum: Secondary | ICD-10-CM | POA: Diagnosis not present

## 2015-02-09 DIAGNOSIS — G8929 Other chronic pain: Secondary | ICD-10-CM

## 2015-02-09 DIAGNOSIS — M549 Dorsalgia, unspecified: Secondary | ICD-10-CM

## 2015-02-09 DIAGNOSIS — Z1159 Encounter for screening for other viral diseases: Secondary | ICD-10-CM | POA: Diagnosis not present

## 2015-02-09 DIAGNOSIS — E038 Other specified hypothyroidism: Secondary | ICD-10-CM

## 2015-02-09 MED ORDER — ROSUVASTATIN CALCIUM 10 MG PO TABS: ORAL_TABLET | ORAL | Status: DC

## 2015-02-09 NOTE — Patient Instructions (Signed)

## 2015-02-09 NOTE — Progress Notes (Signed)
Subjective:    Patient ID: CEAIRRA MCCARVER, female    DOB: 12-14-1955, 59 y.o.   MRN: [id number]  Patient in today for foloow up of chronic medical problems. No complaints today.  Hyperlipidemia This is a chronic problem. Recent lipid tests were reviewed and are variable. Exacerbating diseases include obesity. She has no history of diabetes or hypothyroidism. Pertinent negatives include no myalgias. Current antihyperlipidemic treatment includes statins. The current treatment provides no improvement of lipids. Compliance problems include adherence to diet and adherence to exercise.   Thyroid Problem Patient reports no cold intolerance, constipation, diaphoresis, menstrual problem, palpitations, tremors or visual change. Her past medical history is significant for hyperlipidemia. There is no history of diabetes.  Chronic Low Back Pain/ spondylolisthesis Patient sees Dr. Louanne Skye & Dr. Carloyn Manner- Slipped vertebra- waiting for slippage to worsen before doing surgery. He does no do pain medication.  Review of Systems  Constitutional: Negative for diaphoresis.  Cardiovascular: Negative for palpitations.  Gastrointestinal: Negative for constipation.  Endocrine: Negative for cold intolerance.  Genitourinary: Negative for menstrual problem.  Musculoskeletal: Positive for back pain. Negative for myalgias.  Neurological: Negative for tremors.  All other systems reviewed and are negative.      Objective:   Physical Exam  Constitutional: She is oriented to person, place, and time. She appears well-developed and well-nourished.  HENT:  Nose: Nose normal.  Mouth/Throat: Oropharynx is clear and moist.  Eyes: EOM are normal.  Neck: Trachea normal, normal range of motion and full passive range of motion without pain. Neck supple. No JVD present. Carotid bruit is not present. No thyromegaly present.  Cardiovascular: Normal rate, regular rhythm, normal heart sounds and intact distal pulses.  Exam reveals no gallop  and no friction rub.   No murmur heard. Pulmonary/Chest: Effort normal and breath sounds normal.  Abdominal: Soft. Bowel sounds are normal. She exhibits no distension and no mass. There is no tenderness.  Musculoskeletal: Normal range of motion.  Slowly rises from laying position due to back pain.  Lymphadenopathy:    She has no cervical adenopathy.  Neurological: She is alert and oriented to person, place, and time. She has normal reflexes.  Skin: Skin is warm and dry.  Psychiatric: She has a normal mood and affect. Her behavior is normal. Judgment and thought content normal.    BP 118/69 mmHg  Pulse 77  Temp(Src) 97.6 F (36.4 C) (Oral)  Ht $R'5\' 5"'Ku$  (1.651 m)  Wt 195 lb (88.451 kg)  BMI 32.45 kg/m2  Chest x ray- no cardiopulmonary abnormalities-Preliminary reading by Ronnald Collum, FNP  [facility]  EKG- Kerry Hough, FNP       Assessment & Plan:  1. Hypothyroidism due to acquired atrophy of thyroid  2. Chronic back pain greater than 3 months duration Continue follow up with Dr. Carloyn Manner and [name]  3. Hyperlipidemia Low fat diet - DG Chest 2 View; Future - EKG 12-Lead - CMP14+EGFR - Lipid panel - rosuvastatin (CRESTOR) 10 MG tablet; TAKE ONE TABLET BY MOUTH ONE  TIME DAILY  Dispense: 90 tablet; Refill: 1  4. BMI 32.0-32.9,adult Discussed diet and exercise for person with BMI >25 Will recheck weight in 3-6 months   5. Need for hepatitis C screening test - Hepatitis C antibody  6. Screening for malignant neoplasm of the rectum - Fecal occult blood, imunochemical; Future   Counseling done on all immunizations given today- Flu Labs pending Health maintenance reviewed Diet and exercise encouraged Continue all meds Follow up  In 6 months  Julia Poole Done, FNP

## 2015-02-10 LAB — CMP14+EGFR
ALBUMIN: 4.3 g/dL (ref 3.6–4.8)
ALT: 55 IU/L — AB (ref 0–32)
AST: 56 IU/L — ABNORMAL HIGH (ref 0–40)
Albumin/Globulin Ratio: 2 (ref 1.1–2.5)
Alkaline Phosphatase: 80 IU/L (ref 39–117)
BUN / CREAT RATIO: 19 (ref 11–26)
BUN: 13 mg/dL (ref 8–27)
Bilirubin Total: 0.8 mg/dL (ref 0.0–1.2)
CHLORIDE: 102 mmol/L (ref 97–106)
CO2: 25 mmol/L (ref 18–29)
CREATININE: 0.69 mg/dL (ref 0.57–1.00)
Calcium: 9.4 mg/dL (ref 8.7–10.3)
GFR calc non Af Amer: 94 mL/min/{1.73_m2} (ref >59)
GFR, EST AFRICAN AMERICAN: 109 mL/min/{1.73_m2} (ref >59)
GLUCOSE: 97 mg/dL (ref 65–99)
Globulin, Total: 2.1 g/dL (ref 1.5–4.5)
Potassium: 4.7 mmol/L (ref 3.5–5.2)
Sodium: 140 mmol/L (ref 136–144)
TOTAL PROTEIN: 6.4 g/dL (ref 6.0–8.5)

## 2015-02-10 LAB — LIPID PANEL
CHOLESTEROL TOTAL: 141 mg/dL (ref 100–199)
Chol/HDL Ratio: 3.1 ratio units (ref 0.0–4.4)
HDL: 45 mg/dL (ref >39)
LDL CALC: 81 mg/dL (ref 0–99)
Triglycerides: 77 mg/dL (ref 0–149)
VLDL CHOLESTEROL CAL: 15 mg/dL (ref 5–40)

## 2015-02-10 LAB — HEPATITIS C ANTIBODY: Hep C Virus Ab: <0.1 s/co ratio (ref 0.0–0.9)

## 2015-02-13 NOTE — Progress Notes (Signed)
Patient informed. 

## 2015-03-23 ENCOUNTER — Other Ambulatory Visit: Payer: Self-pay | Admitting: Nurse Practitioner

## 2015-04-10 ENCOUNTER — Ambulatory Visit: Payer: Medicare Other | Admitting: Nurse Practitioner

## 2015-04-28 ENCOUNTER — Other Ambulatory Visit: Payer: Self-pay | Admitting: Nurse Practitioner

## 2015-05-01 NOTE — Telephone Encounter (Signed)
Last seen 02/09/15   MMM  If approved print

## 2015-05-02 NOTE — Telephone Encounter (Signed)
rx ready for pickup 

## 2015-05-19 ENCOUNTER — Ambulatory Visit (INDEPENDENT_AMBULATORY_CARE_PROVIDER_SITE_OTHER): Payer: Medicare Other | Admitting: Nurse Practitioner

## 2015-05-19 ENCOUNTER — Encounter: Payer: Self-pay | Admitting: Nurse Practitioner

## 2015-05-19 VITALS — BP 117/69 | HR 77 | Temp 97.4°F | Ht 65.0 in | Wt 195.0 lb

## 2015-05-19 DIAGNOSIS — E038 Other specified hypothyroidism: Secondary | ICD-10-CM | POA: Diagnosis not present

## 2015-05-19 DIAGNOSIS — E034 Atrophy of thyroid (acquired): Secondary | ICD-10-CM

## 2015-05-19 DIAGNOSIS — Z6832 Body mass index (BMI) 32.0-32.9, adult: Secondary | ICD-10-CM | POA: Diagnosis not present

## 2015-05-19 DIAGNOSIS — E785 Hyperlipidemia, unspecified: Secondary | ICD-10-CM

## 2015-05-19 DIAGNOSIS — Z23 Encounter for immunization: Secondary | ICD-10-CM

## 2015-05-19 DIAGNOSIS — M549 Dorsalgia, unspecified: Secondary | ICD-10-CM

## 2015-05-19 DIAGNOSIS — G8929 Other chronic pain: Secondary | ICD-10-CM | POA: Diagnosis not present

## 2015-05-19 MED ORDER — LEVOTHYROXINE SODIUM 50 MCG PO TABS: 50.0000 ug | ORAL_TABLET | Freq: Every day | ORAL | Status: DC

## 2015-05-19 MED ORDER — ROSUVASTATIN CALCIUM 10 MG PO TABS: ORAL_TABLET | ORAL | Status: DC

## 2015-05-19 NOTE — Addendum Note (Signed)
Addended by: Rolena Infante on: 05/19/2015 11:32 AM   Modules accepted: Orders

## 2015-05-19 NOTE — Progress Notes (Signed)
Subjective:    Patient ID: Julia Poole, female    DOB: 17-Sep-1955, 60 y.o.   MRN: [id number]  Patient here today for follow up of chronic medical problems.  Outpatient Encounter Prescriptions as of 05/19/2015  Medication Sig  . acetaminophen-codeine (TYLENOL #3) 300-30 MG tablet TAKE ONE TABLET BY MOUTH EVERY FOUR HOURS AS NEEDED FOR PAIN  . Ascorbic Acid (VITAMIN C) 500 MG tablet Take 500 mg by mouth daily.   Marland Kitchen aspirin 81 MG tablet Take 81 mg by mouth daily.    . Calcium-Vitamin D (CALTRATE 600 PLUS-VIT D PO) Take 600 mg by mouth 2 (two) times daily.  . Cholecalciferol (VITAMIN D) 2000 UNITS tablet Take 2,000 Units by mouth 2 (two) times daily.  . cyanocobalamin 100 MCG tablet Take 100 mcg by mouth daily.  . fish oil-omega-3 fatty acids 1000 MG capsule Take 1 g by mouth daily.  Marland Kitchen GARLIC PO Take 9,323 mg by mouth daily.  Marland Kitchen levothyroxine (SYNTHROID, LEVOTHROID) 50 MCG tablet Take 1 tablet (50 mcg total) by mouth daily.  . Multiple Vitamins-Minerals (CENTRUM SILVER PO) Take by mouth.    Marland Kitchen oxaprozin (DAYPRO) 600 MG tablet Take 300 mg by mouth daily.   . rosuvastatin (CRESTOR) 10 MG tablet TAKE ONE TABLET BY MOUTH ONE   TIME DAILY   No facility-administered encounter medications on file as of 05/19/2015.    Hyperlipidemia This is a chronic problem. Recent lipid tests were reviewed and are variable. Exacerbating diseases include obesity. She has no history of diabetes or hypothyroidism. Pertinent negatives include no myalgias. Current antihyperlipidemic treatment includes statins. The current treatment provides no improvement of lipids. Compliance problems include adherence to diet and adherence to exercise.   Thyroid Problem Patient reports no cold intolerance, constipation, diaphoresis, menstrual problem, palpitations, tremors or visual change. Her past medical history is significant for hyperlipidemia. There is no history of diabetes.  Chronic Low Back Pain/ spondylolisthesis Patient sees  Dr. Louanne Skye & Dr. Carloyn Manner- Slipped vertebra- waiting for slippage to worsen before doing surgery. He does no do pain medication.  Review of Systems  Constitutional: Negative for diaphoresis.  Cardiovascular: Negative for palpitations.  Gastrointestinal: Negative for constipation.  Endocrine: Negative for cold intolerance.  Genitourinary: Negative for menstrual problem.  Musculoskeletal: Positive for back pain. Negative for myalgias.  Neurological: Negative for tremors.  All other systems reviewed and are negative.      Objective:   Physical Exam  Constitutional: She is oriented to person, place, and time. She appears well-developed and well-nourished.  HENT:  Nose: Nose normal.  Mouth/Throat: Oropharynx is clear and moist.  Eyes: EOM are normal.  Neck: Trachea normal, normal range of motion and full passive range of motion without pain. Neck supple. No JVD present. Carotid bruit is not present. No thyromegaly present.  Cardiovascular: Normal rate, regular rhythm, normal heart sounds and intact distal pulses.  Exam reveals no gallop and no friction rub.   No murmur heard. Pulmonary/Chest: Effort normal and breath sounds normal.  Abdominal: Soft. Bowel sounds are normal. She exhibits no distension and no mass. There is no tenderness.  Musculoskeletal: Normal range of motion.  Slowly rises from laying position due to back pain.  Lymphadenopathy:    She has no cervical adenopathy.  Neurological: She is alert and oriented to person, place, and time. She has normal reflexes.  Skin: Skin is warm and dry.  Psychiatric: She has a normal mood and affect. Her behavior is normal. Judgment and thought content normal.  BP 117/69 mmHg  Pulse 77  Temp(Src) 97.4 F (36.3 C) (Oral)  Ht _0  (1.651 m)  Wt 195 lb (88.451 kg)  BMI 32.45 kg/m2       Assessment & Plan:  1. BMI 32.0-32.9,adult Discussed diet and exercise for person with BMI >25 Will recheck weight in 3-6 months  2.  Hyperlipidemia Low fat diet - CMP14+EGFR - Lipid panel - rosuvastatin (CRESTOR) 10 MG tablet; TAKE ONE TABLET BY MOUTH ONE   TIME DAILY  Dispense: 90 tablet; Refill: 1  3. Hypothyroidism due to acquired atrophy of thyroid - Thyroid Panel With TSH - levothyroxine (SYNTHROID, LEVOTHROID) 50 MCG tablet; Take 1 tablet (50 mcg total) by mouth daily.  Dispense: 90 tablet; Refill: 2  4. Chronic back pain greater than 3 months duration Keep follow up with DR. Carloyn Manner and August pending Health maintenance reviewed Diet and exercise encouraged Continue all meds Follow up  In 6 month   [place], [place]

## 2015-05-19 NOTE — Patient Instructions (Signed)
Fall Prevention in the Home  Falls can cause injuries and can affect people from all age groups. There are many simple things that you can do to make your home safe and to help prevent falls. WHAT CAN I DO ON THE OUTSIDE OF MY HOME?  Regularly repair the edges of walkways and driveways and fix any cracks.  Remove high doorway thresholds.  Trim any shrubbery on the main path into your home.  Use bright outdoor lighting.  Clear walkways of debris and clutter, including tools and rocks.  Regularly check that handrails are securely fastened and in good repair. Both sides of any steps should have handrails.  Install guardrails along the edges of any raised decks or porches.  Have leaves, snow, and ice cleared regularly.  Use sand or salt on walkways during winter months.  In the garage, clean up any spills right away, including grease or oil spills. WHAT CAN I DO IN THE BATHROOM?  Use night lights.  Install grab bars by the toilet and in the tub and shower. Do not use towel bars as grab bars.  Use non-skid mats or decals on the floor of the tub or shower.  If you need to sit down while you are in the shower, use a plastic, non-slip stool..  Keep the floor dry. Immediately clean up any water that spills on the floor.  Remove soap buildup in the tub or shower on a regular basis.  Attach bath mats securely with double-sided non-slip rug tape.  Remove throw rugs and other tripping hazards from the floor. WHAT CAN I DO IN THE BEDROOM?  Use night lights.  Make sure that a bedside light is easy to reach.  Do not use oversized bedding that drapes onto the floor.  Have a firm chair that has side arms to use for getting dressed.  Remove throw rugs and other tripping hazards from the floor. WHAT CAN I DO IN THE KITCHEN?   Clean up any spills right away.  Avoid walking on wet floors.  Place frequently used items in easy-to-reach places.  If you need to reach for something  above you, use a sturdy step stool that has a grab bar.  Keep electrical cables out of the way.  Do not use floor polish or wax that makes floors slippery. If you have to use wax, make sure that it is non-skid floor wax.  Remove throw rugs and other tripping hazards from the floor. WHAT CAN I DO IN THE STAIRWAYS?  Do not leave any items on the stairs.  Make sure that there are handrails on both sides of the stairs. Fix handrails that are broken or loose. Make sure that handrails are as long as the stairways.  Check any carpeting to make sure that it is firmly attached to the stairs. Fix any carpet that is loose or worn.  Avoid having throw rugs at the top or bottom of stairways, or secure the rugs with carpet tape to prevent them from moving.  Make sure that you have a light switch at the top of the stairs and the bottom of the stairs. If you do not have them, have them installed. WHAT ARE SOME OTHER FALL PREVENTION TIPS?  Wear closed-toe shoes that fit well and support your feet. Wear shoes that have rubber soles or low heels.  When you use a stepladder, make sure that it is completely opened and that the sides are firmly locked. Have someone hold the ladder while you   are using it. Do not climb a closed stepladder.  Add color or contrast paint or tape to grab bars and handrails in your home. Place contrasting color strips on the first and last steps.  Use mobility aids as needed, such as canes, walkers, scooters, and crutches.  Turn on lights if it is dark. Replace any light bulbs that burn out.  Set up furniture so that there are clear paths. Keep the furniture in the same spot.  Fix any uneven floor surfaces.  Choose a carpet design that does not hide the edge of steps of a stairway.  Be aware of any and all pets.  Review your medicines with your healthcare provider. Some medicines can cause dizziness or changes in blood pressure, which increase your risk of falling. Talk  with your health care provider about other ways that you can decrease your risk of falls. This may include working with a physical therapist or trainer to improve your strength, balance, and endurance.   This information is not intended to replace advice given to you by your health care provider. Make sure you discuss any questions you have with your health care provider.   Document Released: 03/08/2002 Document Revised: 08/02/2014 Document Reviewed: 04/22/2014 Elsevier Interactive Patient Education 2016 [employer]  

## 2015-05-20 LAB — LIPID PANEL
CHOLESTEROL TOTAL: 139 mg/dL (ref 100–199)
Chol/HDL Ratio: 3 ratio units (ref 0.0–4.4)
HDL: 46 mg/dL (ref >39)
LDL Calculated: 73 mg/dL (ref 0–99)
TRIGLYCERIDES: 102 mg/dL (ref 0–149)
VLDL CHOLESTEROL CAL: 20 mg/dL (ref 5–40)

## 2015-05-20 LAB — THYROID PANEL WITH TSH
Free Thyroxine Index: 2.3 (ref 1.2–4.9)
T3 Uptake Ratio: 31 % (ref 24–39)
T4, Total: 7.4 ug/dL (ref 4.5–12.0)
TSH: 2.71 u[IU]/mL (ref 0.450–4.500)

## 2015-05-20 LAB — CMP14+EGFR
ALT: 34 IU/L — AB (ref 0–32)
AST: 28 IU/L (ref 0–40)
Albumin/Globulin Ratio: 1.8 (ref 1.1–2.5)
Albumin: 4.3 g/dL (ref 3.6–4.8)
Alkaline Phosphatase: 78 IU/L (ref 39–117)
BUN/Creatinine Ratio: 22 (ref 11–26)
BUN: 15 mg/dL (ref 8–27)
Bilirubin Total: 0.6 mg/dL (ref 0.0–1.2)
CALCIUM: 9.6 mg/dL (ref 8.7–10.3)
CHLORIDE: 103 mmol/L (ref 96–106)
CO2: 26 mmol/L (ref 18–29)
CREATININE: 0.67 mg/dL (ref 0.57–1.00)
GFR calc Af Amer: 110 mL/min/{1.73_m2} (ref >59)
GFR calc non Af Amer: 95 mL/min/{1.73_m2} (ref >59)
GLUCOSE: 109 mg/dL — AB (ref 65–99)
Globulin, Total: 2.4 g/dL (ref 1.5–4.5)
Potassium: 4.7 mmol/L (ref 3.5–5.2)
Sodium: 142 mmol/L (ref 134–144)
TOTAL PROTEIN: 6.7 g/dL (ref 6.0–8.5)

## 2015-06-26 DIAGNOSIS — M545 Low back pain: Secondary | ICD-10-CM | POA: Diagnosis not present

## 2015-06-26 DIAGNOSIS — M4310 Spondylolisthesis, site unspecified: Secondary | ICD-10-CM | POA: Diagnosis not present

## 2015-07-26 DIAGNOSIS — E669 Obesity, unspecified: Secondary | ICD-10-CM | POA: Diagnosis not present

## 2015-08-04 ENCOUNTER — Other Ambulatory Visit: Payer: Self-pay | Admitting: Nurse Practitioner

## 2015-08-07 NOTE — Telephone Encounter (Signed)
Patient of MMM. Last seen in office on 05-19-15. Rx last filled on 05/08/15 for #120. Please advise. If approved please route to pool A so nurse can call patient to pick up. Rx will print

## 2015-08-14 ENCOUNTER — Ambulatory Visit: Payer: Medicare Other | Admitting: Nurse Practitioner

## 2015-08-15 ENCOUNTER — Encounter: Payer: Self-pay | Admitting: Gastroenterology

## 2015-08-22 DIAGNOSIS — M47816 Spondylosis without myelopathy or radiculopathy, lumbar region: Secondary | ICD-10-CM | POA: Diagnosis not present

## 2015-08-22 DIAGNOSIS — M4316 Spondylolisthesis, lumbar region: Secondary | ICD-10-CM | POA: Diagnosis not present

## 2015-09-05 ENCOUNTER — Encounter: Payer: Self-pay | Admitting: Gastroenterology

## 2015-09-12 ENCOUNTER — Other Ambulatory Visit: Payer: Self-pay | Admitting: Nurse Practitioner

## 2015-09-12 NOTE — Telephone Encounter (Signed)
Last seen 05/19/15 MMM Last lipid 02/09/15  Requesting 90 day supply

## 2015-10-06 ENCOUNTER — Ambulatory Visit (INDEPENDENT_AMBULATORY_CARE_PROVIDER_SITE_OTHER): Payer: Medicare Other | Admitting: Nurse Practitioner

## 2015-10-06 ENCOUNTER — Encounter: Payer: Self-pay | Admitting: Nurse Practitioner

## 2015-10-06 VITALS — BP 114/71 | HR 103 | Temp 99.1°F | Ht 65.0 in | Wt 190.0 lb

## 2015-10-06 DIAGNOSIS — R52 Pain, unspecified: Secondary | ICD-10-CM

## 2015-10-06 DIAGNOSIS — B349 Viral infection, unspecified: Secondary | ICD-10-CM

## 2015-10-06 MED ORDER — KETOROLAC TROMETHAMINE 60 MG/2ML IM SOLN
60.0000 mg | Freq: Once | INTRAMUSCULAR | Status: AC
  Administered 2015-10-06: 60 mg via INTRAMUSCULAR

## 2015-10-06 NOTE — Progress Notes (Signed)
   Subjective:    Patient ID: Julia Poole, female    DOB: [birth year], 60 y.o.   MRN: [id number]  HPI Patient in c/o body aches that started on Tuesday. SLight headache- fever but has not checked. Denies any nausea or vomiting but does have decrease appetite. Has not had tick bite that she is aware of. Has taken tylnol which helps.    Review of Systems  Constitutional: Positive for fever, chills and appetite change (decreased).  HENT: Negative.   Respiratory: Negative.   Cardiovascular: Negative.   Gastrointestinal: Negative.   Genitourinary: Negative.   Neurological: Negative.   Psychiatric/Behavioral: Negative.   All other systems reviewed and are negative.      Objective:   Physical Exam  Constitutional: She is oriented to person, place, and time. She appears well-developed and well-nourished. No distress.  Cardiovascular: Normal rate, regular rhythm and normal heart sounds.   Pulmonary/Chest: Effort normal and breath sounds normal.  Neurological: She is alert and oriented to person, place, and time.  Skin: Skin is warm.  Psychiatric: She has a normal mood and affect. Her behavior is normal. Judgment and thought content normal.   BP 114/71 mmHg  Pulse 103  Temp(Src) 99.1 F (37.3 C) (Oral)  Ht 5\' 5"  (1.651 m)  Wt 190 lb (86.183 kg)  BMI 31.62 kg/m2      Assessment & Plan:   1. Body aches   2. Acute viral syndrome    Force fluids rest RTO if not improving or new symptoms develop  Meds ordered this encounter  Medications  . ketorolac (TORADOL) injection 60 mg    Sig:    [name] Done, FNP

## 2015-10-10 ENCOUNTER — Encounter: Payer: Self-pay | Admitting: Nurse Practitioner

## 2015-10-10 ENCOUNTER — Ambulatory Visit (INDEPENDENT_AMBULATORY_CARE_PROVIDER_SITE_OTHER): Payer: Medicare Other | Admitting: Nurse Practitioner

## 2015-10-10 VITALS — BP 117/70 | HR 90 | Temp 97.8°F | Ht 65.0 in | Wt 194.0 lb

## 2015-10-10 DIAGNOSIS — R410 Disorientation, unspecified: Secondary | ICD-10-CM | POA: Diagnosis not present

## 2015-10-10 NOTE — Progress Notes (Signed)
   Subjective:    Patient ID: Julia Poole, female    DOB: [birth year], 60 y.o.   MRN: [id number]  HPI  Pateint was seen last week with body aches and low grade fever- was diagnosed with viral syndrome. She says she started feeloing better over the weekend- but since then she says that she thinks she is having memory loss. Says that she is not completely herself. Was having trouble getting out of car last night- like she did not know how to open the door. "forgetting things that she does not need  To forget"!   Review of Systems  Constitutional: Negative.   HENT: Negative.   Respiratory: Negative.   Cardiovascular: Negative.   Genitourinary: Negative.   Neurological: Negative for dizziness, tremors, syncope, weakness and headaches.  Psychiatric/Behavioral: Positive for confusion (random).  All other systems reviewed and are negative.      Objective:   Physical Exam  Constitutional: She is oriented to person, place, and time. She appears well-developed and well-nourished.  Cardiovascular: Normal rate, regular rhythm and normal heart sounds.   Pulmonary/Chest: Effort normal and breath sounds normal.  Neurological: She is alert and oriented to person, place, and time. She has normal reflexes. No cranial nerve deficit.  Skin: Skin is warm.  Psychiatric: She has a normal mood and affect. Her behavior is normal. Judgment and thought content normal.  Patient was able to competely events in detail from the last 6 days.   BP 117/70 mmHg  Pulse 90  Temp(Src) 97.8 F (36.6 C) (Oral)  Ht 5\' 5"  (1.651 m)  Wt 194 lb (87.998 kg)  BMI 32.28 kg/m2       Assessment & Plan:   1. Confusion    Rest Force fluids RTO if not improving  [name] Done, FNP

## 2015-10-13 ENCOUNTER — Encounter (HOSPITAL_COMMUNITY): Payer: Self-pay | Admitting: *Deleted

## 2015-10-13 ENCOUNTER — Inpatient Hospital Stay (HOSPITAL_COMMUNITY)
Admission: EM | Admit: 2015-10-13 | Discharge: 2015-10-18 | DRG: 025 | Disposition: A | Discharge status: Home Health | Payer: Medicare Other | Attending: Neurological Surgery | Admitting: Neurological Surgery

## 2015-10-13 ENCOUNTER — Emergency Department (HOSPITAL_COMMUNITY): Payer: Medicare Other

## 2015-10-13 DIAGNOSIS — D329 Benign neoplasm of meninges, unspecified: Secondary | ICD-10-CM | POA: Diagnosis not present

## 2015-10-13 DIAGNOSIS — D429 Neoplasm of uncertain behavior of meninges, unspecified: Secondary | ICD-10-CM | POA: Diagnosis not present

## 2015-10-13 DIAGNOSIS — R410 Disorientation, unspecified: Secondary | ICD-10-CM | POA: Diagnosis not present

## 2015-10-13 DIAGNOSIS — D320 Benign neoplasm of cerebral meninges: Principal | ICD-10-CM | POA: Diagnosis present

## 2015-10-13 DIAGNOSIS — M1990 Unspecified osteoarthritis, unspecified site: Secondary | ICD-10-CM | POA: Diagnosis not present

## 2015-10-13 DIAGNOSIS — Z7982 Long term (current) use of aspirin: Secondary | ICD-10-CM

## 2015-10-13 DIAGNOSIS — D496 Neoplasm of unspecified behavior of brain: Secondary | ICD-10-CM | POA: Diagnosis not present

## 2015-10-13 DIAGNOSIS — T380X5A Adverse effect of glucocorticoids and synthetic analogues, initial encounter: Secondary | ICD-10-CM | POA: Diagnosis not present

## 2015-10-13 DIAGNOSIS — D332 Benign neoplasm of brain, unspecified: Secondary | ICD-10-CM

## 2015-10-13 DIAGNOSIS — Z79899 Other long term (current) drug therapy: Secondary | ICD-10-CM | POA: Diagnosis not present

## 2015-10-13 DIAGNOSIS — G9389 Other specified disorders of brain: Secondary | ICD-10-CM | POA: Diagnosis not present

## 2015-10-13 DIAGNOSIS — D62 Acute posthemorrhagic anemia: Secondary | ICD-10-CM | POA: Diagnosis not present

## 2015-10-13 DIAGNOSIS — Z800 Family history of malignant neoplasm of digestive organs: Secondary | ICD-10-CM

## 2015-10-13 DIAGNOSIS — C719 Malignant neoplasm of brain, unspecified: Secondary | ICD-10-CM | POA: Diagnosis not present

## 2015-10-13 DIAGNOSIS — G936 Cerebral edema: Secondary | ICD-10-CM | POA: Diagnosis present

## 2015-10-13 DIAGNOSIS — Z853 Personal history of malignant neoplasm of breast: Secondary | ICD-10-CM | POA: Diagnosis not present

## 2015-10-13 DIAGNOSIS — G939 Disorder of brain, unspecified: Secondary | ICD-10-CM | POA: Diagnosis not present

## 2015-10-13 DIAGNOSIS — E785 Hyperlipidemia, unspecified: Secondary | ICD-10-CM | POA: Diagnosis present

## 2015-10-13 DIAGNOSIS — R001 Bradycardia, unspecified: Secondary | ICD-10-CM | POA: Diagnosis not present

## 2015-10-13 DIAGNOSIS — E039 Hypothyroidism, unspecified: Secondary | ICD-10-CM | POA: Diagnosis present

## 2015-10-13 DIAGNOSIS — Z8249 Family history of ischemic heart disease and other diseases of the circulatory system: Secondary | ICD-10-CM | POA: Diagnosis not present

## 2015-10-13 DIAGNOSIS — D420 Neoplasm of uncertain behavior of cerebral meninges: Secondary | ICD-10-CM | POA: Diagnosis not present

## 2015-10-13 DIAGNOSIS — D72829 Elevated white blood cell count, unspecified: Secondary | ICD-10-CM | POA: Diagnosis not present

## 2015-10-13 DIAGNOSIS — Z9889 Other specified postprocedural states: Secondary | ICD-10-CM | POA: Diagnosis not present

## 2015-10-13 DIAGNOSIS — Z806 Family history of leukemia: Secondary | ICD-10-CM

## 2015-10-13 LAB — DIFFERENTIAL
BASOS ABS: 0 10*3/uL (ref 0.0–0.1)
Basophils Relative: 0 %
EOS ABS: 0.2 10*3/uL (ref 0.0–0.7)
Eosinophils Relative: 3 %
LYMPHS ABS: 2.3 10*3/uL (ref 0.7–4.0)
LYMPHS PCT: 28 %
Monocytes Absolute: 0.6 10*3/uL (ref 0.1–1.0)
Monocytes Relative: 7 %
NEUTROS PCT: 62 %
Neutro Abs: 4.9 10*3/uL (ref 1.7–7.7)

## 2015-10-13 LAB — CBC
HCT: 40.7 % (ref 36.0–46.0)
HCT: 40.6 % (ref 36.0–46.0)
HEMOGLOBIN: 13.6 g/dL (ref 12.0–15.0)
Hemoglobin: 13.6 g/dL (ref 12.0–15.0)
MCH: 30.7 pg (ref 26.0–34.0)
MCH: 30.9 pg (ref 26.0–34.0)
MCHC: 33.4 g/dL (ref 30.0–36.0)
MCHC: 33.5 g/dL (ref 30.0–36.0)
MCV: 91.9 fL (ref 78.0–100.0)
MCV: 92.3 fL (ref 78.0–100.0)
Platelets: 351 10*3/uL (ref 150–400)
Platelets: 349 10*3/uL (ref 150–400)
RBC: 4.43 MIL/uL (ref 3.87–5.11)
RBC: 4.4 MIL/uL (ref 3.87–5.11)
RDW: 12.4 % (ref 11.5–15.5)
RDW: 12.2 % (ref 11.5–15.5)
WBC: 8 10*3/uL (ref 4.0–10.5)
WBC: 7.6 10*3/uL (ref 4.0–10.5)

## 2015-10-13 LAB — I-STAT CHEM 8, ED
BUN: 15 mg/dL (ref 6–20)
CHLORIDE: 103 mmol/L (ref 101–111)
Calcium, Ion: 1.21 mmol/L (ref 1.12–1.23)
Creatinine, Ser: 0.6 mg/dL (ref 0.44–1.00)
Glucose, Bld: 134 mg/dL — ABNORMAL HIGH (ref 65–99)
HCT: 40 % (ref 36.0–46.0)
Hemoglobin: 13.6 g/dL (ref 12.0–15.0)
POTASSIUM: 4 mmol/L (ref 3.5–5.1)
SODIUM: 141 mmol/L (ref 135–145)
TCO2: 28 mmol/L (ref 0–100)

## 2015-10-13 LAB — APTT: APTT: 27 s (ref 24–37)

## 2015-10-13 LAB — COMPREHENSIVE METABOLIC PANEL
ALBUMIN: 3.8 g/dL (ref 3.5–5.0)
ALK PHOS: 107 U/L (ref 38–126)
ALT: 30 U/L (ref 14–54)
AST: 32 U/L (ref 15–41)
Anion gap: 7 (ref 5–15)
BILIRUBIN TOTAL: 0.8 mg/dL (ref 0.3–1.2)
BUN: 14 mg/dL (ref 6–20)
CO2: 27 mmol/L (ref 22–32)
Calcium: 9.8 mg/dL (ref 8.9–10.3)
Chloride: 105 mmol/L (ref 101–111)
Creatinine, Ser: 0.6 mg/dL (ref 0.44–1.00)
GFR calc Af Amer: >60 mL/min (ref >60)
GFR calc non Af Amer: >60 mL/min (ref >60)
GLUCOSE: 136 mg/dL — AB (ref 65–99)
POTASSIUM: 3.9 mmol/L (ref 3.5–5.1)
SODIUM: 139 mmol/L (ref 135–145)
TOTAL PROTEIN: 7.1 g/dL (ref 6.5–8.1)

## 2015-10-13 LAB — CREATININE, SERUM
Creatinine, Ser: 0.63 mg/dL (ref 0.44–1.00)
GFR calc Af Amer: >60 mL/min (ref >60)
GFR calc non Af Amer: >60 mL/min (ref >60)

## 2015-10-13 LAB — PHOSPHORUS: Phosphorus: 3.4 mg/dL (ref 2.5–4.6)

## 2015-10-13 LAB — URINALYSIS, ROUTINE W REFLEX MICROSCOPIC
Bilirubin Urine: NEGATIVE
Glucose, UA: NEGATIVE mg/dL
Hgb urine dipstick: NEGATIVE
Ketones, ur: NEGATIVE mg/dL
Nitrite: NEGATIVE
Protein, ur: NEGATIVE mg/dL
Specific Gravity, Urine: 1.014 (ref 1.005–1.030)
pH: 8 (ref 5.0–8.0)

## 2015-10-13 LAB — URINE MICROSCOPIC-ADD ON: RBC / HPF: NONE SEEN RBC/hpf (ref 0–5)

## 2015-10-13 LAB — I-STAT TROPONIN, ED: Troponin i, poc: 0 ng/mL (ref 0.00–0.08)

## 2015-10-13 LAB — CBG MONITORING, ED: Glucose-Capillary: 100 mg/dL — ABNORMAL HIGH (ref 65–99)

## 2015-10-13 LAB — TSH: TSH: 2.055 u[IU]/mL (ref 0.350–4.500)

## 2015-10-13 LAB — PROTIME-INR
INR: 1.03 (ref 0.00–1.49)
Prothrombin Time: 13.7 seconds (ref 11.6–15.2)

## 2015-10-13 LAB — MAGNESIUM: Magnesium: 2.2 mg/dL (ref 1.7–2.4)

## 2015-10-13 MED ORDER — SODIUM CHLORIDE 0.9 % IV SOLN
1000.0000 mg | Freq: Once | INTRAVENOUS | Status: AC
  Administered 2015-10-13: 1000 mg via INTRAVENOUS
  Filled 2015-10-13: qty 10

## 2015-10-13 MED ORDER — OMEGA-3-ACID ETHYL ESTERS 1 G PO CAPS
1.0000 g | ORAL_CAPSULE | Freq: Every day | ORAL | Status: DC
  Administered 2015-10-13 – 2015-10-18 (×5): 1 g via ORAL
  Filled 2015-10-13 (×5): qty 1

## 2015-10-13 MED ORDER — DEXAMETHASONE SODIUM PHOSPHATE 10 MG/ML IJ SOLN
10.0000 mg | Freq: Once | INTRAMUSCULAR | Status: AC
Start: 2015-10-13 — End: 2015-10-13
  Administered 2015-10-13: 10 mg via INTRAVENOUS
  Filled 2015-10-13: qty 1

## 2015-10-13 MED ORDER — VITAMIN B-12 100 MCG PO TABS: 100.0000 ug | ORAL_TABLET | Freq: Every day | ORAL | Status: DC

## 2015-10-13 MED ORDER — OMEGA-3 FATTY ACIDS 1000 MG PO CAPS: 1.0000 g | ORAL_CAPSULE | Freq: Every day | ORAL | Status: DC

## 2015-10-13 MED ORDER — DEXAMETHASONE SODIUM PHOSPHATE 4 MG/ML IJ SOLN
4.0000 mg | Freq: Four times a day (QID) | INTRAMUSCULAR | Status: DC
  Administered 2015-10-13 – 2015-10-17 (×15): 4 mg via INTRAVENOUS
  Filled 2015-10-13 (×15): qty 1

## 2015-10-13 MED ORDER — LEVOTHYROXINE SODIUM 50 MCG PO TABS
50.0000 ug | ORAL_TABLET | Freq: Every day | ORAL | Status: DC
  Administered 2015-10-13 – 2015-10-18 (×4): 50 ug via ORAL
  Filled 2015-10-13 (×5): qty 1

## 2015-10-13 MED ORDER — VITAMIN B-12 100 MCG PO TABS
100.0000 ug | ORAL_TABLET | Freq: Every day | ORAL | Status: DC
  Administered 2015-10-13 – 2015-10-18 (×5): 100 ug via ORAL
  Filled 2015-10-13 (×5): qty 1

## 2015-10-13 MED ORDER — SODIUM CHLORIDE 0.9 % IV SOLN
1000.0000 mg | Freq: Two times a day (BID) | INTRAVENOUS | Status: DC
  Administered 2015-10-14 – 2015-10-15 (×3): 1000 mg via INTRAVENOUS
  Filled 2015-10-13 (×5): qty 10

## 2015-10-13 MED ORDER — ROSUVASTATIN CALCIUM 5 MG PO TABS
10.0000 mg | ORAL_TABLET | Freq: Every day | ORAL | Status: DC
  Administered 2015-10-13 – 2015-10-18 (×5): 10 mg via ORAL
  Filled 2015-10-13: qty 1
  Filled 2015-10-13 (×4): qty 2

## 2015-10-13 MED ORDER — ENOXAPARIN SODIUM 40 MG/0.4ML ~~LOC~~ SOLN
40.0000 mg | SUBCUTANEOUS | Status: AC
  Administered 2015-10-13 – 2015-10-14 (×2): 40 mg via SUBCUTANEOUS
  Filled 2015-10-13 (×2): qty 0.4

## 2015-10-13 NOTE — ED Notes (Signed)
Pt reports 3/10 Chest Tightness. Pt reports, I think it is just nerves. MD Laneta Simmers made aware and second EKG completed. Troponin negative and two EKGs normal

## 2015-10-13 NOTE — ED Provider Notes (Signed)
CSN: [id number]     Arrival date & time 10/13/15  1453 History   First MD Initiated Contact with Patient 10/13/15 1547     Chief Complaint  Patient presents with  . Altered Mental Status    (Consider location/radiation/quality/duration/timing/severity/associated sxs/prior Treatment) Patient is a 60 y.o. female presenting with altered mental status. The history is provided by the patient.  Altered Mental Status Presenting symptoms: confusion   Severity:  Moderate Most recent episode:  More than 2 days ago Episode history:  Continuous Timing:  Constant Progression:  Worsening Chronicity:  New Context: not alcohol use   Associated symptoms: no abdominal pain, no fever, no light-headedness, no nausea, no palpitations, no rash and no vomiting     Past Medical History  Diagnosis Date  . Hemorrhoids   . Bleeding nose   . Sinus problem   . Arthritis   . Allergy   . Cancer [facility])     right breast  . Thyroid disease   . Hyperlipidemia   . Degenerative disk disease 2012   Past Surgical History  Procedure Laterality Date  . Ganglion cyst excision  SEP. 1997    RIGHT WRIST  . Colonoscopy    . Breast surgery  AUG. 2010    right breast lumpectomy  . Breast surgery      Cancer   Family History  Problem Relation Age of Onset  . Cancer Mother     colon  . Colon cancer Mother   . Diabetes Father   . Heart disease Father   . Cancer Father     skin  . Cancer Brother     breast  . Cancer Sister     leukemia  . Cancer Brother     skin  . Cancer Maternal Aunt     ovarian   Social History  Substance Use Topics  . Smoking status: Never Smoker   . Smokeless tobacco: Never Used  . Alcohol Use: No   OB History    No data available     Review of Systems  Constitutional: Negative for fever and chills.  HENT: Negative for congestion and sore throat.   Eyes: Negative for pain.  Respiratory: Negative for cough and shortness of breath.   Cardiovascular: Negative for chest  pain and palpitations.  Gastrointestinal: Negative for nausea, vomiting, abdominal pain and diarrhea.  Genitourinary: Negative for dysuria and flank pain.  Musculoskeletal: Negative for back pain and neck pain.  Skin: Negative for rash.  Allergic/Immunologic: Negative.   Neurological: Negative for dizziness and light-headedness.  Psychiatric/Behavioral: Positive for confusion.    Allergies  Review of patient's allergies indicates no known allergies.  Home Medications   Prior to Admission medications   Medication Sig Start Date End Date Taking? Authorizing Provider  acetaminophen-codeine (TYLENOL #3) 300-30 MG tablet TAKE ONE TABLET BY MOUTH EVERY FOUR HOURS AS NEEDED FOR PAIN 08/07/15  Yes Sharion Balloon, FNP  Ascorbic Acid (VITAMIN C) 500 MG tablet Take 500 mg by mouth daily.    Yes Historical Provider, MD  aspirin 81 MG tablet Take 81 mg by mouth daily.     Yes Historical Provider, MD  Calcium-Vitamin D (CALTRATE 600 PLUS-VIT D PO) Take 600 mg by mouth 2 (two) times daily.   Yes Historical Provider, MD  Cholecalciferol (VITAMIN D) 2000 UNITS tablet Take 2,000 Units by mouth 2 (two) times daily.   Yes Historical Provider, MD  cyanocobalamin 100 MCG tablet Take 100 mcg by mouth daily.   Yes  Historical Provider, MD  fish oil-omega-3 fatty acids 1000 MG capsule Take 1 g by mouth daily.   Yes Historical Provider, MD  GARLIC PO Take [id number] mg by mouth daily.   Yes Historical Provider, MD  levothyroxine (SYNTHROID, LEVOTHROID) 50 MCG tablet Take 1 tablet (50 mcg total) by mouth daily. 05/19/15  Yes [name] Done, FNP  Multiple Vitamins-Minerals (CENTRUM SILVER PO) Take by mouth.     Yes Historical Provider, MD  oxaprozin (DAYPRO) 600 MG tablet Take 300 mg by mouth daily.  06/29/12  Yes Historical Provider, MD  rosuvastatin (CRESTOR) 10 MG tablet TAKE ONE TABLET BY MOUTH ONE TIME DAILY 09/12/15  Yes Christy A Hawks, FNP   BP 126/63 mmHg  Pulse 86  Temp(Src) 98.1 F (36.7 C) (Oral)  Resp  18  Ht 5\' 5"  (1.651 m)  Wt 85.4 kg  BMI 31.33 kg/m2  SpO2 96% Physical Exam  Constitutional: She is oriented to person, place, and time. She appears well-developed and well-nourished. No distress.  HENT:  Head: Normocephalic and atraumatic.  Eyes: Conjunctivae and EOM are normal. Pupils are equal, round, and reactive to light.  Neck: Normal range of motion. Neck supple.  Cardiovascular: Normal rate, regular rhythm and normal heart sounds.   Pulmonary/Chest: Effort normal and breath sounds normal. No respiratory distress.  Abdominal: Soft. Bowel sounds are normal. There is no tenderness.  Musculoskeletal: Normal range of motion.  Neurological: She is alert and oriented to person, place, and time. She has normal strength and normal reflexes. No cranial nerve deficit or sensory deficit. She displays a negative Romberg sign. GCS eye subscore is 4. GCS verbal subscore is 5. GCS motor subscore is 6.  Normal finger to nose bilaterally.   No pronator drift bilaterally.    Skin: Skin is warm and dry. She is not diaphoretic.  Psychiatric: She has a normal mood and affect.    ED Course  Procedures (including critical care time) Labs Review Labs Reviewed  COMPREHENSIVE METABOLIC PANEL - Abnormal; Notable for the following:    Glucose, Bld 136 (*)    All other components within normal limits  URINALYSIS, ROUTINE W REFLEX MICROSCOPIC (NOT AT [facility]) - Abnormal; Notable for the following:    APPearance CLOUDY (*)    Leukocytes, UA TRACE (*)    All other components within normal limits  URINE MICROSCOPIC-ADD ON - Abnormal; Notable for the following:    Squamous Epithelial / LPF 0-5 (*)    Bacteria, UA RARE (*)    All other components within normal limits  CBG MONITORING, ED - Abnormal; Notable for the following:    Glucose-Capillary 100 (*)    All other components within normal limits  I-STAT CHEM 8, ED - Abnormal; Notable for the following:    Glucose, Bld 134 (*)    All other components  within normal limits  PROTIME-INR  APTT  CBC  DIFFERENTIAL  CBC  CREATININE, SERUM  MAGNESIUM  PHOSPHORUS  TSH  COMPREHENSIVE METABOLIC PANEL  CBC  I-STAT TROPOININ, ED    Imaging Review Ct Head Wo Contrast  10/13/2015  CLINICAL DATA:  60 year old female with acute confusion and dysarthria. EXAM: CT HEAD WITHOUT CONTRAST TECHNIQUE: Contiguous axial images were obtained from the base of the skull through the vertex without intravenous contrast. COMPARISON:  None. FINDINGS: A 3.2 x 3.5 x 4.4 cm partially calcified mass in the medial left parietal region is likely extra-axial and likely a meningioma. A large amount of vasogenic edema in the adjacent brain is  noted. A 1.1 x 1.3 x 1 cm right parafalcine meningioma (image 22) and a 6 mm right frontal meningioma (image 12) are noted without adjacent parenchymal edema. There is no evidence of hemorrhage, hydrocephalus or cortical infarct. No bony abnormalities are identified. IMPRESSION: 3.2 x 3.5 x 4.4 cm medial left parietal region mass with large amount of vasogenic edema in the adjacent brain. This mass is likely a meningioma. Small meningiomas in the right parafalcine and right frontal regions. No evidence of adjacent edema or mass effect. No evidence of hemorrhage or acute cortical infarction. These results were discussed with Dr. Kathrynn Humble on 10/13/2015 at 3:54 p.m. Electronically Signed   By: Margarette Canada M.D.   On: 10/13/2015 15:56   I have personally reviewed and evaluated these images and lab results as part of my medical decision-making.   EKG Interpretation   Date/Time:  Friday October 13 2015 18:53:57 EDT Ventricular Rate:  74 PR Interval:  168 QRS Duration: 95 QT Interval:  379 QTC Calculation: 421 R Axis:   87 Text Interpretation:  Sinus rhythm Borderline right axis deviation Low  voltage, precordial leads unchanged Confirmed by KNOTT MD, Quillian Quince [id number])  on 10/13/2015 6:56:40 PM      MDM   Final diagnoses:  Brain mass     The patient is a 59 year old female with a history of previous right-sided breast cancer and hypothyroidism presenting today for altered mental status. Reports just mild confusion and clubbing clumsiness over the last several weeks.  On evaluation the patient is hemodynamically stable and in no acute distress. Neurologic exam with no acute abnormalities. CT head performed showing multiple areas concerning for possible masses with no bleeds. Mild vasogenic edema surrounding these areas as well.  Discussed with NSU and recommend decadron and keppra which were given in the ED.  Discussed with hospitalist who agrees to admission with NSU following.   Labs and images were viewed by myself and incorporated into medical decision making.  Discussed pertinent finding with patient or caregiver prior to admission with no further questions.  Pt care supervised by my attending Dr. Laneta Simmers.   Geronimo Boot, MD PGY-3 Emergency Medicine     Geronimo Boot, MD 10/14/15 [id number]  Leo Grosser, MD 10/14/15 [phone]

## 2015-10-13 NOTE — ED Notes (Signed)
Attempted Report x1. Stated that Agricultural consultant was trying to speak with bed control about bed placement.

## 2015-10-13 NOTE — ED Notes (Signed)
Charge RN aware of pts CT results, pt to be transported to Trauma B from CT, Laneta Simmers, MD notified Re: CT results

## 2015-10-13 NOTE — H&P (Signed)
History and Physical  ABIOLA KUEHNLE [id number] DOB: 22-May-1955 DOA: 10/13/2015  Referring physician: ER Physician PCP: Chevis Pretty, FNP  Outpatient Specialists:    Patient coming from: Home  Chief Complaint: Intermittent confusion  HPI: 60 year old female, presents with intermittent confusion over the last one week. No associated headache, no fever or chills, no neck pain, no chest pain, no SOB, no GI symptoms and no urinary symptoms. CT head done on presentation revealed brain mass said to be likely partially calcified meningioma. Neurosurgery team has asked for the Hospitalist team to admit patient.  ED Course: IV Decadron and IV keppra given.  Pertinent labs: CT findings as above. Imaging: independently reviewed.   Review of Systems: As in HPI. Negative for fever, visual changes, sore throat, rash, new muscle aches, chest pain, SOB, dysuria, bleeding, n/v/abdominal pain.  Past Medical History  Diagnosis Date  . Hemorrhoids   . Bleeding nose   . Sinus problem   . Arthritis   . Allergy   . Cancer [facility])     right breast  . Thyroid disease   . Hyperlipidemia   . Degenerative disk disease 2012    Past Surgical History  Procedure Laterality Date  . Ganglion cyst excision  SEP. 1997    RIGHT WRIST  . Colonoscopy    . Breast surgery  AUG. 2010    right breast lumpectomy  . Breast surgery      Cancer     reports that she has never smoked. She has never used smokeless tobacco. She reports that she does not drink alcohol or use illicit drugs.  No Known Allergies  Family History  Problem Relation Age of Onset  . Cancer Mother     colon  . Colon cancer Mother   . Diabetes Father   . Heart disease Father   . Cancer Father     skin  . Cancer Brother     breast  . Cancer Sister     leukemia  . Cancer Brother     skin  . Cancer Maternal Aunt     ovarian     Prior to Admission medications   Medication Sig Start Date End Date Taking? Authorizing  Provider  acetaminophen-codeine (TYLENOL #3) 300-30 MG tablet TAKE ONE TABLET BY MOUTH EVERY FOUR HOURS AS NEEDED FOR PAIN 08/07/15   Sharion Balloon, FNP  Ascorbic Acid (VITAMIN C) 500 MG tablet Take 500 mg by mouth daily.     Historical Provider, MD  aspirin 81 MG tablet Take 81 mg by mouth daily.      Historical Provider, MD  Calcium-Vitamin D (CALTRATE 600 PLUS-VIT D PO) Take 600 mg by mouth 2 (two) times daily.    Historical Provider, MD  Cholecalciferol (VITAMIN D) 2000 UNITS tablet Take 2,000 Units by mouth 2 (two) times daily.    Historical Provider, MD  cyanocobalamin 100 MCG tablet Take 100 mcg by mouth daily.    Historical Provider, MD  fish oil-omega-3 fatty acids 1000 MG capsule Take 1 g by mouth daily.    Historical Provider, MD  GARLIC PO Take [id number] mg by mouth daily.    Historical Provider, MD  levothyroxine (SYNTHROID, LEVOTHROID) 50 MCG tablet Take 1 tablet (50 mcg total) by mouth daily. 05/19/15   [name] Done, FNP  Multiple Vitamins-Minerals (CENTRUM SILVER PO) Take by mouth.      Historical Provider, MD  oxaprozin (DAYPRO) 600 MG tablet Take 300 mg by mouth daily.  06/29/12  Historical Provider, MD  rosuvastatin (CRESTOR) 10 MG tablet TAKE ONE TABLET BY MOUTH ONE TIME DAILY 09/12/15   Sharion Balloon, FNP    Physical Exam: Filed Vitals:   10/13/15 1630 10/13/15 1645 10/13/15 1706 10/13/15 1715  BP: 121/83 119/90  115/69  Pulse: 81 75    Temp:   97.8 F (36.6 C)   TempSrc:      Resp: 15 20  17   SpO2: 100% 100%     Constitutional:  . Appears calm and comfortable Eyes:  . No pallor. No jaundice.  ENMT:  . external ears, nose appear normal Neck:  . Neck is supple. No JVD Respiratory:  . CTA bilaterally, no w/r/r.  . Respiratory effort normal. No retractions or accessory muscle use Cardiovascular:  . S1S2 . No LE extremity edema   Abdomen:  . Abdomen is soft and non tender. Organs are difficult to assess. Neurologic:  . Awake and alert. . Moves all  limbs.  Wt Readings from Last 3 Encounters:  10/10/15 87.998 kg (194 lb)  10/06/15 86.183 kg (190 lb)  05/19/15 88.451 kg (195 lb)    I have personally reviewed following labs and imaging studies  Labs on Admission:  CBC:  Recent Labs Lab 10/13/15 1520 10/13/15 1547  WBC 8.0  --   NEUTROABS 4.9  --   HGB 13.6 13.6  HCT 40.7 40.0  MCV 91.9  --   PLT 351  --    Basic Metabolic Panel:  Recent Labs Lab 10/13/15 1520 10/13/15 1547  NA 139 141  K 3.9 4.0  CL 105 103  CO2 27  --   GLUCOSE 136* 134*  BUN 14 15  CREATININE 0.60 0.60  CALCIUM 9.8  --    Liver Function Tests:  Recent Labs Lab 10/13/15 1520  AST 32  ALT 30  ALKPHOS 107  BILITOT 0.8  PROT 7.1  ALBUMIN 3.8   No results for input(s): LIPASE, AMYLASE in the last 168 hours. No results for input(s): AMMONIA in the last 168 hours. Coagulation Profile:  Recent Labs Lab 10/13/15 1520  INR 1.03   Cardiac Enzymes: No results for input(s): CKTOTAL, CKMB, CKMBINDEX, TROPONINI in the last 168 hours. BNP (last 3 results) No results for input(s): PROBNP in the last 8760 hours. HbA1C: No results for input(s): HGBA1C in the last 72 hours. CBG:  Recent Labs Lab 10/13/15 1655  GLUCAP 100*   Lipid Profile: No results for input(s): CHOL, HDL, LDLCALC, TRIG, CHOLHDL, LDLDIRECT in the last 72 hours. Thyroid Function Tests: No results for input(s): TSH, T4TOTAL, FREET4, T3FREE, THYROIDAB in the last 72 hours. Anemia Panel: No results for input(s): VITAMINB12, FOLATE, FERRITIN, TIBC, IRON, RETICCTPCT in the last 72 hours. Urine analysis:    Component Value Date/Time   BILIRUBINUR negative 03/24/2013 1109   PROTEINUR negative 03/24/2013 1109   UROBILINOGEN negative 03/24/2013 1109   NITRITE negative 03/24/2013 1109   LEUKOCYTESUR large (3+) 03/24/2013 1109   Sepsis Labs: @LABRCNTIP (procalcitonin:4,lacticidven:4) )No results found for this or any previous visit (from the past 240 hour(s)).     Radiological Exams on Admission: Ct Head Wo Contrast  10/13/2015  CLINICAL DATA:  61 year old female with acute confusion and dysarthria. EXAM: CT HEAD WITHOUT CONTRAST TECHNIQUE: Contiguous axial images were obtained from the base of the skull through the vertex without intravenous contrast. COMPARISON:  None. FINDINGS: A 3.2 x 3.5 x 4.4 cm partially calcified mass in the medial left parietal region is likely extra-axial and likely a meningioma.  A large amount of vasogenic edema in the adjacent brain is noted. A 1.1 x 1.3 x 1 cm right parafalcine meningioma (image 22) and a 6 mm right frontal meningioma (image 12) are noted without adjacent parenchymal edema. There is no evidence of hemorrhage, hydrocephalus or cortical infarct. No bony abnormalities are identified. IMPRESSION: 3.2 x 3.5 x 4.4 cm medial left parietal region mass with large amount of vasogenic edema in the adjacent brain. This mass is likely a meningioma. Small meningiomas in the right parafalcine and right frontal regions. No evidence of adjacent edema or mass effect. No evidence of hemorrhage or acute cortical infarction. These results were discussed with Dr. Kathrynn Humble on 10/13/2015 at 3:54 p.m. Electronically Signed   By: Margarette Canada M.D.   On: 10/13/2015 15:56    Active Problems:   Brain mass   Assessment/Plan 1. Brain Mass   Admit patient to telemetry floor  IV Keppra   IV decadron  MRI brain   Neurosurgery already consulted  DVT prophylaxis: [place] Lovenox Code Status: Full Family Communication: Husband Disposition Plan: To be determined   Consults called: Neurosurgery and Neurology   Admission status: Inpatient    Time spent: 60 minutes  Dana Allan, MD  [facility] Pager #: [phone] 7PM-7AM contact night coverage as above   10/13/2015, 5:39 PM

## 2015-10-13 NOTE — Progress Notes (Signed)
Patient arrived around 2000 alert, oriented no pain will continue to monitor.

## 2015-10-13 NOTE — ED Notes (Signed)
MD Donnal Debar reported patient was okay to go to [place] when notified about patient's complaints chest tightness due to anxiousness.

## 2015-10-13 NOTE — ED Notes (Signed)
Pt reports flu dx last wk & was re evaluated on 10/10/15 d/t "not feeling herself" pt reports that she was told that everything was okay, pt states, "things are not right. Monday I was trying to get out of the car and couldn't get out how to get out." pt A&O x4, follows commands, speaks in complete sentences, no facial droop or dysarthria noted

## 2015-10-13 NOTE — Consult Note (Signed)
Images reviewed and case discussed.  Please admit to hospitalist and obtain MRI brain with and without contrast with brain lab protocol.  Start dexamethasone 10mg  now, then 4q6.  Start Keppra 1000 mg bid.  I will follow along.

## 2015-10-14 ENCOUNTER — Inpatient Hospital Stay (HOSPITAL_COMMUNITY): Payer: Medicare Other

## 2015-10-14 DIAGNOSIS — E039 Hypothyroidism, unspecified: Secondary | ICD-10-CM

## 2015-10-14 DIAGNOSIS — E785 Hyperlipidemia, unspecified: Secondary | ICD-10-CM

## 2015-10-14 DIAGNOSIS — D429 Neoplasm of uncertain behavior of meninges, unspecified: Secondary | ICD-10-CM

## 2015-10-14 LAB — COMPREHENSIVE METABOLIC PANEL
ALT: 28 U/L (ref 14–54)
AST: 28 U/L (ref 15–41)
Albumin: 3.3 g/dL — ABNORMAL LOW (ref 3.5–5.0)
Alkaline Phosphatase: 101 U/L (ref 38–126)
Anion gap: 11 (ref 5–15)
BUN: 16 mg/dL (ref 6–20)
CO2: 23 mmol/L (ref 22–32)
Calcium: 9.9 mg/dL (ref 8.9–10.3)
Chloride: 106 mmol/L (ref 101–111)
Creatinine, Ser: 0.6 mg/dL (ref 0.44–1.00)
GFR calc Af Amer: >60 mL/min (ref >60)
GFR calc non Af Amer: >60 mL/min (ref >60)
Glucose, Bld: 155 mg/dL — ABNORMAL HIGH (ref 65–99)
Potassium: 4.4 mmol/L (ref 3.5–5.1)
Sodium: 140 mmol/L (ref 135–145)
Total Bilirubin: 0.6 mg/dL (ref 0.3–1.2)
Total Protein: 6.8 g/dL (ref 6.5–8.1)

## 2015-10-14 LAB — CBC
HCT: 42.3 % (ref 36.0–46.0)
Hemoglobin: 13.9 g/dL (ref 12.0–15.0)
MCH: 30.2 pg (ref 26.0–34.0)
MCHC: 32.9 g/dL (ref 30.0–36.0)
MCV: 91.8 fL (ref 78.0–100.0)
Platelets: 340 10*3/uL (ref 150–400)
RBC: 4.61 MIL/uL (ref 3.87–5.11)
RDW: 12.1 % (ref 11.5–15.5)
WBC: 9.4 10*3/uL (ref 4.0–10.5)

## 2015-10-14 MED ORDER — GADOBENATE DIMEGLUMINE 529 MG/ML IV SOLN
15.0000 mL | Freq: Once | INTRAVENOUS | Status: AC | PRN
  Administered 2015-10-14: 15 mL via INTRAVENOUS

## 2015-10-14 MED ORDER — LORAZEPAM 2 MG/ML IJ SOLN
1.0000 mg | Freq: Once | INTRAMUSCULAR | Status: AC
  Administered 2015-10-14: 1 mg via INTRAVENOUS
  Filled 2015-10-14: qty 1

## 2015-10-14 MED ORDER — ACETAMINOPHEN 325 MG PO TABS
650.0000 mg | ORAL_TABLET | Freq: Four times a day (QID) | ORAL | Status: DC | PRN
  Administered 2015-10-15: 650 mg via ORAL
  Filled 2015-10-14: qty 2

## 2015-10-14 NOTE — Progress Notes (Signed)
Pt ambulated to the bathroom stand by assist x1. Gait steady, no noted distress. Safety measures in place. Call bell within reach. Will continue to monitor.

## 2015-10-14 NOTE — Consult Note (Signed)
CC:  Chief Complaint  Patient presents with  . Altered Mental Status    HPI: Julia Poole is a 60 y.o. female who presents with a chief complaint of confusion and headaches. Two weeks ago she developed flu-like symptoms for which she sought treatment from her PCP. She then developed confusion and headaches. She notes having difficulty with balance and short-term memory at that time. The patient notes having episodes in which she would freeze and be confused about what she was doing at that point in time. She presented to the ED yesterday for progressive confusion. The patient had an CT and MRI imaging that revealed meningiomas. Today, the patient complains of short-term memory disturbance, mild frontal headache, blurred vision, and slight loss of balance. She denies nausea, vomiting, loss of consciousness, weakness, and seizures.   PMH: Past Medical History  Diagnosis Date  . Hemorrhoids   . Bleeding nose   . Sinus problem   . Arthritis   . Allergy   . Cancer [facility])     right breast  . Thyroid disease   . Hyperlipidemia   . Degenerative disk disease 2012    PSH: Past Surgical History  Procedure Laterality Date  . Ganglion cyst excision  SEP. 1997    RIGHT WRIST  . Colonoscopy    . Breast surgery  AUG. 2010    right breast lumpectomy  . Breast surgery      Cancer    SH: Social History  Substance Use Topics  . Smoking status: Never Smoker   . Smokeless tobacco: Never Used  . Alcohol Use: No    MEDS: Prior to Admission medications   Medication Sig Start Date End Date Taking? Authorizing Provider  acetaminophen-codeine (TYLENOL #3) 300-30 MG tablet TAKE ONE TABLET BY MOUTH EVERY FOUR HOURS AS NEEDED FOR PAIN 08/07/15  Yes Sharion Balloon, FNP  Ascorbic Acid (VITAMIN C) 500 MG tablet Take 500 mg by mouth daily.    Yes Historical Provider, MD  aspirin 81 MG tablet Take 81 mg by mouth daily.     Yes Historical Provider, MD  Calcium-Vitamin D (CALTRATE 600 PLUS-VIT D PO) Take  600 mg by mouth 2 (two) times daily.   Yes Historical Provider, MD  Cholecalciferol (VITAMIN D) 2000 UNITS tablet Take 2,000 Units by mouth 2 (two) times daily.   Yes Historical Provider, MD  cyanocobalamin 100 MCG tablet Take 100 mcg by mouth daily.   Yes Historical Provider, MD  fish oil-omega-3 fatty acids 1000 MG capsule Take 1 g by mouth daily.   Yes Historical Provider, MD  GARLIC PO Take [id number] mg by mouth daily.   Yes Historical Provider, MD  levothyroxine (SYNTHROID, LEVOTHROID) 50 MCG tablet Take 1 tablet (50 mcg total) by mouth daily. 05/19/15  Yes [name] Done, FNP  Multiple Vitamins-Minerals (CENTRUM SILVER PO) Take by mouth.     Yes Historical Provider, MD  oxaprozin (DAYPRO) 600 MG tablet Take 300 mg by mouth daily.  06/29/12  Yes Historical Provider, MD  rosuvastatin (CRESTOR) 10 MG tablet TAKE ONE TABLET BY MOUTH ONE TIME DAILY 09/12/15  Yes Sharion Balloon, FNP    ALLERGY: No Known Allergies  ROS: ROS  NEUROLOGIC EXAM: Awake, alert, oriented Memory and concentration grossly intact Speech fluent, appropriate CN grossly intact Motor exam: Upper Extremities Deltoid Bicep Tricep Grip  Right 5/5 5/5 5/5 5/5  Left 5/5 5/5 5/5 5/5   Lower Extremity IP Quad PF DF EHL  Right 5/5 5/5 5/5 5/5 5/5  Left 5/5 5/5 5/5 5/5 5/5   Sensation grossly intact to LT  IMAGING: I have independently reviewed a non-contrasted CT and a contrasted MRI of the brain.  She has a large, calcified left parietal dural based tumor.  There is substantial vasogenic edema without the parenchyma around this mass.  It does not contact the superior sagittal sinus.  The is a smaller calcified tumor just anterior to this on the right side.  She has multiple small convexity meningiomas.  IMPRESSION: - 60 y.o. female with multiple intracranial meningiomas. She seems to be symptomatic from the largest one.  I have recommended surgery to remove the large left parietal tumor and at the same time I think we  can remove the smaller right parafalcine tumor as well.  Her convexity tumors can be followed with surveillance imaging.  We had a long discussion about the risks and benefits of surgery, as well as the alternatives.  I am specifically concerned about post-operative weakness and/or venous injury/infarcts.  She expressed understanding and wishes to proceed.  PLAN: - Brainlab protocol MRI Monday morning - Left parietal parafalcine craniotomy for tumor resection with Brainlab navigation - Continue steroids and AEDs - Stop aspirin

## 2015-10-14 NOTE — Progress Notes (Signed)
PROGRESS NOTE  Julia Poole  [id number] DOB: May 15, 1955  DOA: 10/13/2015 PCP: Chevis Pretty, FNP   Brief Narrative:  60 year old female, married, lives with spouse, independent of activities of daily living, PMH of hypothyroid, HLD, recently sought treatment for URI with PCP and then a week ago noted intermittent mild frontal headaches, memory impairment, confusion and gait imbalance. As per nephew, noted that she would freeze while doing something in the kitchen, looked blankly for a few seconds then done around to ask what happened. She started using her spouse's cane. In the ED, CT and MRI brain revealed meningiomas with vasogenic edema around the largest one. Neurosurgery is consulted and planned surgery on 10/16/15.   Assessment & Plan:   Active Problems:   Brain mass   Meningiomas - Patient likely symptomatic from the largest one including possible seizure-like activity. - Neurosurgery consultation appreciated: Started on IV Decadron, Keppra and plan for surgery on 7/17 to remove large left parietal tumor and at the same time the smaller parafalcine tumor. Rest to be followed with surveillance imaging. - Discussed with Dr. Cyndy Freeze.  Hypothyroid - Continue Synthroid. Clinically appears euthyroid.  Hyperlipidemia - Continue statins and omega-3    DVT prophylaxis: Lovenox Code Status: Full Family Communication: Discussed with patient's nephew at bedside Disposition Plan: DC possibly to SNF postop   Consultants:   Neurosurgery  Procedures:   None  Antimicrobials:   None    Subjective: Mild intermittent headaches, blurred vision and memory impairment. No history of LOC or falls.  Objective:  Filed Vitals:   10/14/15 0758 10/14/15 0947 10/14/15 1245 10/14/15 1456  BP: 96/43 112/59 126/68 110/57  Pulse: 77 80 80 78  Temp: 98.2 F (36.8 C) 97.9 F (36.6 C) 98.2 F (36.8 C) 98.4 F (36.9 C)  TempSrc: Oral Oral Oral Oral  Resp: 16 16 16 18   Height:       Weight:      SpO2: 98% 98% 98% 99%   No intake or output data in the 24 hours ending 10/14/15 1614 Filed Weights   10/13/15 1946  Weight: 85.4 kg (188 lb 4.4 oz)    Examination:  General exam: Pleasant middle-aged female lying comfortably in bed. Respiratory system: Clear to auscultation. Respiratory effort normal. Cardiovascular system: S1 & S2 heard, RRR. No JVD, murmurs, rubs, gallops or clicks. No pedal edema. Telemetry: Sinus rhythm. Gastrointestinal system: Abdomen is nondistended, soft and nontender. No organomegaly or masses felt. Normal bowel sounds heard. Central nervous system: Alert and oriented. No focal neurological deficits. Extremities: Symmetric 5 x 5 power. Skin: No rashes, lesions or ulcers Psychiatry: Judgement and insight appear normal. Mood & affect appropriate.     Data Reviewed: I have personally reviewed following labs and imaging studies  CBC:  Recent Labs Lab 10/13/15 1520 10/13/15 1547 10/13/15 1953 10/14/15 0632  WBC 8.0  --  7.6 9.4  NEUTROABS 4.9  --   --   --   HGB 13.6 13.6 13.6 13.9  HCT 40.7 40.0 40.6 42.3  MCV 91.9  --  92.3 91.8  PLT 351  --  349 [id number]   Basic Metabolic Panel:  Recent Labs Lab 10/13/15 1520 10/13/15 1547 10/13/15 1953 10/14/15 0632  NA 139 141  --  140  K 3.9 4.0  --  4.4  CL 105 103  --  106  CO2 27  --   --  23  GLUCOSE 136* 134*  --  155*  BUN 14 15  --  16  CREATININE 0.60 0.60 0.63 0.60  CALCIUM 9.8  --   --  9.9  MG  --   --  2.2  --   PHOS  --   --  3.4  --    GFR: Estimated Creatinine Clearance: 79.7 mL/min (by C-G formula based on Cr of 0.6). Liver Function Tests:  Recent Labs Lab 10/13/15 1520 10/14/15 0632  AST 32 28  ALT 30 28  ALKPHOS 107 101  BILITOT 0.8 0.6  PROT 7.1 6.8  ALBUMIN 3.8 3.3*   No results for input(s): LIPASE, AMYLASE in the last 168 hours. No results for input(s): AMMONIA in the last 168 hours. Coagulation Profile:  Recent Labs Lab 10/13/15 1520  INR  1.03   Cardiac Enzymes: No results for input(s): CKTOTAL, CKMB, CKMBINDEX, TROPONINI in the last 168 hours. BNP (last 3 results) No results for input(s): PROBNP in the last 8760 hours. HbA1C: No results for input(s): HGBA1C in the last 72 hours. CBG:  Recent Labs Lab 10/13/15 1655  GLUCAP 100*   Lipid Profile: No results for input(s): CHOL, HDL, LDLCALC, TRIG, CHOLHDL, LDLDIRECT in the last 72 hours. Thyroid Function Tests:  Recent Labs  10/13/15 2002  TSH 2.055   Anemia Panel: No results for input(s): VITAMINB12, FOLATE, FERRITIN, TIBC, IRON, RETICCTPCT in the last 72 hours.  Sepsis Labs: No results for input(s): PROCALCITON, LATICACIDVEN in the last 168 hours.  No results found for this or any previous visit (from the past 240 hour(s)).       Radiology Studies: Ct Head Wo Contrast  10/13/2015  CLINICAL DATA:  60 year old female with acute confusion and dysarthria. EXAM: CT HEAD WITHOUT CONTRAST TECHNIQUE: Contiguous axial images were obtained from the base of the skull through the vertex without intravenous contrast. COMPARISON:  None. FINDINGS: A 3.2 x 3.5 x 4.4 cm partially calcified mass in the medial left parietal region is likely extra-axial and likely a meningioma. A large amount of vasogenic edema in the adjacent brain is noted. A 1.1 x 1.3 x 1 cm right parafalcine meningioma (image 22) and a 6 mm right frontal meningioma (image 12) are noted without adjacent parenchymal edema. There is no evidence of hemorrhage, hydrocephalus or cortical infarct. No bony abnormalities are identified. IMPRESSION: 3.2 x 3.5 x 4.4 cm medial left parietal region mass with large amount of vasogenic edema in the adjacent brain. This mass is likely a meningioma. Small meningiomas in the right parafalcine and right frontal regions. No evidence of adjacent edema or mass effect. No evidence of hemorrhage or acute cortical infarction. These results were discussed with Dr. Kathrynn Humble on 10/13/2015  at 3:54 p.m. Electronically Signed   By: Margarette Canada M.D.   On: 10/13/2015 15:56   Mr Jeri Cos [id number] Contrast  10/14/2015  CLINICAL DATA:  Initial evaluation for EXAM: MRI HEAD WITHOUT AND WITH CONTRAST TECHNIQUE: Multiplanar, multiecho pulse sequences of the brain and surrounding structures were obtained without and with intravenous contrast. CONTRAST:  17mL MULTIHANCE GADOBENATE DIMEGLUMINE 529 MG/ML IV SOLN COMPARISON:  Prior CT from 10/13/2015. FINDINGS: Cerebral volume within normal limits for patient age. No significant cerebral white matter disease present. Previously identified large left parafalcine mass centered at the parasagittal left parietal lobe again seen. Mass is somewhat lobulated and well-circumscribed, measuring 3.1 x 3.8 x 3.6 cm (AP by transverse by craniocaudad). Lesion demonstrates I said with the hypo intense T2 signal intensity, isointense T1 signal intensity, with fairly avid 8 post-contrast enhancement. Internal susceptibility artifact and data bowl with  previously seen calcification. There is an associated dural tail along the falx. Lesion slightly crosses the midline. Lesion is positioned approximate 13 mm inferior to the superior sagittal sinus. There is a faint T2 cleft of CSF surrounding the inferior margin this lesion, suggesting at this is extra-axial. Finding most consistent with a probable partially calcified meningioma. There is associated vasogenic edema within the adjacent left parietal region with mild mass effect on the atrium of the left lateral ventricle. No significant midline shift. Additional right parasagittal meningioma located slightly more superiorly and anteriorly which measures 1.5 x 1.1 x 1.3 cm. No significant edema about this lesion. Additional small 7 mm meningioma at the right frontal convexity (series 12, image 28). Additional small 1 cm meningioma at the left frontoparietal convexity (series 12, image 35). No significant edema about these lesions I tear. No  other mass lesion or abnormal enhancement. No hydrocephalus. No extra-axial fluid collection. No evidence for acute infarct. Major intracranial vascular flow voids are maintained. No acute intracranial hemorrhage. Major dural sinuses are grossly patent. Craniocervical junction normal. Visualized upper cervical spine demonstrates minimal multilevel degenerative spondylolysis without significant stenosis. Pituitary gland normal. No acute abnormality about the globes and orbits. Paranasal sinuses are clear. Trace opacity within the left mastoid air cells. Mastoid air cells are otherwise clear. Inner ear structures normal. Bone marrow signal intensity within normal limits. No scalp soft tissue abnormality. IMPRESSION: 1. 3.1 x 3.8 x 3.6 cm left parafalcine meningioma within the left parietal lobe with associated vasogenic edema. No midline shift. 2. Smaller 1.5 x 1.1 x 1.3 cm right parafalcine meningioma, with additional smaller meningiomas overlying the right frontal and left parietal convexities as above. No associated edema about these additional lesions. Electronically Signed   By: Jeannine Boga M.D.   On: 10/14/2015 05:44        Scheduled Meds: . dexamethasone  4 mg Intravenous Q6H  . enoxaparin (LOVENOX) injection  40 mg Subcutaneous Q24H  . levETIRAcetam  1,000 mg Intravenous Q12H  . levothyroxine  50 mcg Oral QAC breakfast  . omega-3 acid ethyl esters  1 g Oral Daily  . rosuvastatin  10 mg Oral Daily  . vitamin B-12  100 mcg Oral Daily   Continuous Infusions:    LOS: 1 day    Time spent: 30 minutes.    [facility], MD [facility] Pager [phone]  If 7PM-7AM, please contact night-coverage [email or web address] Password [facility] 10/14/2015, 4:14 PM

## 2015-10-14 NOTE — Progress Notes (Signed)
Pt went for MRI around 0140 did not return until after 0300 Q 2 N/V was done as soon as they returned. Since receiving 1 mg of ativan pt. BP has been soft and O2 sat has been low RR has dropped some she is asymtmatic put her on 2 litres O2 nasal canula will continue to monitor.

## 2015-10-15 LAB — PROTIME-INR
INR: 1.11 (ref 0.00–1.49)
PROTHROMBIN TIME: 14.5 s (ref 11.6–15.2)

## 2015-10-15 LAB — GLUCOSE, CAPILLARY: Glucose-Capillary: 203 mg/dL — ABNORMAL HIGH (ref 65–99)

## 2015-10-15 LAB — APTT: APTT: 24 s (ref 24–37)

## 2015-10-15 LAB — ABO/RH: ABO/RH(D): A POS

## 2015-10-15 LAB — TYPE AND SCREEN
ABO/RH(D): A POS
Antibody Screen: NEGATIVE

## 2015-10-15 MED ORDER — LEVETIRACETAM 500 MG PO TABS
1000.0000 mg | ORAL_TABLET | Freq: Two times a day (BID) | ORAL | Status: DC
  Administered 2015-10-15 – 2015-10-18 (×4): 1000 mg via ORAL
  Filled 2015-10-15 (×5): qty 2

## 2015-10-15 MED ORDER — MANNITOL 25 % IV SOLN
100.0000 g | Freq: Once | INTRAVENOUS | Status: DC
  Filled 2015-10-15 (×2): qty 400

## 2015-10-15 MED ORDER — CEFAZOLIN SODIUM-DEXTROSE 2-4 GM/100ML-% IV SOLN
2.0000 g | INTRAVENOUS | Status: AC
  Administered 2015-10-16: 2 g via INTRAVENOUS
  Filled 2015-10-15: qty 100

## 2015-10-15 NOTE — Progress Notes (Signed)
No acute events Stable Had a long discussion with the patient and her family.  Answered all of their questions.  They wish to proceed tomorrow. NPO after midnight.

## 2015-10-15 NOTE — Progress Notes (Signed)
PROGRESS NOTE  Julia Poole  [id number] DOB: [birth year]  DOA: 10/13/2015 PCP: Chevis Pretty, FNP   Brief Narrative:  60 year old female, married, lives with spouse, independent of activities of daily living, PMH of hypothyroid, HLD, recently sought treatment for URI with PCP and then a week ago noted intermittent mild frontal headaches, memory impairment, confusion and gait imbalance. As per nephew, noted that she would freeze while doing something in the kitchen, looked blankly for a few seconds then done around to ask what happened. She started using her spouse's cane. In the ED, CT and MRI brain revealed meningiomas with vasogenic edema around the largest one. Neurosurgery has consulted and planned surgery on 10/16/15.   Assessment & Plan:   Active Problems:   Brain mass   Meningiomas - Patient likely symptomatic from the largest one including possible seizure-like activity. - Neurosurgery consultation appreciated: Started on IV Decadron, Keppra and plan for surgery on 7/17 to remove large left parietal tumor and at the same time the smaller parafalcine tumor. Rest to be followed with surveillance imaging. - Discussed with Dr. Cyndy Freeze.  Hypothyroid - Continue Synthroid. Clinically appears euthyroid. TSH normal.  Hyperlipidemia - Continue statins and omega-3    DVT prophylaxis: Lovenox Code Status: Full Family Communication: Discussed with patient's son at bedside Disposition Plan: DC possibly to SNF postop   Consultants:   Neurosurgery  Procedures:   None  Antimicrobials:   None    Subjective: Mild intermittent headaches, blurred vision and memory impairment. No history of LOC or falls. Overall slightly better. Denies new complaints.  Objective:  Filed Vitals:   10/14/15 2114 10/15/15 0135 10/15/15 0531 10/15/15 0930  BP: 117/65 108/58 111/64 118/62  Pulse: 83 73 81 84  Temp: 98.3 F (36.8 C) 98.4 F (36.9 C) 97.8 F (36.6 C) 98.1 F (36.7 C)    TempSrc: Oral Oral Oral Oral  Resp: 17 18 18 18   Height:      Weight:      SpO2: 97% 99% 98% 100%   No intake or output data in the 24 hours ending 10/15/15 1439 Filed Weights   10/13/15 1946  Weight: 85.4 kg (188 lb 4.4 oz)    Examination:  General exam: Pleasant middle-aged female lying comfortably in bed.Patient's son at bedside. Respiratory system: Clear to auscultation. Respiratory effort normal. Cardiovascular system: S1 & S2 heard, RRR. No JVD, murmurs, rubs, gallops or clicks. No pedal edema. Gastrointestinal system: Abdomen is nondistended, soft and nontender. No organomegaly or masses felt. Normal bowel sounds heard. Central nervous system: Alert and oriented. No focal neurological deficits. Extremities: Symmetric 5 x 5 power. Skin: No rashes, lesions or ulcers Psychiatry: Judgement and insight appear normal. Mood & affect appropriate.     Data Reviewed: I have personally reviewed following labs and imaging studies  CBC:  Recent Labs Lab 10/13/15 1520 10/13/15 1547 10/13/15 1953 10/14/15 0632  WBC 8.0  --  7.6 9.4  NEUTROABS 4.9  --   --   --   HGB 13.6 13.6 13.6 13.9  HCT 40.7 40.0 40.6 42.3  MCV 91.9  --  92.3 91.8  PLT 351  --  349 [id number]   Basic Metabolic Panel:  Recent Labs Lab 10/13/15 1520 10/13/15 1547 10/13/15 1953 10/14/15 0632  NA 139 141  --  140  K 3.9 4.0  --  4.4  CL 105 103  --  106  CO2 27  --   --  23  GLUCOSE 136* 134*  --  155*  BUN 14 15  --  16  CREATININE 0.60 0.60 0.63 0.60  CALCIUM 9.8  --   --  9.9  MG  --   --  2.2  --   PHOS  --   --  3.4  --    GFR: Estimated Creatinine Clearance: 79.7 mL/min (by C-G formula based on Cr of 0.6). Liver Function Tests:  Recent Labs Lab 10/13/15 1520 10/14/15 0632  AST 32 28  ALT 30 28  ALKPHOS 107 101  BILITOT 0.8 0.6  PROT 7.1 6.8  ALBUMIN 3.8 3.3*   No results for input(s): LIPASE, AMYLASE in the last 168 hours. No results for input(s): AMMONIA in the last 168  hours. Coagulation Profile:  Recent Labs Lab 10/13/15 1520 10/15/15 1127  INR 1.03 1.11   Cardiac Enzymes: No results for input(s): CKTOTAL, CKMB, CKMBINDEX, TROPONINI in the last 168 hours. BNP (last 3 results) No results for input(s): PROBNP in the last 8760 hours. HbA1C: No results for input(s): HGBA1C in the last 72 hours. CBG:  Recent Labs Lab 10/13/15 1655  GLUCAP 100*   Lipid Profile: No results for input(s): CHOL, HDL, LDLCALC, TRIG, CHOLHDL, LDLDIRECT in the last 72 hours. Thyroid Function Tests:  Recent Labs  10/13/15 2002  TSH 2.055   Anemia Panel: No results for input(s): VITAMINB12, FOLATE, FERRITIN, TIBC, IRON, RETICCTPCT in the last 72 hours.  Sepsis Labs: No results for input(s): PROCALCITON, LATICACIDVEN in the last 168 hours.  No results found for this or any previous visit (from the past 240 hour(s)).       Radiology Studies: Ct Head Wo Contrast  10/13/2015  CLINICAL DATA:  60 year old female with acute confusion and dysarthria. EXAM: CT HEAD WITHOUT CONTRAST TECHNIQUE: Contiguous axial images were obtained from the base of the skull through the vertex without intravenous contrast. COMPARISON:  None. FINDINGS: A 3.2 x 3.5 x 4.4 cm partially calcified mass in the medial left parietal region is likely extra-axial and likely a meningioma. A large amount of vasogenic edema in the adjacent brain is noted. A 1.1 x 1.3 x 1 cm right parafalcine meningioma (image 22) and a 6 mm right frontal meningioma (image 12) are noted without adjacent parenchymal edema. There is no evidence of hemorrhage, hydrocephalus or cortical infarct. No bony abnormalities are identified. IMPRESSION: 3.2 x 3.5 x 4.4 cm medial left parietal region mass with large amount of vasogenic edema in the adjacent brain. This mass is likely a meningioma. Small meningiomas in the right parafalcine and right frontal regions. No evidence of adjacent edema or mass effect. No evidence of hemorrhage or  acute cortical infarction. These results were discussed with Dr. Kathrynn Humble on 10/13/2015 at 3:54 p.m. Electronically Signed   By: Margarette Canada M.D.   On: 10/13/2015 15:56   Mr Jeri Cos [id number] Contrast  10/14/2015  CLINICAL DATA:  Initial evaluation for EXAM: MRI HEAD WITHOUT AND WITH CONTRAST TECHNIQUE: Multiplanar, multiecho pulse sequences of the brain and surrounding structures were obtained without and with intravenous contrast. CONTRAST:  66mL MULTIHANCE GADOBENATE DIMEGLUMINE 529 MG/ML IV SOLN COMPARISON:  Prior CT from 10/13/2015. FINDINGS: Cerebral volume within normal limits for patient age. No significant cerebral white matter disease present. Previously identified large left parafalcine mass centered at the parasagittal left parietal lobe again seen. Mass is somewhat lobulated and well-circumscribed, measuring 3.1 x 3.8 x 3.6 cm (AP by transverse by craniocaudad). Lesion demonstrates I said with the hypo intense T2 signal intensity, isointense T1 signal intensity,  with fairly avid 8 post-contrast enhancement. Internal susceptibility artifact and data bowl with previously seen calcification. There is an associated dural tail along the falx. Lesion slightly crosses the midline. Lesion is positioned approximate 13 mm inferior to the superior sagittal sinus. There is a faint T2 cleft of CSF surrounding the inferior margin this lesion, suggesting at this is extra-axial. Finding most consistent with a probable partially calcified meningioma. There is associated vasogenic edema within the adjacent left parietal region with mild mass effect on the atrium of the left lateral ventricle. No significant midline shift. Additional right parasagittal meningioma located slightly more superiorly and anteriorly which measures 1.5 x 1.1 x 1.3 cm. No significant edema about this lesion. Additional small 7 mm meningioma at the right frontal convexity (series 12, image 28). Additional small 1 cm meningioma at the left  frontoparietal convexity (series 12, image 35). No significant edema about these lesions I tear. No other mass lesion or abnormal enhancement. No hydrocephalus. No extra-axial fluid collection. No evidence for acute infarct. Major intracranial vascular flow voids are maintained. No acute intracranial hemorrhage. Major dural sinuses are grossly patent. Craniocervical junction normal. Visualized upper cervical spine demonstrates minimal multilevel degenerative spondylolysis without significant stenosis. Pituitary gland normal. No acute abnormality about the globes and orbits. Paranasal sinuses are clear. Trace opacity within the left mastoid air cells. Mastoid air cells are otherwise clear. Inner ear structures normal. Bone marrow signal intensity within normal limits. No scalp soft tissue abnormality. IMPRESSION: 1. 3.1 x 3.8 x 3.6 cm left parafalcine meningioma within the left parietal lobe with associated vasogenic edema. No midline shift. 2. Smaller 1.5 x 1.1 x 1.3 cm right parafalcine meningioma, with additional smaller meningiomas overlying the right frontal and left parietal convexities as above. No associated edema about these additional lesions. Electronically Signed   By: Jeannine Boga M.D.   On: 10/14/2015 05:44        Scheduled Meds: . [START ON 10/16/2015]  ceFAZolin (ANCEF) IV  2 g Intravenous To SS-Surg  . dexamethasone  4 mg Intravenous Q6H  . levETIRAcetam  1,000 mg Oral BID  . levothyroxine  50 mcg Oral QAC breakfast  . [START ON 10/16/2015] mannitol  100 g Intravenous Once  . omega-3 acid ethyl esters  1 g Oral Daily  . rosuvastatin  10 mg Oral Daily  . vitamin B-12  100 mcg Oral Daily   Continuous Infusions:    LOS: 2 days    Time spent: 30 minutes.    [facility], MD [facility] Pager [phone]  If 7PM-7AM, please contact night-coverage [email or web address] Password [facility] 10/15/2015, 2:39 PM

## 2015-10-16 ENCOUNTER — Inpatient Hospital Stay (HOSPITAL_COMMUNITY): Payer: Medicare Other | Admitting: Anesthesiology

## 2015-10-16 ENCOUNTER — Encounter (HOSPITAL_COMMUNITY): Payer: Self-pay | Admitting: Certified Registered Nurse Anesthetist

## 2015-10-16 ENCOUNTER — Encounter (HOSPITAL_COMMUNITY)
Admission: EM | Disposition: A | Discharge status: Home Health | Payer: Self-pay | Source: Home / Self Care | Attending: Internal Medicine

## 2015-10-16 ENCOUNTER — Inpatient Hospital Stay (HOSPITAL_COMMUNITY): Payer: Medicare Other

## 2015-10-16 HISTORY — PX: CRANIOTOMY: SHX93

## 2015-10-16 LAB — GLUCOSE, CAPILLARY
Glucose-Capillary: 137 mg/dL — ABNORMAL HIGH (ref 65–99)
Glucose-Capillary: 129 mg/dL — ABNORMAL HIGH (ref 65–99)

## 2015-10-16 SURGERY — CRANIOTOMY TUMOR EXCISION
Anesthesia: General | Site: Head | Laterality: Left

## 2015-10-16 MED ORDER — LIDOCAINE 2% (20 MG/ML) 5 ML SYRINGE
INTRAMUSCULAR | Status: DC | PRN
  Administered 2015-10-16: 100 mg via INTRAVENOUS

## 2015-10-16 MED ORDER — ONDANSETRON HCL 4 MG PO TABS: 4.0000 mg | ORAL_TABLET | ORAL | Status: DC | PRN

## 2015-10-16 MED ORDER — PHENYLEPHRINE 40 MCG/ML (10ML) SYRINGE FOR IV PUSH (FOR BLOOD PRESSURE SUPPORT)
PREFILLED_SYRINGE | INTRAVENOUS | Status: DC | PRN
  Administered 2015-10-16 (×5): 80 ug via INTRAVENOUS

## 2015-10-16 MED ORDER — PHENYLEPHRINE HCL 10 MG/ML IJ SOLN
10.0000 mg | INTRAVENOUS | Status: DC | PRN
  Administered 2015-10-16: 25 ug/min via INTRAVENOUS

## 2015-10-16 MED ORDER — ARTIFICIAL TEARS OP OINT
TOPICAL_OINTMENT | OPHTHALMIC | Status: AC
  Filled 2015-10-16: qty 7

## 2015-10-16 MED ORDER — THROMBIN 20000 UNITS EX SOLR
CUTANEOUS | Status: DC | PRN
  Administered 2015-10-16: 20 mL via TOPICAL

## 2015-10-16 MED ORDER — SODIUM CHLORIDE 0.9 % IV SOLN
INTRAVENOUS | Status: DC
  Administered 2015-10-17: 03:00:00 via INTRAVENOUS

## 2015-10-16 MED ORDER — DOCUSATE SODIUM 100 MG PO CAPS
100.0000 mg | ORAL_CAPSULE | Freq: Two times a day (BID) | ORAL | Status: DC
  Administered 2015-10-16 – 2015-10-18 (×4): 100 mg via ORAL
  Filled 2015-10-16 (×4): qty 1

## 2015-10-16 MED ORDER — FENTANYL CITRATE (PF) 100 MCG/2ML IJ SOLN
INTRAMUSCULAR | Status: DC | PRN
  Administered 2015-10-16: 150 ug via INTRAVENOUS
  Administered 2015-10-16: 50 ug via INTRAVENOUS

## 2015-10-16 MED ORDER — CEFAZOLIN SODIUM-DEXTROSE 2-4 GM/100ML-% IV SOLN
INTRAVENOUS | Status: AC
  Filled 2015-10-16: qty 100

## 2015-10-16 MED ORDER — SODIUM CHLORIDE 0.9 % IV SOLN
0.0125 ug/kg/min | INTRAVENOUS | Status: AC
  Filled 2015-10-16: qty 2000

## 2015-10-16 MED ORDER — VECURONIUM BROMIDE 10 MG IV SOLR
INTRAVENOUS | Status: DC | PRN
  Administered 2015-10-16: 1 mg via INTRAVENOUS
  Administered 2015-10-16: 3 mg via INTRAVENOUS
  Administered 2015-10-16: 7 mg via INTRAVENOUS
  Administered 2015-10-16: 2 mg via INTRAVENOUS
  Administered 2015-10-16 (×2): 3 mg via INTRAVENOUS

## 2015-10-16 MED ORDER — PHENYLEPHRINE HCL 10 MG/ML IJ SOLN: INTRAMUSCULAR | Status: DC | PRN

## 2015-10-16 MED ORDER — PROPOFOL 10 MG/ML IV BOLUS
INTRAVENOUS | Status: AC
  Filled 2015-10-16: qty 40

## 2015-10-16 MED ORDER — MIDAZOLAM HCL 2 MG/2ML IJ SOLN
INTRAMUSCULAR | Status: AC
  Filled 2015-10-16: qty 2

## 2015-10-16 MED ORDER — FENTANYL CITRATE (PF) 250 MCG/5ML IJ SOLN
INTRAMUSCULAR | Status: AC
  Filled 2015-10-16: qty 5

## 2015-10-16 MED ORDER — SODIUM CHLORIDE 0.9 % IV SOLN
INTRAVENOUS | Status: DC | PRN
  Administered 2015-10-16: 17:00:00 via INTRAVENOUS

## 2015-10-16 MED ORDER — BACITRACIN ZINC 500 UNIT/GM EX OINT
TOPICAL_OINTMENT | CUTANEOUS | Status: DC | PRN
  Administered 2015-10-16: 1 via TOPICAL

## 2015-10-16 MED ORDER — VECURONIUM BROMIDE 10 MG IV SOLR
INTRAVENOUS | Status: AC
  Filled 2015-10-16: qty 30

## 2015-10-16 MED ORDER — NALOXONE HCL 0.4 MG/ML IJ SOLN: 0.0800 mg | INTRAMUSCULAR | Status: DC | PRN

## 2015-10-16 MED ORDER — FLEET ENEMA 7-19 GM/118ML RE ENEM: 1.0000 | ENEMA | Freq: Once | RECTAL | Status: DC | PRN

## 2015-10-16 MED ORDER — SODIUM CHLORIDE 0.9 % IV SOLN
INTRAVENOUS | Status: DC
  Filled 2015-10-16: qty 3000

## 2015-10-16 MED ORDER — ONDANSETRON HCL 4 MG/2ML IJ SOLN: 4.0000 mg | Freq: Once | INTRAMUSCULAR | Status: DC | PRN

## 2015-10-16 MED ORDER — SODIUM CHLORIDE 0.9 % IV SOLN
INTRAVENOUS | Status: DC | PRN
  Administered 2015-10-16 (×2): via INTRAVENOUS

## 2015-10-16 MED ORDER — DEXAMETHASONE SODIUM PHOSPHATE 10 MG/ML IJ SOLN
INTRAMUSCULAR | Status: AC
  Filled 2015-10-16: qty 1

## 2015-10-16 MED ORDER — FENTANYL CITRATE (PF) 100 MCG/2ML IJ SOLN: 25.0000 ug | INTRAMUSCULAR | Status: DC | PRN

## 2015-10-16 MED ORDER — FUROSEMIDE 10 MG/ML IJ SOLN
INTRAMUSCULAR | Status: AC
  Administered 2015-10-16: 10 mg via INTRAMUSCULAR
  Filled 2015-10-16: qty 4

## 2015-10-16 MED ORDER — SENNA 8.6 MG PO TABS
1.0000 | ORAL_TABLET | Freq: Two times a day (BID) | ORAL | Status: DC
  Administered 2015-10-16 – 2015-10-18 (×4): 8.6 mg via ORAL
  Filled 2015-10-16 (×4): qty 1

## 2015-10-16 MED ORDER — SODIUM CHLORIDE 0.9 % IJ SOLN
INTRAMUSCULAR | Status: AC
  Filled 2015-10-16: qty 30

## 2015-10-16 MED ORDER — EPHEDRINE 5 MG/ML INJ
INTRAVENOUS | Status: AC
  Filled 2015-10-16: qty 20

## 2015-10-16 MED ORDER — SODIUM CHLORIDE 0.9 % IV SOLN
1000.0000 mg | INTRAVENOUS | Status: AC
  Administered 2015-10-16: 1000 mg via INTRAVENOUS
  Filled 2015-10-16: qty 10

## 2015-10-16 MED ORDER — HEMOSTATIC AGENTS (NO CHARGE) OPTIME
TOPICAL | Status: DC | PRN
  Administered 2015-10-16: 1 via TOPICAL

## 2015-10-16 MED ORDER — MANNITOL 25 % IV SOLN
100.0000 g | INTRAVENOUS | Status: AC
Start: 2015-10-16 — End: 2015-10-16
  Administered 2015-10-16: 100 g via INTRAVENOUS
  Filled 2015-10-16: qty 400

## 2015-10-16 MED ORDER — LIDOCAINE-EPINEPHRINE 2 %-1:100000 IJ SOLN
INTRAMUSCULAR | Status: DC | PRN
  Administered 2015-10-16: 15 mL via INTRADERMAL

## 2015-10-16 MED ORDER — BISACODYL 5 MG PO TBEC: 5.0000 mg | DELAYED_RELEASE_TABLET | Freq: Every day | ORAL | Status: DC | PRN

## 2015-10-16 MED ORDER — ALBUMIN HUMAN 5 % IV SOLN
INTRAVENOUS | Status: DC | PRN
  Administered 2015-10-16 (×2): via INTRAVENOUS

## 2015-10-16 MED ORDER — PROPOFOL 10 MG/ML IV BOLUS
INTRAVENOUS | Status: DC | PRN
  Administered 2015-10-16: 50 mg via INTRAVENOUS
  Administered 2015-10-16: 100 mg via INTRAVENOUS
  Administered 2015-10-16: 50 mg via INTRAVENOUS

## 2015-10-16 MED ORDER — EPHEDRINE SULFATE-NACL 50-0.9 MG/10ML-% IV SOSY
PREFILLED_SYRINGE | INTRAVENOUS | Status: DC | PRN
  Administered 2015-10-16 (×3): 5 mg via INTRAVENOUS

## 2015-10-16 MED ORDER — MIDAZOLAM HCL 5 MG/5ML IJ SOLN
INTRAMUSCULAR | Status: DC | PRN
  Administered 2015-10-16: 2 mg via INTRAVENOUS

## 2015-10-16 MED ORDER — LORAZEPAM 2 MG/ML IJ SOLN
1.0000 mg | Freq: Once | INTRAMUSCULAR | Status: AC
  Administered 2015-10-16: 1 mg via INTRAVENOUS
  Filled 2015-10-16: qty 1

## 2015-10-16 MED ORDER — CEFAZOLIN SODIUM-DEXTROSE 2-4 GM/100ML-% IV SOLN
2.0000 g | Freq: Three times a day (TID) | INTRAVENOUS | Status: AC
  Administered 2015-10-17 (×2): 2 g via INTRAVENOUS
  Filled 2015-10-16 (×2): qty 100

## 2015-10-16 MED ORDER — BUPIVACAINE-EPINEPHRINE (PF) 0.5% -1:200000 IJ SOLN
INTRAMUSCULAR | Status: DC | PRN
  Administered 2015-10-16: 15 mL via PERINEURAL

## 2015-10-16 MED ORDER — GELATIN ABSORBABLE MT POWD
OROMUCOSAL | Status: DC | PRN
  Administered 2015-10-16: 5 mL via TOPICAL

## 2015-10-16 MED ORDER — PHENYLEPHRINE 40 MCG/ML (10ML) SYRINGE FOR IV PUSH (FOR BLOOD PRESSURE SUPPORT)
PREFILLED_SYRINGE | INTRAVENOUS | Status: AC
  Filled 2015-10-16: qty 20

## 2015-10-16 MED ORDER — ONDANSETRON HCL 4 MG/2ML IJ SOLN: 4.0000 mg | INTRAMUSCULAR | Status: DC | PRN

## 2015-10-16 MED ORDER — PROMETHAZINE HCL 25 MG PO TABS: 12.5000 mg | ORAL_TABLET | ORAL | Status: DC | PRN

## 2015-10-16 MED ORDER — MICROFIBRILLAR COLL HEMOSTAT EX POWD
CUTANEOUS | Status: DC | PRN
  Administered 2015-10-16: 1 g via TOPICAL

## 2015-10-16 MED ORDER — SODIUM CHLORIDE 0.9 % IV SOLN
0.0125 ug/kg/min | INTRAVENOUS | Status: AC
  Administered 2015-10-16: .1 ug/kg/min via INTRAVENOUS
  Filled 2015-10-16: qty 2000

## 2015-10-16 MED ORDER — SODIUM CHLORIDE 0.9 % IR SOLN
Status: DC | PRN
  Administered 2015-10-16: 17:00:00

## 2015-10-16 MED ORDER — HYDROMORPHONE HCL 1 MG/ML IJ SOLN
0.5000 mg | INTRAMUSCULAR | Status: DC | PRN
Start: 2015-10-16 — End: 2015-10-18
  Administered 2015-10-17: 1 mg via INTRAVENOUS
  Filled 2015-10-16: qty 1

## 2015-10-16 MED ORDER — 0.9 % SODIUM CHLORIDE (POUR BTL) OPTIME
TOPICAL | Status: DC | PRN
  Administered 2015-10-16 (×3): 1000 mL

## 2015-10-16 MED ORDER — GADOBENATE DIMEGLUMINE 529 MG/ML IV SOLN
18.0000 mL | Freq: Once | INTRAVENOUS | Status: AC | PRN
  Administered 2015-10-16: 18 mL via INTRAVENOUS

## 2015-10-16 MED ORDER — SUGAMMADEX SODIUM 200 MG/2ML IV SOLN
INTRAVENOUS | Status: DC | PRN
  Administered 2015-10-16: 200 mg via INTRAVENOUS

## 2015-10-16 MED ORDER — THROMBIN 5000 UNITS EX SOLR
OROMUCOSAL | Status: DC | PRN
  Administered 2015-10-16: 5 mL via TOPICAL

## 2015-10-16 MED ORDER — MICROFIBRILLAR COLL HEMOSTAT EX PADS
MEDICATED_PAD | CUTANEOUS | Status: DC | PRN
  Administered 2015-10-16: 1 via TOPICAL

## 2015-10-16 MED ORDER — ONDANSETRON HCL 4 MG/2ML IJ SOLN
INTRAMUSCULAR | Status: DC | PRN
  Administered 2015-10-16: 4 mg via INTRAVENOUS

## 2015-10-16 MED ORDER — HYDROCODONE-ACETAMINOPHEN 5-325 MG PO TABS
1.0000 | ORAL_TABLET | ORAL | Status: DC | PRN
  Administered 2015-10-16 – 2015-10-18 (×8): 1 via ORAL
  Filled 2015-10-16 (×8): qty 1

## 2015-10-16 MED ORDER — HYDRALAZINE HCL 20 MG/ML IJ SOLN: 5.0000 mg | INTRAMUSCULAR | Status: DC | PRN

## 2015-10-16 MED ORDER — ONDANSETRON HCL 4 MG/2ML IJ SOLN
INTRAMUSCULAR | Status: AC
  Filled 2015-10-16: qty 4

## 2015-10-16 MED ORDER — LIDOCAINE 2% (20 MG/ML) 5 ML SYRINGE
INTRAMUSCULAR | Status: AC
  Filled 2015-10-16: qty 15

## 2015-10-16 MED ORDER — PANTOPRAZOLE SODIUM 20 MG PO TBEC
20.0000 mg | DELAYED_RELEASE_TABLET | Freq: Every day | ORAL | Status: DC
  Administered 2015-10-17 – 2015-10-18 (×2): 20 mg via ORAL
  Filled 2015-10-16 (×2): qty 1

## 2015-10-16 MED ORDER — DEXAMETHASONE SODIUM PHOSPHATE 10 MG/ML IJ SOLN
INTRAMUSCULAR | Status: DC | PRN
  Administered 2015-10-16: 10 mg via INTRAVENOUS

## 2015-10-16 SURGICAL SUPPLY — 86 items
APPLICATOR CHLORAPREP 3ML ORNG (MISCELLANEOUS) ×3 IMPLANT
BATTERY IQ STERILE (MISCELLANEOUS) ×3 IMPLANT
BENZOIN TINCTURE PRP APPL 2/3 (GAUZE/BANDAGES/DRESSINGS) IMPLANT
BLADE CLIPPER SURG (BLADE) ×3 IMPLANT
BLADE ULTRA TIP 2M (BLADE) ×3 IMPLANT
BNDG GAUZE ELAST 4 BULKY (GAUZE/BANDAGES/DRESSINGS) IMPLANT
BRUSH SCRUB EZ 1% IODOPHOR (MISCELLANEOUS) ×3 IMPLANT
BUR ACORN 6.0 PRECISION (BURR) ×2 IMPLANT
BUR ACORN 6.0MM PRECISION (BURR) ×1
BUR ADDG 1.1 (BURR) IMPLANT
BUR ADDG 1.1MM (BURR)
BUR MATCHSTICK NEURO 3.0 LAGG (BURR) IMPLANT
BUR SPIRAL ROUTER 2.3 (BUR) IMPLANT
BUR SPIRAL ROUTER 2.3MM (BUR)
CANISTER SUCT 3000ML PPV (MISCELLANEOUS) ×3 IMPLANT
CATH ROBINSON RED A/P 14FR (CATHETERS) IMPLANT
CLIP TI MEDIUM 6 (CLIP) IMPLANT
DRAIN SNY WOU 7FLT (WOUND CARE) IMPLANT
DRAPE NEUROLOGICAL W/INCISE (DRAPES) ×3 IMPLANT
DRAPE SHEET LG 3/4 BI-LAMINATE (DRAPES) ×6 IMPLANT
DRAPE SURG 17X23 STRL (DRAPES) IMPLANT
DRAPE WARM FLUID 44X44 (DRAPE) ×3 IMPLANT
DRSG TELFA 3X8 NADH (GAUZE/BANDAGES/DRESSINGS) ×3 IMPLANT
ELECT NEEDLE TIP 2.8 STRL (NEEDLE) ×3 IMPLANT
ELECT REM PT RETURN 9FT ADLT (ELECTROSURGICAL) ×3
ELECTRODE REM PT RTRN 9FT ADLT (ELECTROSURGICAL) ×1 IMPLANT
EVACUATOR 1/8 PVC DRAIN (DRAIN) IMPLANT
EVACUATOR SILICONE 100CC (DRAIN) IMPLANT
FORCEPS BIPOLAR SPETZLER 8 1.0 (NEUROSURGERY SUPPLIES) ×3 IMPLANT
GAUZE SPONGE 4X4 12PLY STRL (GAUZE/BANDAGES/DRESSINGS) ×3 IMPLANT
GAUZE SPONGE 4X4 16PLY XRAY LF (GAUZE/BANDAGES/DRESSINGS) IMPLANT
GLOVE BIO SURGEON STRL SZ 6.5 (GLOVE) ×4 IMPLANT
GLOVE BIO SURGEON STRL SZ7 (GLOVE) ×3 IMPLANT
GLOVE BIO SURGEON STRL SZ8 (GLOVE) ×3 IMPLANT
GLOVE BIO SURGEONS STRL SZ 6.5 (GLOVE) ×2
GLOVE BIOGEL PI IND STRL 7.5 (GLOVE) ×1 IMPLANT
GLOVE BIOGEL PI INDICATOR 7.5 (GLOVE) ×2
GLOVE EXAM NITRILE LRG STRL (GLOVE) ×3 IMPLANT
GLOVE EXAM NITRILE MD LF STRL (GLOVE) IMPLANT
GLOVE EXAM NITRILE XL STR (GLOVE) IMPLANT
GLOVE EXAM NITRILE XS STR PU (GLOVE) IMPLANT
GLOVE INDICATOR 8.5 STRL (GLOVE) ×3 IMPLANT
GLOVE SS BIOGEL STRL SZ 7 (GLOVE) ×2 IMPLANT
GLOVE SUPERSENSE BIOGEL SZ 7 (GLOVE) ×4
GOWN STRL REUS W/ TWL LRG LVL3 (GOWN DISPOSABLE) ×4 IMPLANT
GOWN STRL REUS W/ TWL XL LVL3 (GOWN DISPOSABLE) ×1 IMPLANT
GOWN STRL REUS W/TWL 2XL LVL3 (GOWN DISPOSABLE) IMPLANT
GOWN STRL REUS W/TWL LRG LVL3 (GOWN DISPOSABLE) ×8
GOWN STRL REUS W/TWL XL LVL3 (GOWN DISPOSABLE) ×2
HEMOSTAT POWDER KIT SURGIFOAM (HEMOSTASIS) ×6 IMPLANT
HEMOSTAT SURGICEL 2X14 (HEMOSTASIS) IMPLANT
KIT BASIN OR (CUSTOM PROCEDURE TRAY) ×3 IMPLANT
KIT ROOM TURNOVER OR (KITS) ×3 IMPLANT
MARKER SKIN DUAL TIP RULER LAB (MISCELLANEOUS) ×3 IMPLANT
NEEDLE HYPO 21X1.5 SAFETY (NEEDLE) ×3 IMPLANT
NEEDLE HYPO 25X1 1.5 SAFETY (NEEDLE) ×3 IMPLANT
NS IRRIG 1000ML POUR BTL (IV SOLUTION) ×6 IMPLANT
PACK CRANIOTOMY (CUSTOM PROCEDURE TRAY) ×3 IMPLANT
PATTIES SURGICAL .5 X.5 (GAUZE/BANDAGES/DRESSINGS) IMPLANT
PATTIES SURGICAL .5 X3 (DISPOSABLE) ×6 IMPLANT
PATTIES SURGICAL 1X1 (DISPOSABLE) ×3 IMPLANT
PIN MAYFIELD SKULL DISP (PIN) ×3 IMPLANT
PLATE 1.5/0.5 18.5MM BURR HOLE (Plate) ×9 IMPLANT
SCREW SELF DRILL HT 1.5/4MM (Screw) ×27 IMPLANT
SPONGE NEURO XRAY DETECT 1X3 (DISPOSABLE) IMPLANT
SPONGE SURGIFOAM ABS GEL 100 (HEMOSTASIS) ×3 IMPLANT
SPONGE SURGIFOAM ABS GEL 100C (HEMOSTASIS) ×3 IMPLANT
STAPLER VISISTAT 35W (STAPLE) ×6 IMPLANT
STOCKINETTE 6  STRL (DRAPES) ×2
STOCKINETTE 6 STRL (DRAPES) ×1 IMPLANT
STRIP SURGICAL 1 X 6 IN (GAUZE/BANDAGES/DRESSINGS) ×3 IMPLANT
STRIP SURGICAL 1/4 X 6 IN (GAUZE/BANDAGES/DRESSINGS) ×3 IMPLANT
SUT ETHILON 3 0 FSL (SUTURE) IMPLANT
SUT ETHILON 3 0 PS 1 (SUTURE) IMPLANT
SUT NURALON 4 0 TR CR/8 (SUTURE) ×12 IMPLANT
SUT VIC AB 0 CT1 18XCR BRD8 (SUTURE) IMPLANT
SUT VIC AB 0 CT1 8-18 (SUTURE)
SUT VIC AB 2-0 CT1 18 (SUTURE) ×6 IMPLANT
SYR 30ML LL (SYRINGE) ×6 IMPLANT
TOWEL OR 17X24 6PK STRL BLUE (TOWEL DISPOSABLE) ×3 IMPLANT
TOWEL OR 17X26 10 PK STRL BLUE (TOWEL DISPOSABLE) ×3 IMPLANT
TRAY FOLEY W/METER SILVER 16FR (SET/KITS/TRAYS/PACK) ×3 IMPLANT
TUBE CONNECTING 12'X1/4 (SUCTIONS) ×1
TUBE CONNECTING 12X1/4 (SUCTIONS) ×2 IMPLANT
UNDERPAD 30X30 INCONTINENT (UNDERPADS AND DIAPERS) ×3 IMPLANT
WATER STERILE IRR 1000ML POUR (IV SOLUTION) ×3 IMPLANT

## 2015-10-16 NOTE — Anesthesia Preprocedure Evaluation (Addendum)
Anesthesia Evaluation  Patient identified by MRN, date of birth, ID band Patient awake    Reviewed: Allergy & Precautions, H&P , NPO status , Patient's Chart, lab work & pertinent test results  History of Anesthesia Complications Negative for: history of anesthetic complications  Airway Mallampati: II  TM Distance: >3 FB Neck ROM: full    Dental no notable dental hx.    Pulmonary neg pulmonary ROS,    Pulmonary exam normal breath sounds clear to auscultation       Cardiovascular negative cardio ROS Normal cardiovascular exam Rhythm:regular Rate:Normal     Neuro/Psych negative neurological ROS     GI/Hepatic negative GI ROS, Neg liver ROS,   Endo/Other  Hypothyroidism   Renal/GU negative Renal ROS     Musculoskeletal  (+) Arthritis ,   Abdominal   Peds  Hematology negative hematology ROS (+)   Anesthesia Other Findings Chronic back pain, breast cancer, intracranial meningiomas  Will need to be maintained on decadron and keppra  Reproductive/Obstetrics negative OB ROS                            Anesthesia Physical Anesthesia Plan  ASA: III  Anesthesia Plan: General   Post-op Pain Management:    Induction: Intravenous  Airway Management Planned: Oral ETT  Additional Equipment: Arterial line  Intra-op Plan:   Post-operative Plan:   Informed Consent: I have reviewed the patients History and Physical, chart, labs and discussed the procedure including the risks, benefits and alternatives for the proposed anesthesia with the patient or authorized representative who has indicated his/her understanding and acceptance.   Dental Advisory Given  Plan Discussed with: Anesthesiologist, CRNA and Surgeon  Anesthesia Plan Comments:         Anesthesia Quick Evaluation

## 2015-10-16 NOTE — Progress Notes (Signed)
No acute events No new questions AVSS Neuro intact Labs good Ready for OR

## 2015-10-16 NOTE — Care Management Important Message (Signed)
Important Message  Patient Details  Name: Julia Poole MRN: [id number] Date of Birth: 1955-12-18   Medicare Important Message Given:  Yes    [name] 10/16/2015, 3:01 PM

## 2015-10-16 NOTE — Progress Notes (Signed)
PROGRESS NOTE  Julia Poole  [id number] DOB: May 27, 1955  DOA: 10/13/2015 PCP: Chevis Pretty, FNP   Brief Narrative:  60 year old female, married, lives with spouse, independent of activities of daily living, PMH of hypothyroid, HLD, recently sought treatment for URI with PCP and then a week ago noted intermittent mild frontal headaches, memory impairment, confusion and gait imbalance. As per nephew, noted that she would freeze while doing something in the kitchen, looked blankly for a few seconds then done around to ask what happened. She started using her spouse's cane. In the ED, CT and MRI brain revealed meningiomas with vasogenic edema around the largest one. Neurosurgery has consulted and planned surgery on 10/16/15.   Assessment & Plan:   Active Problems:   Brain mass   Meningiomas - Patient likely symptomatic from the largest one including possible seizure-like activity. - Neurosurgery consultation appreciated: Started on IV Decadron, Keppra and plan for surgery on 7/17 to remove large left parietal tumor and at the same time the smaller parafalcine tumor. Rest to be followed with surveillance imaging. - Discussed with Dr. Cyndy Freeze. Awaiting surgery today.  Hypothyroid - Continue Synthroid. Clinically appears euthyroid. TSH normal.  Hyperlipidemia - Continue statins and omega-3    DVT prophylaxis: Lovenox Code Status: Full Family Communication: Discussed with patient's son at bedside Disposition Plan: DC possibly to SNF postop   Consultants:   Neurosurgery  Procedures:   None  Antimicrobials:   None    Subjective: Seen this morning prior to surgery. No new complaints reported. States that she had all her questions pertaining to surgery answered by the surgeon.  Objective:  Filed Vitals:   10/16/15 0148 10/16/15 0541 10/16/15 0941 10/16/15 1333  BP: 105/58 107/64 117/71 113/72  Pulse: 67 59 64 58  Temp: 98.3 F (36.8 C) 97.6 F (36.4 C) 97.7 F  (36.5 C) 98.4 F (36.9 C)  TempSrc: Oral Oral Oral Oral  Resp: 16 16 18 18   Height:      Weight:      SpO2: 98% 97% 97% 96%   No intake or output data in the 24 hours ending 10/16/15 1622 Filed Weights   10/13/15 1946  Weight: 85.4 kg (188 lb 4.4 oz)    Examination:  General exam: Pleasant middle-aged female lying comfortably in bed.Patient's son at bedside. Respiratory system: Clear to auscultation. Respiratory effort normal. Cardiovascular system: S1 & S2 heard, RRR. No JVD, murmurs, rubs, gallops or clicks. No pedal edema.  Gastrointestinal system: Abdomen is nondistended, soft and nontender. No organomegaly or masses felt. Normal bowel sounds heard. Central nervous system: Alert and oriented. No focal neurological deficits. Extremities: Symmetric 5 x 5 power. Skin: No rashes, lesions or ulcers Psychiatry: Judgement and insight appear normal. Mood & affect appropriate.     Data Reviewed: I have personally reviewed following labs and imaging studies  CBC:  Recent Labs Lab 10/13/15 1520 10/13/15 1547 10/13/15 1953 10/14/15 0632  WBC 8.0  --  7.6 9.4  NEUTROABS 4.9  --   --   --   HGB 13.6 13.6 13.6 13.9  HCT 40.7 40.0 40.6 42.3  MCV 91.9  --  92.3 91.8  PLT 351  --  349 [id number]   Basic Metabolic Panel:  Recent Labs Lab 10/13/15 1520 10/13/15 1547 10/13/15 1953 10/14/15 0632  NA 139 141  --  140  K 3.9 4.0  --  4.4  CL 105 103  --  106  CO2 27  --   --  23  GLUCOSE 136* 134*  --  155*  BUN 14 15  --  16  CREATININE 0.60 0.60 0.63 0.60  CALCIUM 9.8  --   --  9.9  MG  --   --  2.2  --   PHOS  --   --  3.4  --    GFR: Estimated Creatinine Clearance: 79.7 mL/min (by C-G formula based on Cr of 0.6). Liver Function Tests:  Recent Labs Lab 10/13/15 1520 10/14/15 0632  AST 32 28  ALT 30 28  ALKPHOS 107 101  BILITOT 0.8 0.6  PROT 7.1 6.8  ALBUMIN 3.8 3.3*   No results for input(s): LIPASE, AMYLASE in the last 168 hours. No results for input(s):  AMMONIA in the last 168 hours. Coagulation Profile:  Recent Labs Lab 10/13/15 1520 10/15/15 1127  INR 1.03 1.11   Cardiac Enzymes: No results for input(s): CKTOTAL, CKMB, CKMBINDEX, TROPONINI in the last 168 hours. BNP (last 3 results) No results for input(s): PROBNP in the last 8760 hours. HbA1C: No results for input(s): HGBA1C in the last 72 hours. CBG:  Recent Labs Lab 10/13/15 1655 10/15/15 2207 10/17/2015 0611 10/17/15 1124  GLUCAP 100* 203* 137* 129*   Lipid Profile: No results for input(s): CHOL, HDL, LDLCALC, TRIG, CHOLHDL, LDLDIRECT in the last 72 hours. Thyroid Function Tests:  Recent Labs  10/13/15 2002  TSH 2.055   Anemia Panel: No results for input(s): VITAMINB12, FOLATE, FERRITIN, TIBC, IRON, RETICCTPCT in the last 72 hours.  Sepsis Labs: No results for input(s): PROCALCITON, LATICACIDVEN in the last 168 hours.  No results found for this or any previous visit (from the past 240 hour(s)).       Radiology Studies: Mr Kizzie Fantasia Contrast  10-17-2015  CLINICAL DATA:  60 y/o  F; brain mass or section. EXAM: MRI HEAD WITHOUT AND WITH CONTRAST TECHNIQUE: Multiplanar, multiecho pulse sequences of the brain and surrounding structures were obtained without and with intravenous contrast. Brain lab sequences included. CONTRAST:  82mL MULTIHANCE GADOBENATE DIMEGLUMINE 529 MG/ML IV SOLN COMPARISON:  MRI of the brain dated 10/14/2015. CT head dated 10/13/2015. FINDINGS: Brain: No diffusion restriction to suggest acute infarct. No new abnormal susceptibility hypointensity to indicate new intracranial hemorrhage. Stable extra-axial masses with avid enhancement, areas of diffusion restriction, susceptibility hypointensity corresponding to calcifications on CT, and necrotic areas within the left parietal lesion, as follows: 1. Left paramedian parietal along the falx with mass effect on the overlying brain which demonstrates a stable region of surrounding vasogenic edema and  local mass effect with sulcal effacement and partial effacement of the left lateral ventricle, series 11: Image 115. 31 x 37 mm axially. 2. Right posterior frontal paramedian falx, 11:132. 3. Right frontal operculum region, 11:105. 4. Left lateral parietal region, 11:110. Extra-axial space: No hydrocephalus. Partial effacement of the posterior frontal, occipital, and posterior temporal horns of the left lateral ventricle due to mass effect from vasogenic edema associated with the left parietal extra-axial mass. Proximal intracranial flow voids are maintained. No abnormality of the cervical medullary junction. Other: No abnormal signal of the paranasal sinuses. No abnormal signal of the mastoid air cells. Orbits are unremarkable. Calvarium is unremarkable. IMPRESSION: Stable extra-axial masses with enhancement and calcifications from 10/14/2015. In the absence of metastatic disease these are consistent with multiple meningioma. Stable vasogenic edema surrounding the largest left parietal mass. No new lesion is identified. Electronically Signed   By: Kristine Garbe M.D.   On: 2015/10/17 09:29  Scheduled Meds: .  ceFAZolin (ANCEF) IV  2 g Intravenous To SS-Surg  . dexamethasone  4 mg Intravenous Q6H  . levETIRAcetam  1,000 mg Oral BID  . levothyroxine  50 mcg Oral QAC breakfast  . mannitol  100 g Intravenous Once  . omega-3 acid ethyl esters  1 g Oral Daily  . remifentanil (ULTIVA) 2 mg in 100 mL normal saline (20 mcg/mL) Optime  0.0125 mcg/kg/min Intravenous To NeurOR  . rosuvastatin  10 mg Oral Daily  . vitamin B-12  100 mcg Oral Daily   Continuous Infusions:    LOS: 3 days    Time spent: 30 minutes.    [facility] MD [facility] Pager [phone]  If 7PM-7AM, please contact night-coverage [email or web address] Password [facility] 10/16/2015, 4:22 PM

## 2015-10-16 NOTE — Brief Op Note (Signed)
10/13/2015 - 10/16/2015  9:06 PM  PATIENT:  Julia Poole  60 y.o. female  PRE-OPERATIVE DIAGNOSIS:  tumor  POST-OPERATIVE DIAGNOSIS:  tumor  PROCEDURE:  Procedure(s): PARA-SAGITTAL CRANIOTOMY WITH STEREOTACTIC NAVIGATION FOR TUMOR (Left)  SURGEON:  Surgeon(s) and Role:    * Tamala Fothergill, MD - Primary    * Kary Kos, MD - Assisting  PHYSICIAN ASSISTANT:   ASSISTANTS: Kary Kos, MD.  ANESTHESIA:   General  EBL:  Total I/O In: 1500 [I.V.:1000; IV Piggyback:500] Out: [id number] [Urine:1330; Blood:300]  BLOOD ADMINISTERED:none  DRAINS: JP  LOCAL MEDICATIONS USED:  MARCAINE    and LIDOCAINE   SPECIMEN:  Excision  DISPOSITION OF SPECIMEN:  PATHOLOGY  COUNTS:  YES  TOURNIQUET:  * No tourniquets in log *  DICTATION: .Dragon Dictation  PLAN OF CARE: Admit to inpatient   PATIENT DISPOSITION:  ICU - extubated and stable.   Delay start of Pharmacological VTE agent (>24hrs) due to surgical blood loss or risk of bleeding: yes

## 2015-10-16 NOTE — Transfer of Care (Signed)
Immediate Anesthesia Transfer of Care Note  Patient: Julia Poole  Procedure(s) Performed: Procedure(s): PARA-SAGITTAL CRANIOTOMY WITH STEREOTACTIC NAVIGATION FOR TUMOR (Left)  Patient Location: PACU  Anesthesia Type:General  Level of Consciousness: awake, alert , oriented and patient cooperative  Airway & Oxygen Therapy: Patient Spontanous Breathing and Patient connected to nasal cannula oxygen  Post-op Assessment: Report given to RN, Post -op Vital signs reviewed and stable and Patient moving all extremities X 4  Post vital signs: Reviewed and stable  Last Vitals:  Filed Vitals:   10/16/15 0941 10/16/15 1333  BP: 117/71 113/72  Pulse: 64 58  Temp: 36.5 C 36.9 C  Resp: 18 18    Last Pain:  Filed Vitals:   10/16/15 1334  PainSc: 0-No pain         Complications: No apparent anesthesia complications

## 2015-10-16 NOTE — Anesthesia Procedure Notes (Signed)
Procedure Name: Intubation Date/Time: 10/16/2015 5:16 PM Performed by: Bethel Born Pre-anesthesia Checklist: Patient identified, Suction available, Patient being monitored and Emergency Drugs available Patient Re-evaluated:Patient Re-evaluated prior to inductionOxygen Delivery Method: Circle system utilized Preoxygenation: Pre-oxygenation with 100% oxygen Intubation Type: IV induction Ventilation: Mask ventilation without difficulty and Oral airway inserted - appropriate to patient size Laryngoscope Size: Sabra Heck and 2 Grade View: Grade II Tube type: Oral Tube size: 7.0 mm Number of attempts: 1 Airway Equipment and Method: Stylet Placement Confirmation: ETT inserted through vocal cords under direct vision,  positive ETCO2 and breath sounds checked- equal and bilateral Secured at: 23 cm Tube secured with: Tape Dental Injury: Teeth and Oropharynx as per pre-operative assessment

## 2015-10-17 ENCOUNTER — Encounter (HOSPITAL_COMMUNITY): Payer: Self-pay | Admitting: Neurological Surgery

## 2015-10-17 ENCOUNTER — Inpatient Hospital Stay (HOSPITAL_COMMUNITY): Payer: Medicare Other

## 2015-10-17 DIAGNOSIS — Z9889 Other specified postprocedural states: Secondary | ICD-10-CM

## 2015-10-17 LAB — BASIC METABOLIC PANEL
Anion gap: 8 (ref 5–15)
BUN: 23 mg/dL — AB (ref 6–20)
CALCIUM: 8.4 mg/dL — AB (ref 8.9–10.3)
CO2: 22 mmol/L (ref 22–32)
Chloride: 110 mmol/L (ref 101–111)
Creatinine, Ser: 0.8 mg/dL (ref 0.44–1.00)
GFR calc Af Amer: >60 mL/min (ref >60)
GLUCOSE: 168 mg/dL — AB (ref 65–99)
Potassium: 4.1 mmol/L (ref 3.5–5.1)
Sodium: 140 mmol/L (ref 135–145)

## 2015-10-17 LAB — CBC
HCT: 29.5 % — ABNORMAL LOW (ref 36.0–46.0)
Hemoglobin: 9.7 g/dL — ABNORMAL LOW (ref 12.0–15.0)
MCH: 30.5 pg (ref 26.0–34.0)
MCHC: 32.9 g/dL (ref 30.0–36.0)
MCV: 92.8 fL (ref 78.0–100.0)
Platelets: 307 10*3/uL (ref 150–400)
RBC: 3.18 MIL/uL — ABNORMAL LOW (ref 3.87–5.11)
RDW: 12.9 % (ref 11.5–15.5)
WBC: 20.9 10*3/uL — ABNORMAL HIGH (ref 4.0–10.5)

## 2015-10-17 LAB — MRSA PCR SCREENING: MRSA BY PCR: NEGATIVE

## 2015-10-17 MED ORDER — DEXAMETHASONE 4 MG PO TABS
4.0000 mg | ORAL_TABLET | Freq: Two times a day (BID) | ORAL | Status: DC
Start: 2015-10-17 — End: 2015-10-18
  Administered 2015-10-17 – 2015-10-18 (×2): 4 mg via ORAL
  Filled 2015-10-17 (×3): qty 1

## 2015-10-17 MED ORDER — GADOBENATE DIMEGLUMINE 529 MG/ML IV SOLN
20.0000 mL | Freq: Once | INTRAVENOUS | Status: AC | PRN
  Administered 2015-10-17: 20 mL via INTRAVENOUS

## 2015-10-17 NOTE — Op Note (Signed)
10/13/2015 - 10/16/2015  12:56 PM  PATIENT:  Julia Poole  60 y.o. female  PRE-OPERATIVE DIAGNOSIS:  Multiple meningiomas, including one large parafalcine tumor projecting to the left with substantial vasogenic edema and symptoms due to mass effect  POST-OPERATIVE DIAGNOSIS:  Same  PROCEDURE:  Left parasagittal craniotomy for resection of meningioma, supratentorial; use of stereotactic navigation  SURGEON:  Aldean Ast, MD  ASSISTANTS: Kary Kos, MD  ANESTHESIA:   General  DRAINS: JP   SPECIMEN:  Brain tumor  INDICATION FOR PROCEDURE: 60 year old female with symptomatic meningioma. Patient understood the risks, benefits, and alternatives and potential outcomes and wished to proceed.  PROCEDURE DETAILS: After smooth induction of general endotracheal anesthesia  Mayfield pins were applied and the patient was turned prone on the operating table. BrainLab navigation was registered. Hair was clipped from the planned operative site. The skin was wiped down with alcohol. Lidocaine and Marcaine with epinephrine was injected in the subcutaneous tissues over the area of the planned incision. The patient was prepped and draped in usual sterile fashion.  A curvilinear incision was made in the parietal area which crossed over the sagittal suture. The scalp was reflected laterally. Using the [place] navigation platform a craniotomy was planned which would allow access to both full seen tumors. Bur holes were drilled on each side of the sagittal sinus. A fifth burr hole was made laterally to the left. Dura was separated and a craniotomy was performed. The dura was opened and was reflected medially over the sagittal sinus. I dissected into the interhemispheric fissure, with care taken to preserve the bridging veins in route to the sinus, until I was at the level of the larger, posterior tumor. I carefully worked in the arachnoid plane around the tumor and separated it from the brain.  Intermittently I would debulk the central portion of the tumor. This allowed additional room to continue the subarachnoid dissection. The tumor was then removed in a piecemeal fashion until it was freed up to the point that a large tumor mass was removed. The falx was then resected to the degree that it was apparently involved.  Hemostasis was obtained.  I then turned my attention to the anterior parasagittal tumor. Unfortunately because of a large number of immobile bridging veins I was unable to safely perform further interhemispheric dissection anteriorly. Because this tumor was small enough for radiosurgery I chose to not proceed with resection of this tumor.  I again confirmed that there was excellent hemostasis within the tumor cavity. I irrigated vigorously with bacitracin saline. The dura was closed with interrupted Nurolon sutures I obtained hemostasis in the epidural space with a mixture of Surgi-Flo, Surgicel, and Avitene.   The craniotomy flap was reinserted and secured with titanium fixation plates. I tunneled a Jackson-Pratt drain posteriorly through the skin. This was secured with a pursestring Vicryl suture.   The craniotomy flap was replaced and secured with titanium fixation plates. I irrigated again. The galea was closed with 2-0 Vicryl sutures. The skin was closed staples.  PATIENT DISPOSITION:  ICU - extubated and stable.   Delay start of Pharmacological VTE agent (>24hrs) due to surgical blood loss or risk of bleeding:  yes

## 2015-10-17 NOTE — Evaluation (Signed)
Physical Therapy Evaluation Patient Details Name: Julia Poole MRN: [id number] DOB: 24-May-1955 Today's Date: 10/17/2015   History of Present Illness  60 yo s/p Left parasagittal craniotomy for resection of meningioma, supratentorial; use of stereotactic navigation  Clinical Impression  Patient demonstrates deficits in functional mobility as indicated below. Will need continued skilled PT to address deficits and maixmize function. Will see as indicated and progress as tolerated. Patient with some balance deficits in conjunction with some cognitive impairments, recommend HHPT upon acute discharge.     Follow Up Recommendations Home health PT;Supervision/Assistance - 24 hour    Equipment Recommendations  None recommended by PT    Recommendations for Other Services       Precautions / Restrictions Precautions Precautions: Fall      Mobility  Bed Mobility               General bed mobility comments: OOB in chair  Transfers Overall transfer level: Needs assistance Equipment used: 2 person hand held assist Transfers: Sit to/from Stand Sit to Stand: Min assist;+2 physical assistance         General transfer comment: initially required greater assistance. Balance improved as pt comleted mobility  Ambulation/Gait Ambulation/Gait assistance: Min guard Ambulation Distance (Feet): 110 Feet Assistive device: Rolling walker (2 wheeled) Gait Pattern/deviations: Step-to pattern;Decreased stride length;Drifts right/left;Narrow base of support Gait velocity: decreased Gait velocity interpretation: Below normal speed for age/gender General Gait Details: initially patient with signifcantly decreased stride, multi modal cues to increase step length and cadence. Instability noted, but improved with increased cues and activity.  Stairs            Wheelchair Mobility    Modified Rankin (Stroke Patients Only)       Balance Overall balance assessment: Needs assistance    Sitting balance-Leahy Scale: Fair       Standing balance-Leahy Scale: Poor                               Pertinent Vitals/Pain Pain Assessment: Faces Faces Pain Scale: Hurts a little bit Pain Location: head Pain Descriptors / Indicators: Aching;Discomfort Pain Intervention(s): Limited activity within patient's tolerance    Home Living Family/patient expects to be discharged to:: Private residence Living Arrangements: Spouse/significant other;Children Available Help at Discharge: Family;Available 24 hours/day Type of Home: House Home Access: Stairs to enter Entrance Stairs-Rails: Right Entrance Stairs-Number of Steps: 2 Home Layout: Able to live on main level with bedroom/bathroom;Two level Home Equipment: Shower seat - built in;Cane - single point;Walker - 2 wheels      Prior Function Level of Independence: Independent;Independent with assistive device(s)         Comments: Pt states she was using a cane PTA due to confusion (?). States she was driving until @ 1 weelk ago     Hand Dominance   Dominant Hand: Right    Extremity/Trunk Assessment   Upper Extremity Assessment: Overall WFL for tasks assessed           Lower Extremity Assessment: Overall WFL for tasks assessed      Cervical / Trunk Assessment: Normal  Communication   Communication: Expressive difficulties (word finding difficulties at times. Difficulty with verbal f)  Cognition Arousal/Alertness: Awake/alert Behavior During Therapy: WFL for tasks assessed/performed Overall Cognitive Status: Impaired/Different from baseline Area of Impairment: Attention;Memory;Following commands;Safety/judgement;Awareness;Problem solving   Current Attention Level: Selective Memory: Decreased short-term memory Following Commands: Follows one step commands consistently (difficulty with multistep  commands) Safety/Judgement: Decreased awareness of safety;Decreased awareness of deficits Awareness:  Emergent Problem Solving: Slow processing;Difficulty sequencing;Requires verbal cues General Comments: Unable complete serial substraction; easily distracted; need to further assess    General Comments      Exercises        Assessment/Plan    PT Assessment Patient needs continued PT services  PT Diagnosis Difficulty walking;Abnormality of gait;Altered mental status   PT Problem List Decreased activity tolerance;Decreased balance;Decreased mobility;Decreased coordination;Decreased cognition  PT Treatment Interventions DME instruction;Gait training;Stair training;Functional mobility training;Therapeutic activities;Therapeutic exercise;Balance training;Patient/family education   PT Goals (Current goals can be found in the Care Plan section) Acute Rehab PT Goals Patient Stated Goal: to get better PT Goal Formulation: With patient Time For Goal Achievement: 10/31/15 Potential to Achieve Goals: Good    Frequency Min 4X/week   Barriers to discharge        Co-evaluation PT/OT/SLP Co-Evaluation/Treatment: Yes Reason for Co-Treatment: Complexity of the patient's impairments (multi-system involvement);For patient/therapist safety PT goals addressed during session: Mobility/safety with mobility OT goals addressed during session: ADL's and self-care       End of Session Equipment Utilized During Treatment: Gait belt Activity Tolerance: Patient tolerated treatment well Patient left: in chair;with call bell/phone within reach Nurse Communication: Mobility status         Time: 1635-1700 PT Time Calculation (min) (ACUTE ONLY): 25 min   Charges:   PT Evaluation $PT Eval Moderate Complexity: 1 Procedure     PT G CodesDuncan Dull 11-04-15, 5:48 PM Alben Deeds, [place] DPT  [phone]

## 2015-10-17 NOTE — Progress Notes (Signed)
Occupational Therapy Evaluation Patient Details Name: Julia Poole MRN: [id number] DOB: 1955-11-10 Today's Date: 10/17/2015    History of Present Illness 60 yo s/p Left parasagittal craniotomy for resection of meningioma, supratentorial; use of stereotactic navigation   Clinical Impression   PTA, pt states she was independent with ADL and mod I with mobility with use of cane due to "confusion". Pt currently requires min A with mobility and ADL @ RW level. Pt demonstrates difficulty with higher level executive level skills in addition to R/L discrimination and following multistep commands. Do not feel pt will be ready for D/C tomorrow. Recommend pt follow up with [place] and 24/7 S at D/C.     Follow Up Recommendations  Home health OT;Supervision/Assistance - 24 hour    Equipment Recommendations  None recommended by OT    Recommendations for Other Services Speech consult (cognitive-linguistic eval)     Precautions / Restrictions Precautions Precautions: Fall      Mobility Bed Mobility               General bed mobility comments: OOB in chair  Transfers Overall transfer level: Needs assistance Equipment used: 2 person hand held assist Transfers: Sit to/from Stand Sit to Stand: Min assist;+2 physical assistance         General transfer comment: initially requried greater assistance. [place] improved as pt comleted mobility    Balance Overall balance assessment: Needs assistance   Sitting balance-Leahy Scale: Fair       Standing balance-Leahy Scale: Poor                              ADL Overall ADL's : Needs assistance/impaired Eating/Feeding: Modified independent   Grooming: Set up;Sitting   Upper Body Bathing: Set up;Sitting   Lower Body Bathing: Minimal assistance;Sit to/from stand   Upper Body Dressing : Set up;Sitting   Lower Body Dressing: Minimal assistance;Sit to/from stand   Toilet Transfer: Minimal assistance;Ambulation;RW  (simulated)   Toileting- Clothing Manipulation and Hygiene: Supervision/safety       Functional mobility during ADLs: Rolling walker;Minimal assistance General ADL Comments: initially min A with ambulation due to unsteadiness with slow shuffling gait pattern. gait speed improved with vc.     Vision Additional Comments: will further assess   Perception     Praxis Praxis Praxis-Other Comments: difficulty with multistep command. R/L discrimination difficulties    Pertinent Vitals/Pain Pain Assessment: Faces Faces Pain Scale: Hurts a little bit Pain Location: head Pain Descriptors / Indicators: Aching;Discomfort Pain Intervention(s): Limited activity within patient's tolerance     Hand Dominance Right   Extremity/Trunk Assessment Upper Extremity Assessment Upper Extremity Assessment: Overall WFL for tasks assessed   Lower Extremity Assessment Lower Extremity Assessment: Defer to PT evaluation   Cervical / Trunk Assessment Cervical / Trunk Assessment: Normal   Communication Communication Communication: Expressive difficulties (word finding difficulties at times. Difficulty with verbal f)   Cognition Arousal/Alertness: Awake/alert Behavior During Therapy: WFL for tasks assessed/performed Overall Cognitive Status: Impaired/Different from baseline Area of Impairment: Attention;Memory;Following commands;Safety/judgement;Awareness;Problem solving   Current Attention Level: Selective Memory: Decreased short-term memory Following Commands: Follows one step commands consistently (difficulty with multistep commands) Safety/Judgement: Decreased awareness of safety;Decreased awareness of deficits Awareness: Emergent Problem Solving: Slow processing;Difficulty sequencing;Requires verbal cues General Comments: Unable complete serial substraction; easily distracted; need to further assess   General Comments       Exercises       Shoulder Instructions  Home Living  Family/patient expects to be discharged to:: Private residence Living Arrangements: Spouse/significant other;Children Available Help at Discharge: Family;Available 24 hours/day Type of Home: House Home Access: Stairs to enter [employer] of Steps: 2 Entrance Stairs-Rails: Right Home Layout: Able to live on main level with bedroom/bathroom;Two level     Bathroom Shower/Tub: Occupational psychologist: Standard Bathroom Accessibility: Yes How Accessible: Accessible via walker Home Equipment: Shower seat - built in;Cane - single point;Walker - 2 wheels          Prior Functioning/Environment Level of Independence: Independent;Independent with assistive device(s)        Comments: Pt states she was using a cane PTA due to confusion (?). States she was driving until @ 1 weelk ago    OT Diagnosis: Generalized weakness;Cognitive deficits;Acute pain   OT Problem List: Decreased strength;Decreased activity tolerance;Impaired balance (sitting and/or standing);Decreased cognition;Decreased safety awareness;Decreased knowledge of use of DME or AE;Obesity;Pain   OT Treatment/Interventions: Self-care/ADL training;DME and/or AE instruction;Therapeutic activities;Cognitive remediation/compensation;Patient/family education;Balance training    OT Goals(Current goals can be found in the care plan section) Acute Rehab OT Goals Patient Stated Goal: to get better OT Goal Formulation: With patient Time For Goal Achievement: 10/31/15 Potential to Achieve Goals: Good ADL Goals Pt Will Perform Lower Body Bathing: with supervision;with caregiver independent in assisting;sit to/from stand Pt Will Perform Lower Body Dressing: with supervision;with caregiver independent in assisting;sit to/from stand Pt Will Transfer to Toilet: with supervision;ambulating;regular height toilet Pt Will Perform Toileting - Clothing Manipulation and hygiene: with modified independence;sitting/lateral  leans Additional ADL Goal #1: Pt will complete 3 step functional task in moderately distracting environment with minimal redirectional cues.  OT Frequency: Min 2X/week   Barriers to D/C:            Co-evaluation PT/OT/SLP Co-Evaluation/Treatment: Yes Reason for Co-Treatment: Complexity of the patient's impairments (multi-system involvement);For patient/therapist safety   OT goals addressed during session: ADL's and self-care      End of Session Equipment Utilized During Treatment: Gait belt;Rolling walker Nurse Communication: Mobility status;Other (comment) (D/C needs)  Activity Tolerance: Patient tolerated treatment well Patient left: in chair;with call bell/phone within reach   Time: 1635-1700 OT Time Calculation (min): 25 min Charges:  OT General Charges $OT Visit: 1 Procedure OT Evaluation $OT Eval Moderate Complexity: 1 Procedure G-Codes:    Riely Oetken,HILLARY Oct 25, 2015, 5:25 PM   [facility], OTR/L  [phone] 10-25-2015

## 2015-10-17 NOTE — Care Management Note (Signed)
Case Management Note  Patient Details  Name: Julia Poole MRN: [id number] Date of Birth: [birth year]  Subjective/Objective:   Pt admitted on 10/13/15 with confusion; found to have brain mass.  S/p craniotomy on 10/17/15.  PTA, pt independent, lives with spouse.          Action/Plan: Will follow for discharge planning as pt progresses.    Expected Discharge Date:                  Expected Discharge Plan:  Home/Self Care  In-House Referral:     Discharge planning Services  CM Consult  Post Acute Care Choice:    Choice offered to:     DME Arranged:    DME Agency:     HH Arranged:    HH Agency:     Status of Service:  In process, will continue to follow  If discussed at Long Length of Stay Meetings, dates discussed:    Additional Comments:  Reinaldo Raddle, RN, BSN  Trauma/Neuro ICU Case Manager [phone]

## 2015-10-17 NOTE — Progress Notes (Signed)
Pt arrived around 2000 alert oriented with some surgical pain which was addressed with pain medication, husband is with her and she is resting comfortable.

## 2015-10-17 NOTE — Progress Notes (Signed)
No acute events AVSS Awake and alert Moving all extremities well, no leg numbness Bandage c/d/i Obtain MRI then d/c drain and likely TTF

## 2015-10-17 NOTE — Progress Notes (Addendum)
PROGRESS NOTE  Julia Poole  [id number] DOB: 26-Feb-1956  DOA: 10/13/2015 PCP: Chevis Pretty, FNP   Brief Narrative:  60 year old female, married, lives with spouse, independent of activities of daily living, PMH of hypothyroid, HLD, recently sought treatment for URI with PCP and then a week ago noted intermittent mild frontal headaches, memory impairment, confusion and gait imbalance. As per nephew, noted that she would freeze while doing something in the kitchen, looked blankly for a few seconds then done around to ask what happened. She started using her spouse's cane. In the ED, CT and MRI brain revealed meningiomas with vasogenic edema around the largest one. Neurosurgery consulted and patient underwent craniotomy for resection of meningioma on 7/17. Postop patient was transferred to ICU. On 7/18, discussed with Dr. Cyndy Freeze who accepted all care on to his service and [place] signed off. Please call [facility] for any further assistance.   Assessment & Plan:   Active Problems:   Brain mass   Meningiomas - Patient likely symptomatic from the largest one including possible seizure-like activity. - Neurosurgery consultation appreciated: Started on IV Decadron, Keppra and plan for surgery on 7/17 to remove large left parietal tumor and at the same time the smaller parafalcine tumor. Rest to be followed with surveillance imaging. - Status post left parasagittal craniotomy for resection of meningioma, supratentorial, use a stereotactic navigation on 7/17. Postop patient was transferred to ICU. Management per neurosurgery.  Hypothyroid - Continue Synthroid. Clinically appears euthyroid. TSH normal.  Hyperlipidemia - Continue statins and omega-3  Postop Acute blood loss anemia -Hemoglobin has dropped from 13.9 preop >9.7. Follow CBC and transfuse PRBC for hemoglobin less than 7 g per DL.   Leukocytosis - Secondary to steroids and stress response. Follow CBCs    DVT prophylaxis:SCDs  Code  Status: Full Family Communication: None at bedside  Disposition Plan: per Neurosurgery   Consultants:   Neurosurgery  Procedures:   Left parasagittal craniotomy for resection of meningioma, supratentorial; use of stereotactic navigation  Antimicrobials:   None    Subjective: Seen in ICU. Feels sore at surgical site but denies any other complaints. As per RN, no acute issues.  Objective:  Filed Vitals:   10/17/15 0800 10/17/15 0900 10/17/15 1000 10/17/15 1024  BP: 86/50 89/56 101/59   Pulse: 64 72 89 97  Temp: 98.1 F (36.7 C)     TempSrc: Oral     Resp: 10 17 17 19   Height:      Weight:      SpO2: 99% 99% 97% 100%    Intake/Output Summary (Last 24 hours) at 10/17/15 1443 Last data filed at 10/17/15 0930  Gross per 24 hour  Intake   4465 ml  Output   3325 ml  Net   1140 ml   Filed Weights   10/13/15 1946 10/16/15 2313  Weight: 85.4 kg (188 lb 4.4 oz) 88.7 kg (195 lb 8.8 oz)    Examination:  General exam: Pleasant middle-aged female lying comfortably in bed. Respiratory system: Clear to auscultation. Respiratory effort normal. Cardiovascular system: S1 & S2 heard, RRR. No JVD, murmurs, rubs, gallops or clicks. No pedal edema. Telemetry: Sinus rhythm.  Gastrointestinal system: Abdomen is nondistended, soft and nontender. No organomegaly or masses felt. Normal bowel sounds heard. Central nervous system: Alert and oriented. No focal neurological deficits.Craniotomy site appears clean and dry. Drain in place.  Extremities: Symmetric 5 x 5 power. Skin: No rashes, lesions or ulcers Psychiatry: Judgement and insight appear normal. Mood & affect appropriate.  Data Reviewed: I have personally reviewed following labs and imaging studies  CBC:  Recent Labs Lab 10/13/15 1520 10/13/15 1547 10/13/15 1953 10/14/15 0632 October 22, 2015 0352  WBC 8.0  --  7.6 9.4 20.9*  NEUTROABS 4.9  --   --   --   --   HGB 13.6 13.6 13.6 13.9 9.7*  HCT 40.7 40.0 40.6 42.3 29.5*    MCV 91.9  --  92.3 91.8 92.8  PLT 351  --  349 340 [id number]   Basic Metabolic Panel:  Recent Labs Lab 10/13/15 1520 10/13/15 1547 10/13/15 1953 10/14/15 0632 10-22-15 0352  NA 139 141  --  140 140  K 3.9 4.0  --  4.4 4.1  CL 105 103  --  106 110  CO2 27  --   --  23 22  GLUCOSE 136* 134*  --  155* 168*  BUN 14 15  --  16 23*  CREATININE 0.60 0.60 0.63 0.60 0.80  CALCIUM 9.8  --   --  9.9 8.4*  MG  --   --  2.2  --   --   PHOS  --   --  3.4  --   --    GFR: Estimated Creatinine Clearance: 81.3 mL/min (by C-G formula based on Cr of 0.8). Liver Function Tests:  Recent Labs Lab 10/13/15 1520 10/14/15 0632  AST 32 28  ALT 30 28  ALKPHOS 107 101  BILITOT 0.8 0.6  PROT 7.1 6.8  ALBUMIN 3.8 3.3*   No results for input(s): LIPASE, AMYLASE in the last 168 hours. No results for input(s): AMMONIA in the last 168 hours. Coagulation Profile:  Recent Labs Lab 10/13/15 1520 10/15/15 1127  INR 1.03 1.11   Cardiac Enzymes: No results for input(s): CKTOTAL, CKMB, CKMBINDEX, TROPONINI in the last 168 hours. BNP (last 3 results) No results for input(s): PROBNP in the last 8760 hours. HbA1C: No results for input(s): HGBA1C in the last 72 hours. CBG:  Recent Labs Lab 10/13/15 1655 10/15/15 2207 10/16/15 0611 10/16/15 1124  GLUCAP 100* 203* 137* 129*   Lipid Profile: No results for input(s): CHOL, HDL, LDLCALC, TRIG, CHOLHDL, LDLDIRECT in the last 72 hours. Thyroid Function Tests: No results for input(s): TSH, T4TOTAL, FREET4, T3FREE, THYROIDAB in the last 72 hours. Anemia Panel: No results for input(s): VITAMINB12, FOLATE, FERRITIN, TIBC, IRON, RETICCTPCT in the last 72 hours.  Sepsis Labs: No results for input(s): PROCALCITON, LATICACIDVEN in the last 168 hours.  Recent Results (from the past 240 hour(s))  MRSA PCR Screening     Status: None   Collection Time: 10/16/15 11:15 PM  Result Value Ref Range Status   MRSA by PCR NEGATIVE NEGATIVE Final    Comment:         The GeneXpert MRSA Assay (FDA approved for NASAL specimens only), is one component of a comprehensive MRSA colonization surveillance program. It is not intended to diagnose MRSA infection nor to guide or monitor treatment for MRSA infections.          Radiology Studies: Mr Kizzie Fantasia Contrast  10-22-2015  CLINICAL DATA:  Surgery for benign brain tumor yesterday. Follow-up. EXAM: MRI HEAD WITHOUT AND WITH CONTRAST TECHNIQUE: Multiplanar, multiecho pulse sequences of the brain and surrounding structures were obtained without and with intravenous contrast. CONTRAST:  71mL MULTIHANCE GADOBENATE DIMEGLUMINE 529 MG/ML IV SOLN COMPARISON:  10/16/2015 FINDINGS: Brainstem and cerebellum again appear normal. There has been previous posterior parietal craniotomy for resection of a left posterior falcine  meningioma. There is a small amount of blood products in the post resection space. There is no evidence of residual enhancing tumor in this location. Edema remains present within the posterior left brain. Three other meningiomas are unchanged. 7 mm meningioma at the right frontal convexity is unchanged. 15 mm meningioma projecting right word from the falx is unchanged. 7 mm meningioma along the left parietal convexity is unchanged. Major arterial and venous structures remain patent. No evidence of perioperative infarction. No extra-axial collection. IMPRESSION: Good appearance following what appears to be a complete resection of the left posterior falcine meningioma. Small amount of blood products at the site of resection as expected. Digital brain edema as expected. No evidence of perioperative infarction or extra-axial collection. Three other meningioma is have not changed, as described above. Electronically Signed   By: Nelson Chimes M.D.   On: 10/17/2015 13:11   Mr Jeri Cos [id number] Contrast  10/16/2015  CLINICAL DATA:  60 y/o  F; brain mass or section. EXAM: MRI HEAD WITHOUT AND WITH CONTRAST TECHNIQUE:  Multiplanar, multiecho pulse sequences of the brain and surrounding structures were obtained without and with intravenous contrast. Brain lab sequences included. CONTRAST:  46mL MULTIHANCE GADOBENATE DIMEGLUMINE 529 MG/ML IV SOLN COMPARISON:  MRI of the brain dated 10/14/2015. CT head dated 10/13/2015. FINDINGS: Brain: No diffusion restriction to suggest acute infarct. No new abnormal susceptibility hypointensity to indicate new intracranial hemorrhage. Stable extra-axial masses with avid enhancement, areas of diffusion restriction, susceptibility hypointensity corresponding to calcifications on CT, and necrotic areas within the left parietal lesion, as follows: 1. Left paramedian parietal along the falx with mass effect on the overlying brain which demonstrates a stable region of surrounding vasogenic edema and local mass effect with sulcal effacement and partial effacement of the left lateral ventricle, series 11: Image 115. 31 x 37 mm axially. 2. Right posterior frontal paramedian falx, 11:132. 3. Right frontal operculum region, 11:105. 4. Left lateral parietal region, 11:110. Extra-axial space: No hydrocephalus. Partial effacement of the posterior frontal, occipital, and posterior temporal horns of the left lateral ventricle due to mass effect from vasogenic edema associated with the left parietal extra-axial mass. Proximal intracranial flow voids are maintained. No abnormality of the cervical medullary junction. Other: No abnormal signal of the paranasal sinuses. No abnormal signal of the mastoid air cells. Orbits are unremarkable. Calvarium is unremarkable. IMPRESSION: Stable extra-axial masses with enhancement and calcifications from 10/14/2015. In the absence of metastatic disease these are consistent with multiple meningioma. Stable vasogenic edema surrounding the largest left parietal mass. No new lesion is identified. Electronically Signed   By: Kristine Garbe M.D.   On: 10/16/2015 09:29         Scheduled Meds: . dexamethasone  4 mg Intravenous Q6H  . docusate sodium  100 mg Oral BID  . levETIRAcetam  1,000 mg Oral BID  . levothyroxine  50 mcg Oral QAC breakfast  . omega-3 acid ethyl esters  1 g Oral Daily  . pantoprazole  20 mg Oral Daily  . remifentanil (ULTIVA) 2 mg in 100 mL normal saline (20 mcg/mL) Optime  0.0125 mcg/kg/min Intravenous STAT  . rosuvastatin  10 mg Oral Daily  . senna  1 tablet Oral BID  . vitamin B-12  100 mcg Oral Daily   Continuous Infusions: . sodium chloride 100 mL/hr at 10/17/15 0700     LOS: 4 days    Time spent: 30 minutes.    Vernell Leep, MD [facility] Pager [phone]  If 7PM-7AM, please  contact night-coverage [email or web address] Password [facility] 10/17/2015, 2:43 PM

## 2015-10-17 NOTE — Progress Notes (Signed)
Drain removed without problems

## 2015-10-17 NOTE — Anesthesia Postprocedure Evaluation (Signed)
Anesthesia Post Note  Patient: Julia Poole  Procedure(s) Performed: Procedure(s) (LRB): PARA-SAGITTAL CRANIOTOMY WITH STEREOTACTIC NAVIGATION FOR TUMOR (Left)  Patient location during evaluation: PACU Anesthesia Type: General Level of consciousness: sedated and patient cooperative Pain management: pain level controlled Vital Signs Assessment: post-procedure vital signs reviewed and stable Respiratory status: spontaneous breathing Cardiovascular status: stable Anesthetic complications: no    Last Vitals:  Filed Vitals:   10/17/15 0600 10/17/15 0700  BP: 96/55 90/52  Pulse: 70 67  Temp:    Resp: 13 14    Last Pain:  Filed Vitals:   10/17/15 0716  PainSc: [place]

## 2015-10-18 ENCOUNTER — Encounter (HOSPITAL_COMMUNITY): Payer: Self-pay | Admitting: General Practice

## 2015-10-18 MED ORDER — METHYLPREDNISOLONE 4 MG PO TABS: ORAL_TABLET | ORAL | Status: DC

## 2015-10-18 MED ORDER — LEVETIRACETAM 1000 MG PO TABS: 1000.0000 mg | ORAL_TABLET | Freq: Two times a day (BID) | ORAL | Status: DC

## 2015-10-18 MED ORDER — HYDROCODONE-ACETAMINOPHEN 5-325 MG PO TABS: 1.0000 | ORAL_TABLET | ORAL | Status: DC | PRN

## 2015-10-18 NOTE — Progress Notes (Signed)
No acute events Doing well Moving all extremities well D/c home today

## 2015-10-18 NOTE — Care Management Note (Addendum)
Case Management Note  Patient Details  Name: Julia Poole MRN: [id number] Date of Birth: October 20, 1955  Subjective/Objective:    Pt admitted on 10/13/15 with confusion; found to have brain mass. S/p craniotomy on 10/17/15. PTA, pt independent, lives with spouse. PT/OT recommending HH therapies. CM LM with Willie in Neuro OR for Dr. Cyndy Freeze to write orders for these if he would like for me to arrange. CM will continue to follow. Pt chooses [facility] to provide these services and CM notified Manuela Schwartz, Rep for [facility].                 Action/Plan:CM will continue to follow for orders.    Expected Discharge Date:                  Expected Discharge Plan:  Home/Self Care  In-House Referral:     Discharge planning Services  CM Consult  Post Acute Care Choice:    Choice offered to:  Patient  DME Arranged:    DME Agency:     HH Arranged:  PT, OT HH Agency:     Status of Service:  In process, will continue to follow  If discussed at Long Length of Stay Meetings, dates discussed:    Additional Comments:  Delrae Sawyers, RN 10/18/2015, 10:33 AM

## 2015-10-18 NOTE — Progress Notes (Signed)
20 Staples removed from gauze on her head.  Pt tolerated well.  Will continue to monitor.  Cori Razor, RN

## 2015-10-18 NOTE — Progress Notes (Signed)
Discharge orders received.  Discharge instructions and follow-up appointments reviewed with the patient.  VSS upon discharge.  IV removed and education complete.  All belongings sent with the patient.  Transported out via wheelchair. Takeya Marquis M, RN   

## 2015-10-18 NOTE — Discharge Summary (Signed)
Date of Admission: 10/13/2015  Date of Discharge: 10/18/2015  Admission Diagnosis: Multiple intracranial meningiomas  Discharge Diagnosis: Same  Procedure Performed: Left parasagittal craniotomy for meningioma resection; stereotactic navigation  Attending: Kevan Ny Capri Raben, MD  Hospital Course:  The patient was admitted for the above listed operation and had an uncomplicated post-operative course.  They were discharged in stable condition.  Follow up: 3 weeks    Medication List    TAKE these medications        acetaminophen-codeine 300-30 MG tablet  Commonly known as:  TYLENOL #3  TAKE ONE TABLET BY MOUTH EVERY FOUR HOURS AS NEEDED FOR PAIN     aspirin 81 MG tablet  Take 81 mg by mouth daily.     CALTRATE 600 PLUS-VIT D PO  Take 600 mg by mouth 2 (two) times daily.     CENTRUM SILVER PO  Take by mouth.     cyanocobalamin 100 MCG tablet  Take 100 mcg by mouth daily.     fish oil-omega-3 fatty acids 1000 MG capsule  Take 1 g by mouth daily.     GARLIC PO  Take [id number] mg by mouth daily.     HYDROcodone-acetaminophen 5-325 MG tablet  Commonly known as:  NORCO/VICODIN  Take 1 tablet by mouth every 4 (four) hours as needed for moderate pain.     levETIRAcetam 1000 MG tablet  Commonly known as:  KEPPRA  Take 1 tablet (1,000 mg total) by mouth 2 (two) times daily.     levothyroxine 50 MCG tablet  Commonly known as:  SYNTHROID, LEVOTHROID  Take 1 tablet (50 mcg total) by mouth daily.     methylPREDNISolone 4 MG tablet  Commonly known as:  MEDROL  Dispense 1 medrol dose pack, take as directed     oxaprozin 600 MG tablet  Commonly known as:  DAYPRO  Take 300 mg by mouth daily.     rosuvastatin 10 MG tablet  Commonly known as:  CRESTOR  TAKE ONE TABLET BY MOUTH ONE TIME DAILY     vitamin C 500 MG tablet  Commonly known as:  ASCORBIC ACID  Take 500 mg by mouth daily.     Vitamin D 2000 units tablet  Take 2,000 Units by mouth 2 (two) times daily.

## 2015-10-18 NOTE — Progress Notes (Signed)
Occupational Therapy Treatment Patient Details Name: POLLYANN COCKE MRN: [id number] DOB: 1955/06/24 Today's Date: 10/18/2015    History of present illness 60 yo s/p Left parasagittal craniotomy for resection of meningioma, supratentorial; use of stereotactic navigation.   OT comments  Pt seems hesitant to perform functional mobility/activities; requires verbal encouragement but she is very willing to participate. Pt reports she is nervous about returning home today and her husband is worried about caring for her at home alone. Pt able to perform LB dressing, toilet transfer, peri care, and grooming activities standing at the sink with min guard assist. Pt moves slowly and reports she feels like she just "needs to take her time right now". D/c plan remains appropriate. Will continue to follow acutely.   Follow Up Recommendations  Home health OT;Supervision/Assistance - 24 hour    Equipment Recommendations  None recommended by OT    Recommendations for Other Services      Precautions / Restrictions Precautions Precautions: Fall       Mobility Bed Mobility               General bed mobility comments: Pt OOB in chair upon arrival.  Transfers Overall transfer level: Needs assistance Equipment used: Rolling walker (2 wheeled) Transfers: Sit to/from Stand Sit to Stand: Min guard         General transfer comment: VCs for hand placement.    Balance Overall balance assessment: Needs assistance Sitting-balance support: Feet supported;No upper extremity supported Sitting balance-Leahy Scale: Fair     Standing balance support: No upper extremity supported;During functional activity Standing balance-Leahy Scale: Fair                     ADL Overall ADL's : Needs assistance/impaired     Grooming: Min guard;Standing;Oral care;Wash/dry face;Wash/dry hands               Lower Body Dressing: Min guard;Sit to/from stand Lower Body Dressing Details (indicate cue  type and reason): Pt able to pull up socks sitting in chair Toilet Transfer: Min guard;Ambulation;BSC;RW Toilet Transfer Details (indicate cue type and reason): Cues for legs touching prior commode prior to sitting and reaching back for handles. Toileting- Water quality scientist and Hygiene: Min guard;Sit to/from stand Toileting - Clothing Manipulation Details (indicate cue type and reason): for toilet hygiene     Functional mobility during ADLs: Min guard;Rolling walker General ADL Comments: Pt moving very slow but pt reports it is due to feeling weak and pt wanting to be safe. Pt concerned about d/c home; requires a lot of verbal encouragement.       Vision                     Perception     Praxis      Cognition   Behavior During Therapy: Flat affect Overall Cognitive Status: Impaired/Different from baseline Area of Impairment: Following commands;Problem solving        Following Commands: Follows one step commands consistently;Follows one step commands with increased time     Problem Solving: Slow processing;Requires verbal cues General Comments: Slow processing and functional movement but all appropriate. Pt reports she just feels like she needs to take things slow right now.    Extremity/Trunk Assessment               Exercises     Shoulder Instructions       General Comments      Pertinent Vitals/ Pain  Pain Assessment: 0-10 Pain Score: 4  Pain Location: head Pain Descriptors / Indicators: Dull;Aching;Tightness Pain Intervention(s): Monitored during session  Home Living                                          Prior Functioning/Environment              Frequency Min 2X/week     Progress Toward Goals  OT Goals(current goals can now be found in the care plan section)  Progress towards OT goals: Progressing toward goals  Acute Rehab OT Goals Patient Stated Goal: home today OT Goal Formulation: With patient   Plan Discharge plan remains appropriate    Co-evaluation                 End of Session Equipment Utilized During Treatment: Gait belt;Rolling walker   Activity Tolerance Patient tolerated treatment well   Patient Left in chair;with call bell/phone within reach   Nurse Communication Mobility status        Time: [id number] OT Time Calculation (min): 24 min  Charges: OT General Charges $OT Visit: 1 Procedure OT Treatments $Self Care/Home Management : 23-37 mins  Binnie Kand M.S., OTR/L Pager: [phone]  10/18/2015, 11:47 AM

## 2015-10-18 NOTE — Care Management Important Message (Signed)
Important Message  Patient Details  Name: Julia Poole MRN: [id number] Date of Birth: [birth year]   Medicare Important Message Given:  Yes    [name] 10/18/2015, 10:07 AM

## 2015-10-21 DIAGNOSIS — D320 Benign neoplasm of cerebral meninges: Secondary | ICD-10-CM | POA: Diagnosis not present

## 2015-10-21 DIAGNOSIS — Z483 Aftercare following surgery for neoplasm: Secondary | ICD-10-CM | POA: Diagnosis not present

## 2015-10-21 DIAGNOSIS — Z853 Personal history of malignant neoplasm of breast: Secondary | ICD-10-CM | POA: Diagnosis not present

## 2015-10-21 DIAGNOSIS — M150 Primary generalized (osteo)arthritis: Secondary | ICD-10-CM | POA: Diagnosis not present

## 2015-10-21 DIAGNOSIS — E785 Hyperlipidemia, unspecified: Secondary | ICD-10-CM | POA: Diagnosis not present

## 2015-10-23 DIAGNOSIS — Z853 Personal history of malignant neoplasm of breast: Secondary | ICD-10-CM | POA: Diagnosis not present

## 2015-10-23 DIAGNOSIS — E785 Hyperlipidemia, unspecified: Secondary | ICD-10-CM | POA: Diagnosis not present

## 2015-10-23 DIAGNOSIS — M150 Primary generalized (osteo)arthritis: Secondary | ICD-10-CM | POA: Diagnosis not present

## 2015-10-23 DIAGNOSIS — D320 Benign neoplasm of cerebral meninges: Secondary | ICD-10-CM | POA: Diagnosis not present

## 2015-10-23 DIAGNOSIS — Z483 Aftercare following surgery for neoplasm: Secondary | ICD-10-CM | POA: Diagnosis not present

## 2015-10-25 DIAGNOSIS — Z853 Personal history of malignant neoplasm of breast: Secondary | ICD-10-CM | POA: Diagnosis not present

## 2015-10-25 DIAGNOSIS — D320 Benign neoplasm of cerebral meninges: Secondary | ICD-10-CM | POA: Diagnosis not present

## 2015-10-25 DIAGNOSIS — E785 Hyperlipidemia, unspecified: Secondary | ICD-10-CM | POA: Diagnosis not present

## 2015-10-25 DIAGNOSIS — M150 Primary generalized (osteo)arthritis: Secondary | ICD-10-CM | POA: Diagnosis not present

## 2015-10-25 DIAGNOSIS — Z483 Aftercare following surgery for neoplasm: Secondary | ICD-10-CM | POA: Diagnosis not present

## 2015-10-27 DIAGNOSIS — M150 Primary generalized (osteo)arthritis: Secondary | ICD-10-CM | POA: Diagnosis not present

## 2015-10-27 DIAGNOSIS — D320 Benign neoplasm of cerebral meninges: Secondary | ICD-10-CM | POA: Diagnosis not present

## 2015-10-27 DIAGNOSIS — Z853 Personal history of malignant neoplasm of breast: Secondary | ICD-10-CM | POA: Diagnosis not present

## 2015-10-27 DIAGNOSIS — Z483 Aftercare following surgery for neoplasm: Secondary | ICD-10-CM | POA: Diagnosis not present

## 2015-10-27 DIAGNOSIS — E785 Hyperlipidemia, unspecified: Secondary | ICD-10-CM | POA: Diagnosis not present

## 2015-10-31 DIAGNOSIS — D320 Benign neoplasm of cerebral meninges: Secondary | ICD-10-CM | POA: Diagnosis not present

## 2015-10-31 DIAGNOSIS — Z483 Aftercare following surgery for neoplasm: Secondary | ICD-10-CM | POA: Diagnosis not present

## 2015-10-31 DIAGNOSIS — Z853 Personal history of malignant neoplasm of breast: Secondary | ICD-10-CM | POA: Diagnosis not present

## 2015-10-31 DIAGNOSIS — E785 Hyperlipidemia, unspecified: Secondary | ICD-10-CM | POA: Diagnosis not present

## 2015-10-31 DIAGNOSIS — M150 Primary generalized (osteo)arthritis: Secondary | ICD-10-CM | POA: Diagnosis not present

## 2015-11-02 DIAGNOSIS — E785 Hyperlipidemia, unspecified: Secondary | ICD-10-CM | POA: Diagnosis not present

## 2015-11-02 DIAGNOSIS — M150 Primary generalized (osteo)arthritis: Secondary | ICD-10-CM | POA: Diagnosis not present

## 2015-11-02 DIAGNOSIS — Z483 Aftercare following surgery for neoplasm: Secondary | ICD-10-CM | POA: Diagnosis not present

## 2015-11-02 DIAGNOSIS — D320 Benign neoplasm of cerebral meninges: Secondary | ICD-10-CM | POA: Diagnosis not present

## 2015-11-02 DIAGNOSIS — Z853 Personal history of malignant neoplasm of breast: Secondary | ICD-10-CM | POA: Diagnosis not present

## 2015-11-06 ENCOUNTER — Encounter: Payer: Self-pay | Admitting: Genetic Counselor

## 2015-11-06 DIAGNOSIS — E785 Hyperlipidemia, unspecified: Secondary | ICD-10-CM | POA: Diagnosis not present

## 2015-11-06 DIAGNOSIS — M150 Primary generalized (osteo)arthritis: Secondary | ICD-10-CM | POA: Diagnosis not present

## 2015-11-06 DIAGNOSIS — Z483 Aftercare following surgery for neoplasm: Secondary | ICD-10-CM | POA: Diagnosis not present

## 2015-11-06 DIAGNOSIS — D320 Benign neoplasm of cerebral meninges: Secondary | ICD-10-CM | POA: Diagnosis not present

## 2015-11-06 DIAGNOSIS — Z853 Personal history of malignant neoplasm of breast: Secondary | ICD-10-CM | POA: Diagnosis not present

## 2015-11-07 ENCOUNTER — Encounter: Payer: Federal, State, Local not specified - PPO | Admitting: Gastroenterology

## 2015-11-07 DIAGNOSIS — Z483 Aftercare following surgery for neoplasm: Secondary | ICD-10-CM | POA: Diagnosis not present

## 2015-11-07 DIAGNOSIS — M150 Primary generalized (osteo)arthritis: Secondary | ICD-10-CM | POA: Diagnosis not present

## 2015-11-07 DIAGNOSIS — D320 Benign neoplasm of cerebral meninges: Secondary | ICD-10-CM | POA: Diagnosis not present

## 2015-11-07 DIAGNOSIS — Z853 Personal history of malignant neoplasm of breast: Secondary | ICD-10-CM | POA: Diagnosis not present

## 2015-11-07 DIAGNOSIS — E785 Hyperlipidemia, unspecified: Secondary | ICD-10-CM | POA: Diagnosis not present

## 2015-11-08 DIAGNOSIS — M150 Primary generalized (osteo)arthritis: Secondary | ICD-10-CM | POA: Diagnosis not present

## 2015-11-08 DIAGNOSIS — Z483 Aftercare following surgery for neoplasm: Secondary | ICD-10-CM | POA: Diagnosis not present

## 2015-11-08 DIAGNOSIS — E785 Hyperlipidemia, unspecified: Secondary | ICD-10-CM | POA: Diagnosis not present

## 2015-11-08 DIAGNOSIS — D320 Benign neoplasm of cerebral meninges: Secondary | ICD-10-CM | POA: Diagnosis not present

## 2015-11-08 DIAGNOSIS — Z853 Personal history of malignant neoplasm of breast: Secondary | ICD-10-CM | POA: Diagnosis not present

## 2015-11-09 DIAGNOSIS — D320 Benign neoplasm of cerebral meninges: Secondary | ICD-10-CM | POA: Diagnosis not present

## 2015-11-09 DIAGNOSIS — M150 Primary generalized (osteo)arthritis: Secondary | ICD-10-CM | POA: Diagnosis not present

## 2015-11-09 DIAGNOSIS — Z853 Personal history of malignant neoplasm of breast: Secondary | ICD-10-CM | POA: Diagnosis not present

## 2015-11-09 DIAGNOSIS — Z483 Aftercare following surgery for neoplasm: Secondary | ICD-10-CM | POA: Diagnosis not present

## 2015-11-09 DIAGNOSIS — E785 Hyperlipidemia, unspecified: Secondary | ICD-10-CM | POA: Diagnosis not present

## 2015-11-09 NOTE — Progress Notes (Signed)
Location/Histology of Brain Tumor:    Patient presented with symptoms of: Intermittent mild frontal headaches, memory impairment, confusion and gait imbalance. As per nephew, noted that she would freeze while doing something in the kitchen, looked blankly for a few seconds then done around to ask what happened. She started using her spouse's cane   Past or anticipated interventions, if any, per neurosurgery: 10/16/15: Craniotomy Dr. Cyndy Freeze  Past or anticipated interventions, if any, per medical oncology: None discussed  Dose of Decadron, if applicable: She finished a Medrol Dose pak last week, per her report  Recent neurologic symptoms, if any:   Seizures: No  Headaches: yes, she had dull headaches after surgery, but they have improved.   Nausea: No  Dizziness/ataxia: No  Difficulty with hand coordination: No  Focal numbness/weakness: She reports some tingling in her hands. She does have a history of carpel tunnel.   Visual deficits/changes: She reports being light sensitive with bright lights.   Confusion/Memory deficits: No    SAFETY ISSUES:  Prior radiation? Yes, DCIS right breast status post lumpectomy August 2010 ER/PR positive status post radiation therapy and tamoxifen for the past 5 years. She received radiation therapy from 12/21/2008 to 01/31/2009 to a total of 60 gray.  Pacemaker/ICD? No  Possible current pregnancy? No  Is the patient on methotrexate? No  Additional Complaints / other details: DCIS right breast status post lumpectomy August 2010 ER/PR positive status post radiation therapy and tamoxifen 5 years (she completed tamoxifen therapy).  BP 115/74   Pulse 83   Temp 98.6 F (37 C)   Ht 5\' 5"  (1.651 m)   Wt 188 lb (85.3 kg)   SpO2 100% Comment: room air  BMI 31.28 kg/m    Wt Readings from Last 3 Encounters:  11/15/15 188 lb (85.3 kg)  10/16/15 195 lb 8.8 oz (88.7 kg)  10/10/15 194 lb (88 kg)

## 2015-11-10 ENCOUNTER — Other Ambulatory Visit: Payer: Self-pay | Admitting: Nurse Practitioner

## 2015-11-10 DIAGNOSIS — E034 Atrophy of thyroid (acquired): Secondary | ICD-10-CM

## 2015-11-10 MED ORDER — LEVOTHYROXINE SODIUM 50 MCG PO TABS: 50.0000 ug | ORAL_TABLET | Freq: Every day | ORAL | 1 refills | Status: DC

## 2015-11-10 NOTE — Telephone Encounter (Signed)
done

## 2015-11-15 ENCOUNTER — Ambulatory Visit
Admission: RE | Admit: 2015-11-15 | Discharge: 2015-11-15 | Disposition: A | Discharge status: Home / Self Care | Payer: Medicare Other | Source: Ambulatory Visit | Attending: Radiation Oncology | Admitting: Radiation Oncology

## 2015-11-15 ENCOUNTER — Encounter: Payer: Self-pay | Admitting: Radiation Oncology

## 2015-11-15 VITALS — BP 115/74 | HR 83 | Temp 98.6°F | Ht 65.0 in | Wt 188.0 lb

## 2015-11-15 DIAGNOSIS — Z510 Encounter for antineoplastic radiation therapy: Secondary | ICD-10-CM | POA: Insufficient documentation

## 2015-11-15 DIAGNOSIS — D420 Neoplasm of uncertain behavior of cerebral meninges: Secondary | ICD-10-CM | POA: Insufficient documentation

## 2015-11-15 DIAGNOSIS — Z79899 Other long term (current) drug therapy: Secondary | ICD-10-CM | POA: Diagnosis not present

## 2015-11-15 DIAGNOSIS — Z7982 Long term (current) use of aspirin: Secondary | ICD-10-CM | POA: Diagnosis not present

## 2015-11-15 DIAGNOSIS — C320 Malignant neoplasm of glottis: Secondary | ICD-10-CM | POA: Diagnosis not present

## 2015-11-15 MED ORDER — LORAZEPAM 0.5 MG PO TABS: ORAL_TABLET | ORAL | 0 refills | Status: DC

## 2015-11-15 NOTE — Progress Notes (Signed)
Radiation Oncology         [phone] ________________________________  Initial outpatient Consultation  Name: Julia Poole MRN: [id number]  Date: 11/15/2015  DOB: Dec 18, 1955  CC:[name] Done, FNP  Ditty, Kevan Ny, *   REFERRING PHYSICIAN: Ditty, Kevan Ny, *  DIAGNOSIS: The encounter diagnosis was Atypical meningioma of brain [facility]).  Grade II resected meningioma, left parietal lobe, with 3 other meningiomas, unresected   ICD-9-CM ICD-10-CM   1. Atypical meningioma of brain (HCC) 237.6 D42.0 LORazepam (ATIVAN) 0.5 MG tablet     HISTORY OF PRESENT ILLNESS::Julia Poole is a 60 y.o. female who originally presented with intermittent frontal headaches, memory impairment, confusion, and gait imbalance. As per nephew, she would freeze while doing something in the kitchen, looked blankly for a few seconds then turn around to ask what happened. She also started using her spouse's cane.  MRI of the brain on 10/14/15 revealed 3.1 x 3.8 x 3.6 cm left parafalcine meningioma within the parietal lobe with associated vasogenic edema. Also noted was a smaller 1.5 x 1.1 x 1.3 cm right parafalcine meningioma with additional smaller meningiomas overlying the right frontal and left parietal convexities. Accordingly she underwent craniotomy with Dr. Cyndy Freeze on 10/16/15.  Left parietal parasagittal mass from 10/16/15 reveals atypical meningioma (WHO Grade II)  Most recent MRI of the brain on 10/17/15 demonstrated good appearance following what appears to be a complete resection of the left posterior falcine meningioma. Small amount of blood products at the site of resection as expected. Residual  brain edema as expected. No evidence of perioperative infarction or extra-axial collection. Three other meningioma are unchanged.   Her case was reviewed at CNS board and I discussed her with Dr Cyndy Freeze.  After the surgery, she has become light sensitive to bright light and has experienced a dull  headache which goes away. She has also noticed a tunnel in her hearing. She has no longer been experiencing moments of spacing out.  She reports some tingling in her hands. She does have a history of carpel tunnel. She denies seizures, nausea, dizziness/ataxia, difficulty with hand coordination, or confusion/memory deficits.  She previously experienced a "strain" at the edge of her vision but has not noticed this today. Her husband attributes this to her possibly not having taken her seizure medication this morning. The patient notes she could not remember whether or not she had already taken it and she did not want to take the medication twice by accident.   The patient reports she is very claustrophobic. When she has an MRI she takes a Valium and puts a cloth over her eyes. She becomes very anxious and hyperventilates. She does not take an anti anxiety medication regularly, just for MRIs.   She reports she has back problems which make laying flat uncomfortable. She did not have any difficulty receiving prior breast radiation therapy.  The patient is concerned whether radiation treatment will cause changes in her mental function, including personality, speech, or physical ability. She is confused as she was told by the neurologist that her tumor was benign.   The patient lives in [place] but would prefer to be treated in [place].  She sees [name] Done, FNP in [place] for PCP. She receives regular annual mammograms.  Her sister had leukemia and died about 4.5 years ago. Her brother who is since deceased had a tumor in the chest and brain which were unrelated. Her living brother is having skin cancer removed from his head.  PREVIOUS RADIATION THERAPY: Yes, DCIS right breast status post lumpectomy August 2010 ER/PR positive status post radiation therapy and tamoxifen for the past 5 years. She received radiation therapy from 12/21/2008 to 01/31/2009 to a total of 60 Gy with Dr.  Cletus Gash.  PAST MEDICAL HISTORY:  has a past medical history of Allergy; Arthritis; Bleeding nose; Cancer ([place]); Degenerative disk disease (2012); Hemorrhoids; Hyperlipidemia; Sinus problem; and Thyroid disease.    PAST SURGICAL HISTORY: Past Surgical History:  Procedure Laterality Date  . BREAST SURGERY  AUG. 2010   right breast lumpectomy  . BREAST SURGERY     Cancer  . COLONOSCOPY    . CRANIOTOMY Left 10/16/2015   Procedure: PARA-SAGITTAL CRANIOTOMY WITH STEREOTACTIC NAVIGATION FOR TUMOR;  Surgeon: Kevan Ny Ditty, MD;  Location: [facility];  Service: Neurosurgery;  Laterality: Left;  . GANGLION CYST EXCISION  SEP. 1997   RIGHT WRIST    FAMILY HISTORY: family history includes Cancer in her brother, brother, father, maternal aunt, mother, and sister; Colon cancer in her mother; Diabetes in her father; Heart disease in her father.  SOCIAL HISTORY:  reports that she has never smoked. She has never used smokeless tobacco. She reports that she does not drink alcohol or use drugs.  ALLERGIES: Review of patient's allergies indicates no known allergies.  MEDICATIONS:  Current Outpatient Prescriptions  Medication Sig Dispense Refill  . acetaminophen-codeine (TYLENOL #3) 300-30 MG tablet TAKE ONE TABLET BY MOUTH EVERY FOUR HOURS AS NEEDED FOR PAIN 120 tablet 2  . Ascorbic Acid (VITAMIN C) 500 MG tablet Take 500 mg by mouth daily.     Marland Kitchen aspirin 81 MG tablet Take 81 mg by mouth daily.      . Calcium-Vitamin D (CALTRATE 600 PLUS-VIT D PO) Take 600 mg by mouth 2 (two) times daily.    . Cholecalciferol (VITAMIN D) 2000 UNITS tablet Take 2,000 Units by mouth 2 (two) times daily.    . cyanocobalamin 100 MCG tablet Take 100 mcg by mouth daily.    Marland Kitchen FLAXSEED, LINSEED, PO Take by mouth.    Marland Kitchen GARLIC PO Take [id number] mg by mouth daily.    Marland Kitchen levETIRAcetam (KEPPRA) 1000 MG tablet Take 1 tablet (1,000 mg total) by mouth 2 (two) times daily. 60 tablet 5  . levothyroxine (SYNTHROID, LEVOTHROID)  50 MCG tablet Take 1 tablet (50 mcg total) by mouth daily. 90 tablet 1  . Multiple Vitamins-Minerals (CENTRUM SILVER PO) Take by mouth.      Marland Kitchen oxaprozin (DAYPRO) 600 MG tablet Take 300 mg by mouth daily.     . rosuvastatin (CRESTOR) 10 MG tablet TAKE ONE TABLET BY MOUTH ONE TIME DAILY 90 tablet 0  . fish oil-omega-3 fatty acids 1000 MG capsule Take 1 g by mouth daily.    Marland Kitchen HYDROcodone-acetaminophen (NORCO/VICODIN) 5-325 MG tablet Take 1 tablet by mouth every 4 (four) hours as needed for moderate pain. (Patient not taking: Reported on 11/15/2015) 60 tablet 0  . LORazepam (ATIVAN) 0.5 MG tablet Take 1-2 tabs as needed, about 20-30 min prior to wearing radiation mask 16 tablet 0  . methylPREDNISolone (MEDROL) 4 MG tablet Dispense 1 medrol dose pack, take as directed (Patient not taking: Reported on 11/15/2015) 21 tablet 0   No current facility-administered medications for this encounter.     REVIEW OF SYSTEMS: as above   PHYSICAL EXAM:  height is 5\' 5"  (1.651 m) and weight is 188 lb (85.3 kg). Her temperature is 98.6 F (37 C). Her blood pressure is  115/74 and her pulse is 83. Her oxygen saturation is 100%.      General: Alert and oriented, in no acute distress HEENT: Head is normocephalic. Extraocular movements are intact. Oropharynx is clear. Oral cavity is clear with no thrush. Neck: Neck is supple, no palpable cervical or supraclavicular lymphadenopathy. Heart: Regular in rate and rhythm with no murmurs, rubs, or gallops. Chest: Clear to auscultation bilaterally, with no rhonchi, wheezes, or rales. Abdomen: Soft, nontender, nondistended, with no rigidity or guarding. Extremities: No cyanosis or edema. Lymphatics: see Neck Exam Skin: No concerning lesions. Surgical scar over scalp has a little scabbing but is healing well. Musculoskeletal: Symmetric strength and muscle tone throughout.  Neurologic: Cranial nerves II through XII are grossly intact. No obvious focalities. Speech is fluent.  Coordination is intact. Three out of three object recall at 3 minutes.  Psychiatric: Judgment and insight are intact. Affect is appropriate.   LABORATORY DATA:  Lab Results  Component Value Date   WBC 20.9 (H) 10/17/2015   HGB 9.7 (L) 10/17/2015   HCT 29.5 (L) 10/17/2015   MCV 92.8 10/17/2015   PLT 307 10/17/2015   CMP     Component Value Date/Time   NA 140 10/17/2015 0352   NA 142 05/19/2015 1044   NA 142 01/25/2014 0954   K 4.1 10/17/2015 0352   K 3.9 01/25/2014 0954   CL 110 10/17/2015 0352   CL 108 (H) 01/20/2012 0931   CO2 22 10/17/2015 0352   CO2 28 01/25/2014 0954   GLUCOSE 168 (H) 10/17/2015 0352   GLUCOSE 104 01/25/2014 0954   GLUCOSE 85 01/20/2012 0931   BUN 23 (H) 10/17/2015 0352   BUN 15 05/19/2015 1044   BUN 18.0 01/25/2014 0954   CREATININE 0.80 10/17/2015 0352   CREATININE 0.7 01/25/2014 0954   CALCIUM 8.4 (L) 10/17/2015 0352   CALCIUM 9.6 01/25/2014 0954   PROT 6.8 10/14/2015 0632   PROT 6.7 05/19/2015 1044   PROT 6.6 01/25/2014 0954   ALBUMIN 3.3 (L) 10/14/2015 0632   ALBUMIN 4.3 05/19/2015 1044   ALBUMIN 3.7 01/25/2014 0954   AST 28 10/14/2015 0632   AST 33 01/25/2014 0954   ALT 28 10/14/2015 0632   ALT 37 01/25/2014 0954   ALKPHOS 101 10/14/2015 0632   ALKPHOS 66 01/25/2014 0954   BILITOT 0.6 10/14/2015 0632   BILITOT 0.6 05/19/2015 1044   BILITOT 0.62 01/25/2014 0954   GFRNONAA >60 10/17/2015 0352   GFRNONAA >89 08/20/2012 1038   GFRAA >60 10/17/2015 0352   GFRAA >89 08/20/2012 1038         RADIOGRAPHY:  As above    IMPRESSION/PLAN: This is a very pleasant 60 year old female with 4 meningiomas detected on a Brain MRI. The largest was resected and found to be Grade II. Statistically speaking the other 3 meningioma which are smaller have a likelihood of also being Grade II. This type of meningioma can behave more aggressively than benign meningiomas. The second largest meningioma has not been resected due to expected morbidity associated  with resection per Dr Cyndy Freeze. My recommendation is to treat the tumor bed post operatively and also target the meningiomas along the falx, anterior to the tumor board with fractionated radiotherapy. I would treat these areas with 1 cm dural and 0.5 cm brain CTV margin, plus PTV margin to 55.8 Gy at 1.8 Gy per fraction. I would boost the gross disease to 59.4 Gy. The patient's smaller and more peripheral 2 meningiomas can be treated with  SRS with 15 Gy in 1 fraction following completion of her fractionated radiation therapy.   We reviewed pathology and MRI results in depth. We discussed the risks, benefits, and side effects of these treatments. These side effects include fatigue, patchy alopecia, headaches, nausea, taste change, or short-term memory loss. The patient signed a CT simulation consent form today. She will be scheduled for CT simulation soon.   She seemed a bit disappointed that her disease pathology is atypical rather than benign. Also disappointed that treatment cannot safely be given in 1 fraction to all sites.  We discussed her path in detail, and the rationale for my treatment recommendations.  All questions were answered to the best of my ability.  I have written a prescription for lorazepam 0.5 mg to be taken as needed to ease claustrophobia with treatment.   I have referred the patient for consultation with genetics. Also to social work.   __________________________________________   Eppie Gibson, MD   This document serves as a record of services personally performed by Eppie Gibson, MD. It was created on her behalf by Arlyce Harman, a trained medical scribe. The creation of this record is based on the scribe's personal observations and the provider's statements to them. This document has been checked and approved by the attending provider.

## 2015-11-17 ENCOUNTER — Other Ambulatory Visit: Payer: Self-pay | Admitting: Radiation Oncology

## 2015-11-17 DIAGNOSIS — D420 Neoplasm of uncertain behavior of cerebral meninges: Secondary | ICD-10-CM

## 2015-11-21 ENCOUNTER — Encounter: Payer: Self-pay | Admitting: Nurse Practitioner

## 2015-11-21 ENCOUNTER — Ambulatory Visit (INDEPENDENT_AMBULATORY_CARE_PROVIDER_SITE_OTHER): Payer: Medicare Other | Admitting: Nurse Practitioner

## 2015-11-21 VITALS — BP 116/70 | HR 80 | Temp 98.0°F | Ht 65.0 in | Wt 189.8 lb

## 2015-11-21 DIAGNOSIS — E785 Hyperlipidemia, unspecified: Secondary | ICD-10-CM | POA: Diagnosis not present

## 2015-11-21 DIAGNOSIS — D420 Neoplasm of uncertain behavior of cerebral meninges: Secondary | ICD-10-CM | POA: Diagnosis not present

## 2015-11-21 DIAGNOSIS — Z6832 Body mass index (BMI) 32.0-32.9, adult: Secondary | ICD-10-CM | POA: Diagnosis not present

## 2015-11-21 DIAGNOSIS — E038 Other specified hypothyroidism: Secondary | ICD-10-CM | POA: Diagnosis not present

## 2015-11-21 DIAGNOSIS — E034 Atrophy of thyroid (acquired): Secondary | ICD-10-CM | POA: Diagnosis not present

## 2015-11-21 MED ORDER — ROSUVASTATIN CALCIUM 10 MG PO TABS: 10.0000 mg | ORAL_TABLET | Freq: Every day | ORAL | 1 refills | Status: DC

## 2015-11-21 MED ORDER — LEVOTHYROXINE SODIUM 50 MCG PO TABS: 50.0000 ug | ORAL_TABLET | Freq: Every day | ORAL | 1 refills | Status: DC

## 2015-11-21 NOTE — Progress Notes (Signed)
Subjective:    Patient ID: Julia Poole, female    DOB: 12/28/1955, 60 y.o.   MRN: [id number]  Patient here today for follow up of chronic medical problems. SHe is doing quite well- she was seen 10/06/15 with body aches and fever and was thought to have virus- she came back in 1 week later with confusion and husband caused strange behavior. A referral was made to neurology. Her confusion continued to worsen and husband took her to the ER where it was determined that she had a brian mass- Surgery was performed on 04/18/15 and she was found to have meningioma of the brain which was benign. SHe is doing quite well and seems to have no residual effect from surgery. She was sent home on keppra to prevent any seizure activity which she denies has occurred. Still has 3 meeningioma left in there that they are just going to watch. She is going to hav eto have radiation because they told her that they were not benign but alos were not malignant.  Outpatient Encounter Prescriptions as of 11/21/2015  Medication Sig  . acetaminophen-codeine (TYLENOL #3) 300-30 MG tablet TAKE ONE TABLET BY MOUTH EVERY FOUR HOURS AS NEEDED FOR PAIN  . levETIRAcetam (KEPPRA) 1000 MG tablet Take 1 tablet (1,000 mg total) by mouth 2 (two) times daily.  Marland Kitchen levothyroxine (SYNTHROID, LEVOTHROID) 50 MCG tablet Take 1 tablet (50 mcg total) by mouth daily.  Marland Kitchen LORazepam (ATIVAN) 0.5 MG tablet Take 1-2 tabs as needed, about 20-30 min prior to wearing radiation mask  . oxaprozin (DAYPRO) 600 MG tablet Take 300 mg by mouth daily.   . rosuvastatin (CRESTOR) 10 MG tablet TAKE ONE TABLET BY MOUTH ONE TIME DAILY    Hyperlipidemia  This is a chronic problem. Recent lipid tests were reviewed and are variable. Exacerbating diseases include obesity. She has no history of diabetes or hypothyroidism. Pertinent negatives include no myalgias. Current antihyperlipidemic treatment includes statins. The current treatment provides no improvement of lipids.  Compliance problems include adherence to diet and adherence to exercise.   Thyroid Problem  Patient reports no cold intolerance, constipation, diaphoresis, menstrual problem, palpitations, tremors or visual change. Her past medical history is significant for hyperlipidemia. There is no history of diabetes.  Chronic Low Back Pain/ spondylolisthesis Patient sees Dr. Louanne Skye & Dr. Carloyn Manner- Slipped vertebra- waiting for slippage to worsen before doing surgery. He does no do pain medication.  Review of Systems  Constitutional: Negative for diaphoresis.  Cardiovascular: Negative for palpitations.  Gastrointestinal: Negative for constipation.  Endocrine: Negative for cold intolerance.  Genitourinary: Negative for menstrual problem.  Musculoskeletal: Positive for back pain. Negative for myalgias.  Neurological: Negative for tremors.  All other systems reviewed and are negative.      Objective:   Physical Exam  Constitutional: She is oriented to person, place, and time. She appears well-developed and well-nourished.  HENT:  Nose: Nose normal.  Mouth/Throat: Oropharynx is clear and moist.  Eyes: EOM are normal.  Neck: Trachea normal, normal range of motion and full passive range of motion without pain. Neck supple. No JVD present. Carotid bruit is not present. No thyromegaly present.  Cardiovascular: Normal rate, regular rhythm, normal heart sounds and intact distal pulses.  Exam reveals no gallop and no friction rub.   No murmur heard. Pulmonary/Chest: Effort normal and breath sounds normal.  Abdominal: Soft. Bowel sounds are normal. She exhibits no distension and no mass. There is no tenderness.  Musculoskeletal: Normal range of motion.  Slowly rises  from laying position due to back pain.  Lymphadenopathy:    She has no cervical adenopathy.  Neurological: She is alert and oriented to person, place, and time. She has normal reflexes.  Skin: Skin is warm and dry.  Psychiatric: She has a normal  mood and affect. Her behavior is normal. Judgment and thought content normal.   BP 116/70 (BP Location: Left Arm, Patient Position: Sitting, Cuff Size: Normal)   Pulse 80   Temp 98 F (36.7 C) (Oral)   Ht '5\' 5"'  (1.651 m)   Wt 189 lb 12.8 oz (86.1 kg)   BMI 31.58 kg/m       Assessment & Plan:  1. Hypothyroidism due to acquired atrophy of thyroid - levothyroxine (SYNTHROID, LEVOTHROID) 50 MCG tablet; Take 1 tablet (50 mcg total) by mouth daily.  Dispense: 90 tablet; Refill: 1  2. BMI 32.0-32.9,adult Discussed diet and exercise for person with BMI >25 Will recheck weight in 3-6 months  3. Hyperlipidemia Low fta diet - CMP14+EGFR - Lipid panel - rosuvastatin (CRESTOR) 10 MG tablet; Take 1 tablet (10 mg total) by mouth daily.  Dispense: 90 tablet; Refill: 1  4. Atypical meningioma of brain [facility]) Keep follow up with neurology Prepare for radiation.    Labs pending Health maintenance reviewed Diet and exercise encouraged Continue all meds Follow up  In 3 months   [place], FNP

## 2015-11-21 NOTE — Patient Instructions (Signed)

## 2015-11-22 LAB — CMP14+EGFR
ALBUMIN: 4.3 g/dL (ref 3.6–4.8)
ALK PHOS: 96 IU/L (ref 39–117)
ALT: 26 IU/L (ref 0–32)
AST: 29 IU/L (ref 0–40)
Albumin/Globulin Ratio: 2 (ref 1.2–2.2)
BILIRUBIN TOTAL: 0.5 mg/dL (ref 0.0–1.2)
BUN / CREAT RATIO: 24 (ref 12–28)
BUN: 18 mg/dL (ref 8–27)
CHLORIDE: 103 mmol/L (ref 96–106)
CO2: 25 mmol/L (ref 18–29)
Calcium: 9.6 mg/dL (ref 8.7–10.3)
Creatinine, Ser: 0.75 mg/dL (ref 0.57–1.00)
GFR calc Af Amer: 99 mL/min/{1.73_m2} (ref >59)
GFR calc non Af Amer: 86 mL/min/{1.73_m2} (ref >59)
GLUCOSE: 98 mg/dL (ref 65–99)
Globulin, Total: 2.2 g/dL (ref 1.5–4.5)
Potassium: 4.1 mmol/L (ref 3.5–5.2)
SODIUM: 141 mmol/L (ref 134–144)
Total Protein: 6.5 g/dL (ref 6.0–8.5)

## 2015-11-22 LAB — LIPID PANEL
CHOLESTEROL TOTAL: 130 mg/dL (ref 100–199)
Chol/HDL Ratio: 2.8 ratio units (ref 0.0–4.4)
HDL: 46 mg/dL (ref >39)
LDL Calculated: 67 mg/dL (ref 0–99)
Triglycerides: 84 mg/dL (ref 0–149)
VLDL CHOLESTEROL CAL: 17 mg/dL (ref 5–40)

## 2015-11-27 ENCOUNTER — Ambulatory Visit
Admission: RE | Admit: 2015-11-27 | Discharge: 2015-11-27 | Disposition: A | Discharge status: Home / Self Care | Payer: Medicare Other | Source: Ambulatory Visit | Attending: Radiation Oncology | Admitting: Radiation Oncology

## 2015-11-27 DIAGNOSIS — D420 Neoplasm of uncertain behavior of cerebral meninges: Secondary | ICD-10-CM | POA: Diagnosis not present

## 2015-11-27 DIAGNOSIS — Z79899 Other long term (current) drug therapy: Secondary | ICD-10-CM | POA: Diagnosis not present

## 2015-11-27 DIAGNOSIS — Z7982 Long term (current) use of aspirin: Secondary | ICD-10-CM | POA: Diagnosis not present

## 2015-11-27 DIAGNOSIS — Z510 Encounter for antineoplastic radiation therapy: Secondary | ICD-10-CM | POA: Diagnosis not present

## 2015-11-27 NOTE — Progress Notes (Signed)
  Radiation Oncology         [phone]) [phone] ________________________________  Name: Julia Poole MRN: [id number]  Date: 11/27/2015  DOB: 12-02-1955  SIMULATION AND TREATMENT PLANNING NOTE  outpatient  DIAGNOSIS:     ICD-9-CM ICD-10-CM   1. Atypical meningioma of brain ([place]) 237.6 D42.0     NARRATIVE:  The patient was brought to the [facility].  Identity was confirmed.  All relevant records and images related to the planned course of therapy were reviewed.  The patient freely provided informed written consent to proceed with treatment after reviewing the details related to the planned course of therapy. The consent form was witnessed and verified by the simulation staff.    Then, the patient was set-up in a stable reproducible  supine position for radiation therapy.  CT images were obtained.  Surface markings were placed.  The CT images were loaded into the planning software.    TREATMENT PLANNING NOTE: Treatment planning then occurred.  The radiation prescription was entered and confirmed.    A total of 1 medically necessary complex treatment devices were fabricated and supervised by me, in the form of aquaplast mask for immobilization. MORE FIELDS WITH MLCs MAY BE ADDED IN DOSIMETRY for dose homogeneity.  I have requested : Intensity Modulated Radiotherapy (IMRT) is medically necessary for this case for the following reason:  Critical CNS structure avoidance - brainstem, optic chiasm, optic nerve..    The patient will receive 55.8 Gy in 31 fractions to the midline falx meningioma cavity and intact meningioma at the falx. Intact disease will then be boosted with an additional 3.6Gy/78fx.   -----------------------------------  Eppie Gibson, MD

## 2015-11-29 DIAGNOSIS — Z7982 Long term (current) use of aspirin: Secondary | ICD-10-CM | POA: Diagnosis not present

## 2015-11-29 DIAGNOSIS — Z510 Encounter for antineoplastic radiation therapy: Secondary | ICD-10-CM | POA: Diagnosis not present

## 2015-11-29 DIAGNOSIS — Z79899 Other long term (current) drug therapy: Secondary | ICD-10-CM | POA: Diagnosis not present

## 2015-11-29 DIAGNOSIS — D420 Neoplasm of uncertain behavior of cerebral meninges: Secondary | ICD-10-CM | POA: Diagnosis not present

## 2015-12-01 DIAGNOSIS — R921 Mammographic calcification found on diagnostic imaging of breast: Secondary | ICD-10-CM | POA: Diagnosis not present

## 2015-12-06 ENCOUNTER — Ambulatory Visit
Admission: RE | Admit: 2015-12-06 | Discharge: 2015-12-06 | Disposition: A | Discharge status: Home / Self Care | Payer: Medicare Other | Source: Ambulatory Visit | Attending: Radiation Oncology | Admitting: Radiation Oncology

## 2015-12-06 ENCOUNTER — Encounter: Payer: Self-pay | Admitting: Radiation Oncology

## 2015-12-06 VITALS — BP 98/69 | HR 69 | Temp 98.4°F | Wt 192.6 lb

## 2015-12-06 DIAGNOSIS — Z510 Encounter for antineoplastic radiation therapy: Secondary | ICD-10-CM | POA: Diagnosis not present

## 2015-12-06 DIAGNOSIS — D420 Neoplasm of uncertain behavior of cerebral meninges: Secondary | ICD-10-CM

## 2015-12-06 DIAGNOSIS — Z7982 Long term (current) use of aspirin: Secondary | ICD-10-CM | POA: Diagnosis not present

## 2015-12-06 DIAGNOSIS — Z79899 Other long term (current) drug therapy: Secondary | ICD-10-CM | POA: Diagnosis not present

## 2015-12-06 MED ORDER — SONAFINE EX EMUL
1.0000 "application " | Freq: Once | CUTANEOUS | Status: AC
  Administered 2015-12-06: 1 via TOPICAL

## 2015-12-06 NOTE — Progress Notes (Signed)
Julia Poole is here for her first treatment of radiation to her Falx Meningioma. She reports being bothered by the bright lights while receiving radiation today, but get her eyes closed. She denies pain or nausea. She has no other concerns at this time. She was provided education and given sonafine cream.   BP 98/69   Pulse 69   Temp 98.4 F (36.9 C)   Wt 192 lb 9.6 oz (87.4 kg)   SpO2 99% Comment: room air  BMI 32.05 kg/m    Wt Readings from Last 3 Encounters:  12/06/15 192 lb 9.6 oz (87.4 kg)  11/21/15 189 lb 12.8 oz (86.1 kg)  11/15/15 188 lb (85.3 kg)

## 2015-12-06 NOTE — Progress Notes (Signed)
Weekly Management Note:  outpatient    ICD-9-CM ICD-10-CM   1. Atypical meningioma of brain ([place]) 237.6 D42.0     Current Dose:  1.8 Gy  Projected Dose: 55.8 Gy   Narrative:  The patient presents for routine under treatment assessment.  CBCT/MVCT images/Port film x-rays were reviewed.  The chart was checked.  Julia Poole is here for her first treatment of radiation to her Falx Meningioma. She reports being bothered by the bright lights while receiving radiation today, but she kept her eyes closed. She denies pain or nausea. She has no other concerns at this time. Patient was concerned about hair loss.  Physical Findings:  Wt Readings from Last 3 Encounters:  12/06/15 192 lb 9.6 oz (87.4 kg)  11/21/15 189 lb 12.8 oz (86.1 kg)  11/15/15 188 lb (85.3 kg)    weight is 192 lb 9.6 oz (87.4 kg). Her temperature is 98.4 F (36.9 C). Her blood pressure is 98/69 and her pulse is 69. Her oxygen saturation is 99%.   Hair is growing back over surgical scar. She is ambulatory and in good spirits.  CBC    Component Value Date/Time   WBC 20.9 (H) 10/17/2015 0352   RBC 3.18 (L) 10/17/2015 0352   HGB 9.7 (L) 10/17/2015 0352   HGB 13.2 01/25/2014 0954   HCT 29.5 (L) 10/17/2015 0352   HCT 39.9 01/25/2014 0954   PLT 307 10/17/2015 0352   PLT 224 01/25/2014 0954   MCV 92.8 10/17/2015 0352   MCV 92.4 01/25/2014 0954   MCH 30.5 10/17/2015 0352   MCHC 32.9 10/17/2015 0352   RDW 12.9 10/17/2015 0352   RDW 12.7 01/25/2014 0954   LYMPHSABS 2.3 10/13/2015 1520   LYMPHSABS 2.4 01/25/2014 0954   MONOABS 0.6 10/13/2015 1520   MONOABS 0.5 01/25/2014 0954   EOSABS 0.2 10/13/2015 1520   EOSABS 0.2 01/25/2014 0954   EOSABS 0.2 11/07/2009 1355   BASOSABS 0.0 10/13/2015 1520   BASOSABS 0.0 01/25/2014 0954     CMP     Component Value Date/Time   NA 141 11/21/2015 1053   NA 142 01/25/2014 0954   K 4.1 11/21/2015 1053   K 3.9 01/25/2014 0954   CL 103 11/21/2015 1053   CL 108 (H) 01/20/2012 0931   CO2 25 11/21/2015 1053   CO2 28 01/25/2014 0954   GLUCOSE 98 11/21/2015 1053   GLUCOSE 168 (H) 10/17/2015 0352   GLUCOSE 104 01/25/2014 0954   GLUCOSE 85 01/20/2012 0931   BUN 18 11/21/2015 1053   BUN 18.0 01/25/2014 0954   CREATININE 0.75 11/21/2015 1053   CREATININE 0.7 01/25/2014 0954   CALCIUM 9.6 11/21/2015 1053   CALCIUM 9.6 01/25/2014 0954   PROT 6.5 11/21/2015 1053   PROT 6.6 01/25/2014 0954   ALBUMIN 4.3 11/21/2015 1053   ALBUMIN 3.7 01/25/2014 0954   AST 29 11/21/2015 1053   AST 33 01/25/2014 0954   ALT 26 11/21/2015 1053   ALT 37 01/25/2014 0954   ALKPHOS 96 11/21/2015 1053   ALKPHOS 66 01/25/2014 0954   BILITOT 0.5 11/21/2015 1053   BILITOT 0.62 01/25/2014 0954   GFRNONAA 86 11/21/2015 1053   GFRNONAA >89 08/20/2012 1038   GFRAA 99 11/21/2015 1053   GFRAA >89 08/20/2012 1038    Impression:  The patient is tolerating radiotherapy.   Plan:  Continue radiotherapy as planned. Discussed likelihood of permanent patchy hair loss, esp along central scalp.  -----------------------------------  Eppie Gibson, MD This document serves as a  record of services personally performed by Eppie Gibson, MD. It was created on her behalf by Bethann Humble, a trained medical scribe. The creation of this record is based on the scribe's personal observations and the provider's statements to them. This document has been checked and approved by the attending provider.

## 2015-12-06 NOTE — Progress Notes (Signed)
Pt here for patient teaching.  Pt given Radiation and You booklet and Sonafine. Pt reports they have not watched the Radiation Therapy Education video, but were given the link to watch at home.  Reviewed areas of pertinence such as fatigue, hair loss, nausea and vomiting, skin changes, breast swelling, cough, shortness of breath, earaches and taste changes . Pt able to give teach back of to pat skin, use unscented/gentle soap and drink plenty of water,apply Sonafine bid, avoid applying anything to skin within 4 hours of treatment and to use an electric razor if they must shave. Pt verbalizes understanding of information given and will contact nursing with any questions or concerns.     [email or web address]

## 2015-12-07 ENCOUNTER — Ambulatory Visit
Admission: RE | Admit: 2015-12-07 | Discharge: 2015-12-07 | Disposition: A | Discharge status: Home / Self Care | Payer: Medicare Other | Source: Ambulatory Visit | Attending: Radiation Oncology | Admitting: Radiation Oncology

## 2015-12-07 DIAGNOSIS — Z510 Encounter for antineoplastic radiation therapy: Secondary | ICD-10-CM | POA: Diagnosis not present

## 2015-12-07 DIAGNOSIS — D420 Neoplasm of uncertain behavior of cerebral meninges: Secondary | ICD-10-CM | POA: Diagnosis not present

## 2015-12-07 DIAGNOSIS — Z79899 Other long term (current) drug therapy: Secondary | ICD-10-CM | POA: Diagnosis not present

## 2015-12-07 DIAGNOSIS — Z7982 Long term (current) use of aspirin: Secondary | ICD-10-CM | POA: Diagnosis not present

## 2015-12-08 ENCOUNTER — Ambulatory Visit
Admission: RE | Admit: 2015-12-08 | Discharge: 2015-12-08 | Disposition: A | Discharge status: Home / Self Care | Payer: Medicare Other | Source: Ambulatory Visit | Attending: Radiation Oncology | Admitting: Radiation Oncology

## 2015-12-08 DIAGNOSIS — Z79899 Other long term (current) drug therapy: Secondary | ICD-10-CM | POA: Diagnosis not present

## 2015-12-08 DIAGNOSIS — Z510 Encounter for antineoplastic radiation therapy: Secondary | ICD-10-CM | POA: Diagnosis not present

## 2015-12-08 DIAGNOSIS — D420 Neoplasm of uncertain behavior of cerebral meninges: Secondary | ICD-10-CM | POA: Diagnosis not present

## 2015-12-08 DIAGNOSIS — Z7982 Long term (current) use of aspirin: Secondary | ICD-10-CM | POA: Diagnosis not present

## 2015-12-11 ENCOUNTER — Ambulatory Visit
Admission: RE | Admit: 2015-12-11 | Discharge: 2015-12-11 | Disposition: A | Discharge status: Home / Self Care | Payer: Medicare Other | Source: Ambulatory Visit | Attending: Radiation Oncology | Admitting: Radiation Oncology

## 2015-12-11 DIAGNOSIS — D420 Neoplasm of uncertain behavior of cerebral meninges: Secondary | ICD-10-CM | POA: Diagnosis not present

## 2015-12-11 DIAGNOSIS — Z510 Encounter for antineoplastic radiation therapy: Secondary | ICD-10-CM | POA: Diagnosis not present

## 2015-12-11 DIAGNOSIS — Z7982 Long term (current) use of aspirin: Secondary | ICD-10-CM | POA: Diagnosis not present

## 2015-12-11 DIAGNOSIS — Z79899 Other long term (current) drug therapy: Secondary | ICD-10-CM | POA: Diagnosis not present

## 2015-12-12 ENCOUNTER — Ambulatory Visit
Admission: RE | Admit: 2015-12-12 | Discharge: 2015-12-12 | Disposition: A | Discharge status: Home / Self Care | Payer: Medicare Other | Source: Ambulatory Visit | Attending: Radiation Oncology | Admitting: Radiation Oncology

## 2015-12-12 DIAGNOSIS — Z7982 Long term (current) use of aspirin: Secondary | ICD-10-CM | POA: Diagnosis not present

## 2015-12-12 DIAGNOSIS — Z79899 Other long term (current) drug therapy: Secondary | ICD-10-CM | POA: Diagnosis not present

## 2015-12-12 DIAGNOSIS — D420 Neoplasm of uncertain behavior of cerebral meninges: Secondary | ICD-10-CM | POA: Diagnosis not present

## 2015-12-12 DIAGNOSIS — Z510 Encounter for antineoplastic radiation therapy: Secondary | ICD-10-CM | POA: Diagnosis not present

## 2015-12-13 ENCOUNTER — Ambulatory Visit
Admission: RE | Admit: 2015-12-13 | Discharge: 2015-12-13 | Disposition: A | Discharge status: Home / Self Care | Payer: Medicare Other | Source: Ambulatory Visit | Attending: Radiation Oncology | Admitting: Radiation Oncology

## 2015-12-13 ENCOUNTER — Encounter: Payer: Self-pay | Admitting: Radiation Oncology

## 2015-12-13 VITALS — BP 117/65 | HR 77 | Temp 98.3°F | Ht 65.0 in | Wt 191.0 lb

## 2015-12-13 DIAGNOSIS — D420 Neoplasm of uncertain behavior of cerebral meninges: Secondary | ICD-10-CM

## 2015-12-13 DIAGNOSIS — Z7982 Long term (current) use of aspirin: Secondary | ICD-10-CM | POA: Diagnosis not present

## 2015-12-13 DIAGNOSIS — Z79899 Other long term (current) drug therapy: Secondary | ICD-10-CM | POA: Diagnosis not present

## 2015-12-13 DIAGNOSIS — Z510 Encounter for antineoplastic radiation therapy: Secondary | ICD-10-CM | POA: Diagnosis not present

## 2015-12-13 NOTE — Progress Notes (Signed)
Weekly Management Note:  outpatient    ICD-9-CM ICD-10-CM   1. Atypical meningioma of brain ([place]) 237.6 D42.0     Current Dose:  10.8 Gy  Projected Dose: 55.8 Gy   Narrative:  The patient presents for routine under treatment assessment.  CBCT/MVCT images/Port film x-rays were reviewed.  The chart was checked.   Julia Poole presents for her 6th fraction of radiation to her falx meningioma. She denies pain at this time. She does report itching on the top of her head. She is not using Sonafine because it gets stuck in her hair. She does have dry skin and redness to the surgery site. She states she is eating well. She denies nausea. She reports an occasional dull headache.  Physical Findings:  Wt Readings from Last 3 Encounters:  12/13/15 191 lb (86.6 kg)  12/06/15 192 lb 9.6 oz (87.4 kg)  11/21/15 189 lb 12.8 oz (86.1 kg)    height is 5\' 5"  (1.651 m) and weight is 191 lb (86.6 kg). Her temperature is 98.3 F (36.8 C). Her blood pressure is 117/65 and her pulse is 77. Her oxygen saturation is 100%.   Oral cavity is clear  Dry scabbing that has not completely fallen off of surgical scar. Scalp is pink.  CBC    Component Value Date/Time   WBC 20.9 (H) 10/17/2015 0352   RBC 3.18 (L) 10/17/2015 0352   HGB 9.7 (L) 10/17/2015 0352   HGB 13.2 01/25/2014 0954   HCT 29.5 (L) 10/17/2015 0352   HCT 39.9 01/25/2014 0954   PLT 307 10/17/2015 0352   PLT 224 01/25/2014 0954   MCV 92.8 10/17/2015 0352   MCV 92.4 01/25/2014 0954   MCH 30.5 10/17/2015 0352   MCHC 32.9 10/17/2015 0352   RDW 12.9 10/17/2015 0352   RDW 12.7 01/25/2014 0954   LYMPHSABS 2.3 10/13/2015 1520   LYMPHSABS 2.4 01/25/2014 0954   MONOABS 0.6 10/13/2015 1520   MONOABS 0.5 01/25/2014 0954   EOSABS 0.2 10/13/2015 1520   EOSABS 0.2 01/25/2014 0954   EOSABS 0.2 11/07/2009 1355   BASOSABS 0.0 10/13/2015 1520   BASOSABS 0.0 01/25/2014 0954     CMP     Component Value Date/Time   NA 141 11/21/2015 1053   NA 142  01/25/2014 0954   K 4.1 11/21/2015 1053   K 3.9 01/25/2014 0954   CL 103 11/21/2015 1053   CL 108 (H) 01/20/2012 0931   CO2 25 11/21/2015 1053   CO2 28 01/25/2014 0954   GLUCOSE 98 11/21/2015 1053   GLUCOSE 168 (H) 10/17/2015 0352   GLUCOSE 104 01/25/2014 0954   GLUCOSE 85 01/20/2012 0931   BUN 18 11/21/2015 1053   BUN 18.0 01/25/2014 0954   CREATININE 0.75 11/21/2015 1053   CREATININE 0.7 01/25/2014 0954   CALCIUM 9.6 11/21/2015 1053   CALCIUM 9.6 01/25/2014 0954   PROT 6.5 11/21/2015 1053   PROT 6.6 01/25/2014 0954   ALBUMIN 4.3 11/21/2015 1053   ALBUMIN 3.7 01/25/2014 0954   AST 29 11/21/2015 1053   AST 33 01/25/2014 0954   ALT 26 11/21/2015 1053   ALT 37 01/25/2014 0954   ALKPHOS 96 11/21/2015 1053   ALKPHOS 66 01/25/2014 0954   BILITOT 0.5 11/21/2015 1053   BILITOT 0.62 01/25/2014 0954   GFRNONAA 86 11/21/2015 1053   GFRNONAA >89 08/20/2012 1038   GFRAA 99 11/21/2015 1053   GFRAA >89 08/20/2012 1038    Impression:  The patient is tolerating radiotherapy.  Plan:  Continue radiotherapy as planned. Prescription was written for a wig and hat.  -----------------------------------  Eppie Gibson, MD This document serves as a record of services personally performed by Eppie Gibson, MD. It was created on her behalf by Bethann Humble, a trained medical scribe. The creation of this record is based on the scribe's personal observations and the provider's statements to them. This document has been checked and approved by the attending provider.

## 2015-12-13 NOTE — Progress Notes (Signed)
Julia Poole presents for her 6th fraction of radiation to her Falx Meningioma. She denies pain at this time. She does reports itching to the top of her head. She is not using the sonafine because it gets stuck in her hair. She does have dry skin and redness to her surgery site. She tells me she is eating well. She denies nausea, but does report an occasional dull headache.   BP 117/65   Pulse 77   Temp 98.3 F (36.8 C)   Ht 5\' 5"  (1.651 m)   Wt 191 lb (86.6 kg)   SpO2 100% Comment: room air  BMI 31.78 kg/m    Wt Readings from Last 3 Encounters:  12/13/15 191 lb (86.6 kg)  12/06/15 192 lb 9.6 oz (87.4 kg)  11/21/15 189 lb 12.8 oz (86.1 kg)

## 2015-12-14 ENCOUNTER — Ambulatory Visit
Admission: RE | Admit: 2015-12-14 | Discharge: 2015-12-14 | Disposition: A | Discharge status: Home / Self Care | Payer: Medicare Other | Source: Ambulatory Visit | Attending: Radiation Oncology | Admitting: Radiation Oncology

## 2015-12-14 DIAGNOSIS — Z79899 Other long term (current) drug therapy: Secondary | ICD-10-CM | POA: Diagnosis not present

## 2015-12-14 DIAGNOSIS — Z7982 Long term (current) use of aspirin: Secondary | ICD-10-CM | POA: Diagnosis not present

## 2015-12-14 DIAGNOSIS — Z510 Encounter for antineoplastic radiation therapy: Secondary | ICD-10-CM | POA: Diagnosis not present

## 2015-12-14 DIAGNOSIS — D420 Neoplasm of uncertain behavior of cerebral meninges: Secondary | ICD-10-CM | POA: Diagnosis not present

## 2015-12-15 ENCOUNTER — Ambulatory Visit
Admission: RE | Admit: 2015-12-15 | Discharge: 2015-12-15 | Disposition: A | Discharge status: Home / Self Care | Payer: Medicare Other | Source: Ambulatory Visit | Attending: Radiation Oncology | Admitting: Radiation Oncology

## 2015-12-15 DIAGNOSIS — Z7982 Long term (current) use of aspirin: Secondary | ICD-10-CM | POA: Diagnosis not present

## 2015-12-15 DIAGNOSIS — Z79899 Other long term (current) drug therapy: Secondary | ICD-10-CM | POA: Diagnosis not present

## 2015-12-15 DIAGNOSIS — D420 Neoplasm of uncertain behavior of cerebral meninges: Secondary | ICD-10-CM | POA: Diagnosis not present

## 2015-12-15 DIAGNOSIS — Z510 Encounter for antineoplastic radiation therapy: Secondary | ICD-10-CM | POA: Diagnosis not present

## 2015-12-18 ENCOUNTER — Ambulatory Visit: Admission: RE | Admit: 2015-12-18 | Payer: Medicare Other | Source: Ambulatory Visit | Admitting: Radiation Oncology

## 2015-12-18 ENCOUNTER — Ambulatory Visit
Admission: RE | Admit: 2015-12-18 | Discharge: 2015-12-18 | Disposition: A | Discharge status: Home / Self Care | Payer: Medicare Other | Source: Ambulatory Visit | Attending: Radiation Oncology | Admitting: Radiation Oncology

## 2015-12-18 DIAGNOSIS — D420 Neoplasm of uncertain behavior of cerebral meninges: Secondary | ICD-10-CM | POA: Diagnosis not present

## 2015-12-18 DIAGNOSIS — Z510 Encounter for antineoplastic radiation therapy: Secondary | ICD-10-CM | POA: Diagnosis not present

## 2015-12-18 DIAGNOSIS — Z79899 Other long term (current) drug therapy: Secondary | ICD-10-CM | POA: Diagnosis not present

## 2015-12-18 DIAGNOSIS — Z7982 Long term (current) use of aspirin: Secondary | ICD-10-CM | POA: Diagnosis not present

## 2015-12-19 ENCOUNTER — Ambulatory Visit
Admission: RE | Admit: 2015-12-19 | Discharge: 2015-12-19 | Disposition: A | Discharge status: Home / Self Care | Payer: Medicare Other | Source: Ambulatory Visit | Attending: Radiation Oncology | Admitting: Radiation Oncology

## 2015-12-19 DIAGNOSIS — Z7982 Long term (current) use of aspirin: Secondary | ICD-10-CM | POA: Diagnosis not present

## 2015-12-19 DIAGNOSIS — Z510 Encounter for antineoplastic radiation therapy: Secondary | ICD-10-CM | POA: Diagnosis not present

## 2015-12-19 DIAGNOSIS — D420 Neoplasm of uncertain behavior of cerebral meninges: Secondary | ICD-10-CM | POA: Diagnosis not present

## 2015-12-19 DIAGNOSIS — Z79899 Other long term (current) drug therapy: Secondary | ICD-10-CM | POA: Diagnosis not present

## 2015-12-20 ENCOUNTER — Ambulatory Visit
Admission: RE | Admit: 2015-12-20 | Discharge: 2015-12-20 | Disposition: A | Discharge status: Home / Self Care | Payer: Medicare Other | Source: Ambulatory Visit | Attending: Radiation Oncology | Admitting: Radiation Oncology

## 2015-12-20 ENCOUNTER — Encounter: Payer: Self-pay | Admitting: Radiation Oncology

## 2015-12-20 VITALS — BP 121/51 | HR 71 | Temp 98.3°F | Ht 65.0 in | Wt 192.0 lb

## 2015-12-20 DIAGNOSIS — Z510 Encounter for antineoplastic radiation therapy: Secondary | ICD-10-CM | POA: Diagnosis not present

## 2015-12-20 DIAGNOSIS — D420 Neoplasm of uncertain behavior of cerebral meninges: Secondary | ICD-10-CM | POA: Diagnosis not present

## 2015-12-20 DIAGNOSIS — Z79899 Other long term (current) drug therapy: Secondary | ICD-10-CM | POA: Diagnosis not present

## 2015-12-20 DIAGNOSIS — Z7982 Long term (current) use of aspirin: Secondary | ICD-10-CM | POA: Diagnosis not present

## 2015-12-20 NOTE — Progress Notes (Signed)
Ms. First is here for her 11th fraction of radiation to her Falx Meningioma. She denies pain. She does have some fatigue. The skin to her Radiation site is red. She has not been using the sonafine, but will begin using it as instructed twice daily. She is eating well. She reports no headaches or nausea.  BP (!) 121/51   Pulse 71   Temp 98.3 F (36.8 C)   Ht 5\' 5"  (1.651 m)   Wt 192 lb (87.1 kg)   SpO2 100% Comment: room air  BMI 31.95 kg/m    Wt Readings from Last 3 Encounters:  12/20/15 192 lb (87.1 kg)  12/13/15 191 lb (86.6 kg)  12/06/15 192 lb 9.6 oz (87.4 kg)

## 2015-12-20 NOTE — Progress Notes (Signed)
Weekly Management Note:  outpatient    ICD-9-CM ICD-10-CM   1. Atypical meningioma of brain ([place]) 237.6 D42.0     Current Dose:  19.8 Gy  Projected Dose: 55.8 Gy   Narrative:  The patient presents for routine under treatment assessment.  CBCT/MVCT images/Port film x-rays were reviewed.  The chart was checked.   Julia Poole presents for her 11th fraction of radiation to her falx meningioma. She denies pain, headaches, or nausea. She does have some fatigue.   She has not been using the sonafine, but will begin using it as instructed twice daily. She is eating well.  Physical Findings:  Wt Readings from Last 3 Encounters:  12/20/15 192 lb (87.1 kg)  12/13/15 191 lb (86.6 kg)  12/06/15 192 lb 9.6 oz (87.4 kg)    height is 5\' 5"  (1.651 m) and weight is 192 lb (87.1 kg). Her temperature is 98.3 F (36.8 C). Her blood pressure is 121/51 (abnormal) and her pulse is 71. Her oxygen saturation is 100%.   Slightly erythematous scalp. No oral thrush. Ambulatory.  CBC    Component Value Date/Time   WBC 20.9 (H) 10/17/2015 0352   RBC 3.18 (L) 10/17/2015 0352   HGB 9.7 (L) 10/17/2015 0352   HGB 13.2 01/25/2014 0954   HCT 29.5 (L) 10/17/2015 0352   HCT 39.9 01/25/2014 0954   PLT 307 10/17/2015 0352   PLT 224 01/25/2014 0954   MCV 92.8 10/17/2015 0352   MCV 92.4 01/25/2014 0954   MCH 30.5 10/17/2015 0352   MCHC 32.9 10/17/2015 0352   RDW 12.9 10/17/2015 0352   RDW 12.7 01/25/2014 0954   LYMPHSABS 2.3 10/13/2015 1520   LYMPHSABS 2.4 01/25/2014 0954   MONOABS 0.6 10/13/2015 1520   MONOABS 0.5 01/25/2014 0954   EOSABS 0.2 10/13/2015 1520   EOSABS 0.2 01/25/2014 0954   EOSABS 0.2 11/07/2009 1355   BASOSABS 0.0 10/13/2015 1520   BASOSABS 0.0 01/25/2014 0954     CMP     Component Value Date/Time   NA 141 11/21/2015 1053   NA 142 01/25/2014 0954   K 4.1 11/21/2015 1053   K 3.9 01/25/2014 0954   CL 103 11/21/2015 1053   CL 108 (H) 01/20/2012 0931   CO2 25 11/21/2015 1053   CO2 28  01/25/2014 0954   GLUCOSE 98 11/21/2015 1053   GLUCOSE 168 (H) 10/17/2015 0352   GLUCOSE 104 01/25/2014 0954   GLUCOSE 85 01/20/2012 0931   BUN 18 11/21/2015 1053   BUN 18.0 01/25/2014 0954   CREATININE 0.75 11/21/2015 1053   CREATININE 0.7 01/25/2014 0954   CALCIUM 9.6 11/21/2015 1053   CALCIUM 9.6 01/25/2014 0954   PROT 6.5 11/21/2015 1053   PROT 6.6 01/25/2014 0954   ALBUMIN 4.3 11/21/2015 1053   ALBUMIN 3.7 01/25/2014 0954   AST 29 11/21/2015 1053   AST 33 01/25/2014 0954   ALT 26 11/21/2015 1053   ALT 37 01/25/2014 0954   ALKPHOS 96 11/21/2015 1053   ALKPHOS 66 01/25/2014 0954   BILITOT 0.5 11/21/2015 1053   BILITOT 0.62 01/25/2014 0954   GFRNONAA 86 11/21/2015 1053   GFRNONAA >89 08/20/2012 1038   GFRAA 99 11/21/2015 1053   GFRAA >89 08/20/2012 1038    Impression:  The patient is tolerating radiotherapy.   Plan:  Continue radiotherapy as planned.  -----------------------------------  Eppie Gibson, MD  This document serves as a record of services personally performed by Eppie Gibson, MD. It was created on her behalf by  Darcus Austin, a trained medical scribe. The creation of this record is based on the scribe's personal observations and the provider's statements to them. This document has been checked and approved by the attending provider.

## 2015-12-21 ENCOUNTER — Ambulatory Visit
Admission: RE | Admit: 2015-12-21 | Discharge: 2015-12-21 | Disposition: A | Discharge status: Home / Self Care | Payer: Medicare Other | Source: Ambulatory Visit | Attending: Radiation Oncology | Admitting: Radiation Oncology

## 2015-12-21 DIAGNOSIS — Z79899 Other long term (current) drug therapy: Secondary | ICD-10-CM | POA: Diagnosis not present

## 2015-12-21 DIAGNOSIS — Z510 Encounter for antineoplastic radiation therapy: Secondary | ICD-10-CM | POA: Diagnosis not present

## 2015-12-21 DIAGNOSIS — Z7982 Long term (current) use of aspirin: Secondary | ICD-10-CM | POA: Diagnosis not present

## 2015-12-21 DIAGNOSIS — D420 Neoplasm of uncertain behavior of cerebral meninges: Secondary | ICD-10-CM | POA: Diagnosis not present

## 2015-12-22 ENCOUNTER — Ambulatory Visit
Admission: RE | Admit: 2015-12-22 | Discharge: 2015-12-22 | Disposition: A | Discharge status: Home / Self Care | Payer: Medicare Other | Source: Ambulatory Visit | Attending: Radiation Oncology | Admitting: Radiation Oncology

## 2015-12-22 DIAGNOSIS — D420 Neoplasm of uncertain behavior of cerebral meninges: Secondary | ICD-10-CM | POA: Diagnosis not present

## 2015-12-22 DIAGNOSIS — Z510 Encounter for antineoplastic radiation therapy: Secondary | ICD-10-CM | POA: Diagnosis not present

## 2015-12-22 DIAGNOSIS — Z79899 Other long term (current) drug therapy: Secondary | ICD-10-CM | POA: Diagnosis not present

## 2015-12-22 DIAGNOSIS — Z7982 Long term (current) use of aspirin: Secondary | ICD-10-CM | POA: Diagnosis not present

## 2015-12-25 ENCOUNTER — Ambulatory Visit
Admission: RE | Admit: 2015-12-25 | Discharge: 2015-12-25 | Disposition: A | Discharge status: Home / Self Care | Payer: Medicare Other | Source: Ambulatory Visit | Attending: Radiation Oncology | Admitting: Radiation Oncology

## 2015-12-25 ENCOUNTER — Encounter: Payer: Self-pay | Admitting: Radiation Oncology

## 2015-12-25 VITALS — BP 118/61 | HR 79 | Temp 98.2°F | Ht 65.0 in | Wt 193.3 lb

## 2015-12-25 DIAGNOSIS — Z79899 Other long term (current) drug therapy: Secondary | ICD-10-CM | POA: Diagnosis not present

## 2015-12-25 DIAGNOSIS — M4310 Spondylolisthesis, site unspecified: Secondary | ICD-10-CM | POA: Diagnosis not present

## 2015-12-25 DIAGNOSIS — Z510 Encounter for antineoplastic radiation therapy: Secondary | ICD-10-CM | POA: Diagnosis not present

## 2015-12-25 DIAGNOSIS — D420 Neoplasm of uncertain behavior of cerebral meninges: Secondary | ICD-10-CM | POA: Diagnosis not present

## 2015-12-25 DIAGNOSIS — M545 Low back pain: Secondary | ICD-10-CM | POA: Diagnosis not present

## 2015-12-25 DIAGNOSIS — Z7982 Long term (current) use of aspirin: Secondary | ICD-10-CM | POA: Diagnosis not present

## 2015-12-25 NOTE — Progress Notes (Signed)
Julia Poole is here for her 14th fraction of radiation to her Falx Meningioma. She denies pain. She does have some fatigue. She is eating well. Her skin to her incision is red, and she is using the sonafine twice daily. She has a scab present to her incision site which is intact.   BP 118/61   Pulse 79   Temp 98.2 F (36.8 C)   Ht 5\' 5"  (1.651 m)   Wt 193 lb 4.8 oz (87.7 kg)   SpO2 100% Comment: room air  BMI 32.17 kg/m    Wt Readings from Last 3 Encounters:  12/25/15 193 lb 4.8 oz (87.7 kg)  12/20/15 192 lb (87.1 kg)  12/13/15 191 lb (86.6 kg)

## 2015-12-25 NOTE — Progress Notes (Signed)
   Weekly Management Note:  outpatient    ICD-9-CM ICD-10-CM   1. Atypical meningioma of brain ([place]) 237.6 D42.0     Current Dose:  25.2 Gy  Projected Dose: 55.8 Gy   Narrative:  The patient presents for routine under treatment assessment.  CBCT/MVCT images/Port film x-rays were reviewed.  The chart was checked.  Some fatigue, no pain. Physical Findings:  Wt Readings from Last 3 Encounters:  12/25/15 193 lb 4.8 oz (87.7 kg)  12/20/15 192 lb (87.1 kg)  12/13/15 191 lb (86.6 kg)    height is 5\' 5"  (1.651 m) and weight is 193 lb 4.8 oz (87.7 kg). Her temperature is 98.2 F (36.8 C). Her blood pressure is 118/61 and her pulse is 79. Her oxygen saturation is 100%.   Slightly erythematous scalp.  Ambulatory.  CBC    Component Value Date/Time   WBC 20.9 (H) 10/17/2015 0352   RBC 3.18 (L) 10/17/2015 0352   HGB 9.7 (L) 10/17/2015 0352   HGB 13.2 01/25/2014 0954   HCT 29.5 (L) 10/17/2015 0352   HCT 39.9 01/25/2014 0954   PLT 307 10/17/2015 0352   PLT 224 01/25/2014 0954   MCV 92.8 10/17/2015 0352   MCV 92.4 01/25/2014 0954   MCH 30.5 10/17/2015 0352   MCHC 32.9 10/17/2015 0352   RDW 12.9 10/17/2015 0352   RDW 12.7 01/25/2014 0954   LYMPHSABS 2.3 10/13/2015 1520   LYMPHSABS 2.4 01/25/2014 0954   MONOABS 0.6 10/13/2015 1520   MONOABS 0.5 01/25/2014 0954   EOSABS 0.2 10/13/2015 1520   EOSABS 0.2 01/25/2014 0954   EOSABS 0.2 11/07/2009 1355   BASOSABS 0.0 10/13/2015 1520   BASOSABS 0.0 01/25/2014 0954     CMP     Component Value Date/Time   NA 141 11/21/2015 1053   NA 142 01/25/2014 0954   K 4.1 11/21/2015 1053   K 3.9 01/25/2014 0954   CL 103 11/21/2015 1053   CL 108 (H) 01/20/2012 0931   CO2 25 11/21/2015 1053   CO2 28 01/25/2014 0954   GLUCOSE 98 11/21/2015 1053   GLUCOSE 168 (H) 10/17/2015 0352   GLUCOSE 104 01/25/2014 0954   GLUCOSE 85 01/20/2012 0931   BUN 18 11/21/2015 1053   BUN 18.0 01/25/2014 0954   CREATININE 0.75 11/21/2015 1053   CREATININE 0.7  01/25/2014 0954   CALCIUM 9.6 11/21/2015 1053   CALCIUM 9.6 01/25/2014 0954   PROT 6.5 11/21/2015 1053   PROT 6.6 01/25/2014 0954   ALBUMIN 4.3 11/21/2015 1053   ALBUMIN 3.7 01/25/2014 0954   AST 29 11/21/2015 1053   AST 33 01/25/2014 0954   ALT 26 11/21/2015 1053   ALT 37 01/25/2014 0954   ALKPHOS 96 11/21/2015 1053   ALKPHOS 66 01/25/2014 0954   BILITOT 0.5 11/21/2015 1053   BILITOT 0.62 01/25/2014 0954   GFRNONAA 86 11/21/2015 1053   GFRNONAA >89 08/20/2012 1038   GFRAA 99 11/21/2015 1053   GFRAA >89 08/20/2012 1038    Impression:  The patient is tolerating radiotherapy.   Plan:  Continue radiotherapy as planned.  -----------------------------------  Eppie Gibson, MD

## 2015-12-26 ENCOUNTER — Ambulatory Visit
Admission: RE | Admit: 2015-12-26 | Discharge: 2015-12-26 | Disposition: A | Discharge status: Home / Self Care | Payer: Medicare Other | Source: Ambulatory Visit | Attending: Radiation Oncology | Admitting: Radiation Oncology

## 2015-12-26 DIAGNOSIS — Z79899 Other long term (current) drug therapy: Secondary | ICD-10-CM | POA: Diagnosis not present

## 2015-12-26 DIAGNOSIS — D420 Neoplasm of uncertain behavior of cerebral meninges: Secondary | ICD-10-CM | POA: Diagnosis not present

## 2015-12-26 DIAGNOSIS — Z510 Encounter for antineoplastic radiation therapy: Secondary | ICD-10-CM | POA: Diagnosis not present

## 2015-12-26 DIAGNOSIS — Z7982 Long term (current) use of aspirin: Secondary | ICD-10-CM | POA: Diagnosis not present

## 2015-12-27 ENCOUNTER — Ambulatory Visit
Admission: RE | Admit: 2015-12-27 | Discharge: 2015-12-27 | Disposition: A | Discharge status: Home / Self Care | Payer: Medicare Other | Source: Ambulatory Visit | Attending: Radiation Oncology | Admitting: Radiation Oncology

## 2015-12-27 ENCOUNTER — Other Ambulatory Visit: Payer: Medicare Other

## 2015-12-27 ENCOUNTER — Ambulatory Visit (HOSPITAL_BASED_OUTPATIENT_CLINIC_OR_DEPARTMENT_OTHER): Payer: Medicare Other | Admitting: Genetic Counselor

## 2015-12-27 ENCOUNTER — Encounter: Payer: Self-pay | Admitting: Genetic Counselor

## 2015-12-27 DIAGNOSIS — Z808 Family history of malignant neoplasm of other organs or systems: Secondary | ICD-10-CM

## 2015-12-27 DIAGNOSIS — Z800 Family history of malignant neoplasm of digestive organs: Secondary | ICD-10-CM | POA: Diagnosis not present

## 2015-12-27 DIAGNOSIS — D420 Neoplasm of uncertain behavior of cerebral meninges: Secondary | ICD-10-CM

## 2015-12-27 DIAGNOSIS — Z803 Family history of malignant neoplasm of breast: Secondary | ICD-10-CM | POA: Insufficient documentation

## 2015-12-27 DIAGNOSIS — C50911 Malignant neoplasm of unspecified site of right female breast: Secondary | ICD-10-CM

## 2015-12-27 DIAGNOSIS — Z510 Encounter for antineoplastic radiation therapy: Secondary | ICD-10-CM | POA: Diagnosis not present

## 2015-12-27 DIAGNOSIS — Z8041 Family history of malignant neoplasm of ovary: Secondary | ICD-10-CM | POA: Diagnosis not present

## 2015-12-27 DIAGNOSIS — Z79899 Other long term (current) drug therapy: Secondary | ICD-10-CM | POA: Diagnosis not present

## 2015-12-27 DIAGNOSIS — Z7982 Long term (current) use of aspirin: Secondary | ICD-10-CM | POA: Diagnosis not present

## 2015-12-27 NOTE — Progress Notes (Signed)
REFERRING PROVIDER: Chevis Pretty, FNP [place], [place] [address]   Julia Gibson, MD  PRIMARY PROVIDER:  Chevis Pretty, FNP  PRIMARY REASON FOR VISIT:  1. Malignant neoplasm of right female breast, unspecified site of breast ([place])   2. Family history of breast cancer in female   3. Family history of colon cancer   4. Family history of ovarian cancer   5. Atypical meningioma of brain ([place])      HISTORY OF PRESENT ILLNESS:   Julia Poole, a 60 y.o. female, was seen for a  cancer genetics consultation at the request of Dr. Isidore Moos due to a personal and family history of cancer.  Julia Poole presents to clinic today to discuss the possibility of a hereditary predisposition to cancer, genetic testing, and to further clarify her future cancer risks, as well as potential cancer risks for family members.   In 2010, at the age of 60, Julia Poole was diagnosed with DCIS of the right breast. This was treated with lumpectomy and XRT.  She was taking Tamoxifen.    CANCER HISTORY:   No history exists.     HORMONAL RISK FACTORS:  Menarche was at age 77.  First live birth at age 8.  OCP use for approximately 10-12 years.  Ovaries intact: yes.  Hysterectomy: no.  Menopausal status: postmenopausal.  HRT use: 0 years. Colonoscopy: yes; normal. Mammogram within the last year: yes. Number of breast biopsies: 1. Up to date with pelvic exams:  yes. Any excessive radiation exposure in the past:  no  Past Medical History:  Diagnosis Date  . Allergy   . Arthritis   . Bleeding nose   . Cancer [facility])    right breast  . Cancer ([place]) 09/2015   brain  . Degenerative disk disease 2012  . Family history of breast cancer in female   . Family history of colon cancer   . Family history of ovarian cancer   . Hemorrhoids   . Hyperlipidemia   . Sinus problem   . Thyroid disease     Past Surgical History:  Procedure Laterality Date  . BREAST SURGERY  AUG. 2010    right breast lumpectomy  . BREAST SURGERY     Cancer  . COLONOSCOPY    . CRANIOTOMY Left 10/16/2015   Procedure: PARA-SAGITTAL CRANIOTOMY WITH STEREOTACTIC NAVIGATION FOR TUMOR;  Surgeon: Kevan Ny Ditty, MD;  Location: [facility];  Service: Neurosurgery;  Laterality: Left;  . GANGLION CYST EXCISION  SEP. 1997   RIGHT WRIST    Social History   Social History  . Marital status: Married    Spouse name: N/A  . Number of children: 1  . Years of education: N/A   Social History Main Topics  . Smoking status: Never Smoker  . Smokeless tobacco: Never Used  . Alcohol use No  . Drug use: No  . Sexual activity: Yes    Birth control/ protection: Post-menopausal   Other Topics Concern  . None   Social History Narrative  . None     FAMILY HISTORY:  We obtained a detailed, 4-generation family history.  Significant diagnoses are listed below: Family History  Problem Relation Age of Onset  . Colon cancer Mother 45  . Diabetes Father   . Heart disease Father   . Cancer Father     skin  . Breast cancer Brother 76  . Cancer Brother     bone mets  . Leukemia Sister 51  CLL  . Cancer Brother     skin  . Ovarian cancer Maternal Aunt     dx in her 49s  . Tuberculosis Maternal Grandmother   . Heart disease Maternal Grandfather   . Diabetes Paternal Grandmother   . Alcohol abuse Paternal Grandfather     The patient has one son who is cancer free.  She ha one sister and two full brothers and a paternal half brother.  Her sister died of CLL at 7, one full brother was diagnosed with breast cancer after a car accident at 94, and a full brother was diagnosed with skin cancer.  The patient's mother was diagnosed with colon cancer at 46 and died at 65.  Her father had several skin cancers.  The patient's mother had a twin sister with ovarian cancer.  There is no other reported family history of cancer.  Patient's maternal ancestors are of [place] descent, and paternal ancestors are of  [place] descent. There is no reported Ashkenazi Jewish ancestry. There is no known consanguinity.  GENETIC COUNSELING ASSESSMENT: Julia Poole is a 60 y.o. female with a personal history of breast cancer and meningioma and family history of breast, ovarian, colon and skin cancer which is somewhat suggestive of a hereditary cancer syndrome and predisposition to cancer. We, therefore, discussed and recommended the following at today's visit.   DISCUSSION: We discussed that about 5-10% of breast cancer is hereditary with most cases due to BRCA mutations. Other genes associated with hereditary breast cancer syndromes include PALB2, ATM and CHEK2.  There are a few genes that can be associated with inherited meningioma syndromes.  The patient was diagnosed with four meningiomas. We reviewed the characteristics, features and inheritance patterns of hereditary cancer syndromes. We also discussed genetic testing, including the appropriate family members to test, the process of testing, insurance coverage and turn-around-time for results. We discussed the implications of a negative, positive and/or variant of uncertain significant result. We recommended Julia Poole pursue genetic testing for the 80 gene [facility] gene panel. The Multi-Gene Panel offered by [facility] includes sequencing and/or deletion duplication testing of the following 80 genes: ALK, APC, ATM, AXIN2,BAP1,  BARD1, BLM, BMPR1A, BRCA1, BRCA2, BRIP1, CASR, CDC73, CDH1, CDK4, CDKN1B, CDKN1C, CDKN2A (p14ARF), CDKN2A (p16INK4a), CEBPA, CHEK2, DICER1, CIS3L2, EGFR (c.2369C>T, p.Thr790Met variant only), EPCAM (Deletion/duplication testing only), FH, FLCN, GATA2, GPC3, GREM1 (Promoter region deletion/duplication testing only), HOXB13 (c.251G>A, p.Gly84Glu), HRAS, KIT, MAX, MEN1, MET, MITF (c.952G>A, p.Glu318Lys variant only), MLH1, MSH2, MSH6, MUTYH, NBN, NF1, NF2, PALB2, PDGFRA, PHOX2B, PMS2, POLD1, POLE, POT1, PRKAR1A, PTCH1, PTEN, RAD50, RAD51C, RAD51D, RB1, RECQL4,  RET, RUNX1, SDHAF2, SDHA (sequence changes only), SDHB, SDHC, SDHD, SMAD4, SMARCA4, SMARCB1, SMARCE1, STK11, SUFU, TERT, TERT, TMEM127, TP53, TSC1, TSC2, VHL, WRN and WT1.    Based on Julia Poole personal and family history of cancer, she meets medical criteria for genetic testing. Despite that she meets criteria, she may still have an out of pocket cost. We discussed that if her out of pocket cost for testing is over $100, the laboratory will call and confirm whether she wants to proceed with testing.  If the out of pocket cost of testing is less than $100 she will be billed by the genetic testing laboratory.   PLAN: After considering the risks, benefits, and limitations, Julia Poole  provided informed consent to pursue genetic testing and the blood sample was sent to [facility] for analysis of the [facility] panel. Results should be available within approximately 2-3 weeks' time, at  which point they will be disclosed by telephone to Julia Poole, as will any additional recommendations warranted by these results. Julia Poole will receive a summary of her genetic counseling visit and a copy of her results once available. This information will also be available in Epic. We encouraged Julia Poole to remain in contact with cancer genetics annually so that we can continuously update the family history and inform her of any changes in cancer genetics and testing that may be of benefit for her family. Julia Poole questions were answered to her satisfaction today. Our contact information was provided should additional questions or concerns arise.  Lastly, we encouraged Julia Poole to remain in contact with cancer genetics annually so that we can continuously update the family history and inform her of any changes in cancer genetics and testing that may be of benefit for this family.   Ms.  Poole questions were answered to her satisfaction today. Our contact information was provided should additional questions or concerns  arise. Thank you for the referral and allowing [place] to share in the care of your patient.   Julia Poole P. Florene Glen, [place], [facility] Certified Genetic Counselor [email or web address] phone: [phone]  The patient was seen for a total of 60 minutes in face-to-face genetic counseling.  This patient was discussed with Drs. Magrinat, Lindi Adie and/or Burr Medico who agrees with the above.    _______________________________________________________________________ For Office Staff:  Number of people involved in session: 2 Was an Intern/ student involved with case: no

## 2015-12-28 ENCOUNTER — Ambulatory Visit
Admission: RE | Admit: 2015-12-28 | Discharge: 2015-12-28 | Disposition: A | Discharge status: Home / Self Care | Payer: Medicare Other | Source: Ambulatory Visit | Attending: Radiation Oncology | Admitting: Radiation Oncology

## 2015-12-28 DIAGNOSIS — D420 Neoplasm of uncertain behavior of cerebral meninges: Secondary | ICD-10-CM | POA: Diagnosis not present

## 2015-12-28 DIAGNOSIS — Z79899 Other long term (current) drug therapy: Secondary | ICD-10-CM | POA: Diagnosis not present

## 2015-12-28 DIAGNOSIS — Z7982 Long term (current) use of aspirin: Secondary | ICD-10-CM | POA: Diagnosis not present

## 2015-12-28 DIAGNOSIS — Z510 Encounter for antineoplastic radiation therapy: Secondary | ICD-10-CM | POA: Diagnosis not present

## 2015-12-29 ENCOUNTER — Encounter (HOSPITAL_COMMUNITY): Payer: Self-pay | Admitting: Neurological Surgery

## 2015-12-29 ENCOUNTER — Ambulatory Visit
Admission: RE | Admit: 2015-12-29 | Discharge: 2015-12-29 | Disposition: A | Discharge status: Home / Self Care | Payer: Medicare Other | Source: Ambulatory Visit | Attending: Radiation Oncology | Admitting: Radiation Oncology

## 2015-12-29 DIAGNOSIS — Z7982 Long term (current) use of aspirin: Secondary | ICD-10-CM | POA: Diagnosis not present

## 2015-12-29 DIAGNOSIS — Z510 Encounter for antineoplastic radiation therapy: Secondary | ICD-10-CM | POA: Diagnosis not present

## 2015-12-29 DIAGNOSIS — Z79899 Other long term (current) drug therapy: Secondary | ICD-10-CM | POA: Diagnosis not present

## 2015-12-29 DIAGNOSIS — D420 Neoplasm of uncertain behavior of cerebral meninges: Secondary | ICD-10-CM | POA: Diagnosis not present

## 2016-01-01 ENCOUNTER — Encounter: Payer: Self-pay | Admitting: Radiation Oncology

## 2016-01-01 ENCOUNTER — Ambulatory Visit
Admission: RE | Admit: 2016-01-01 | Discharge: 2016-01-01 | Disposition: A | Discharge status: Home / Self Care | Payer: Medicare Other | Source: Ambulatory Visit | Attending: Radiation Oncology | Admitting: Radiation Oncology

## 2016-01-01 VITALS — BP 125/60 | HR 81 | Temp 98.3°F | Ht 65.0 in | Wt 192.6 lb

## 2016-01-01 DIAGNOSIS — Z7982 Long term (current) use of aspirin: Secondary | ICD-10-CM | POA: Diagnosis not present

## 2016-01-01 DIAGNOSIS — Z510 Encounter for antineoplastic radiation therapy: Secondary | ICD-10-CM | POA: Diagnosis not present

## 2016-01-01 DIAGNOSIS — Z79899 Other long term (current) drug therapy: Secondary | ICD-10-CM | POA: Diagnosis not present

## 2016-01-01 DIAGNOSIS — D420 Neoplasm of uncertain behavior of cerebral meninges: Secondary | ICD-10-CM | POA: Diagnosis not present

## 2016-01-01 NOTE — Progress Notes (Signed)
   Weekly Management Note:  outpatient    ICD-9-CM ICD-10-CM   1. Atypical meningioma of brain ([place]) 237.6 D42.0     Current Dose:  34.2 Gy  Projected Dose: 55.8 Gy   Narrative:  The patient presents for routine under treatment assessment.  CBCT/MVCT images/Port film x-rays were reviewed.  The chart was checked.  Some fatigue, no pain. Noted hair loss over vertex of scalp Physical Findings:  Wt Readings from Last 3 Encounters:  01/01/16 192 lb 9.6 oz (87.4 kg)  12/25/15 193 lb 4.8 oz (87.7 kg)  12/20/15 192 lb (87.1 kg)    height is 5\' 5"  (1.651 m) and weight is 192 lb 9.6 oz (87.4 kg). Her temperature is 98.3 F (36.8 C). Her blood pressure is 125/60 and her pulse is 81. Her oxygen saturation is 100%.   Slightly erythematous scalp/alopecia centrally.  Ambulatory.  CBC    Component Value Date/Time   WBC 20.9 (H) 10/17/2015 0352   RBC 3.18 (L) 10/17/2015 0352   HGB 9.7 (L) 10/17/2015 0352   HGB 13.2 01/25/2014 0954   HCT 29.5 (L) 10/17/2015 0352   HCT 39.9 01/25/2014 0954   PLT 307 10/17/2015 0352   PLT 224 01/25/2014 0954   MCV 92.8 10/17/2015 0352   MCV 92.4 01/25/2014 0954   MCH 30.5 10/17/2015 0352   MCHC 32.9 10/17/2015 0352   RDW 12.9 10/17/2015 0352   RDW 12.7 01/25/2014 0954   LYMPHSABS 2.3 10/13/2015 1520   LYMPHSABS 2.4 01/25/2014 0954   MONOABS 0.6 10/13/2015 1520   MONOABS 0.5 01/25/2014 0954   EOSABS 0.2 10/13/2015 1520   EOSABS 0.2 01/25/2014 0954   EOSABS 0.2 11/07/2009 1355   BASOSABS 0.0 10/13/2015 1520   BASOSABS 0.0 01/25/2014 0954     CMP     Component Value Date/Time   NA 141 11/21/2015 1053   NA 142 01/25/2014 0954   K 4.1 11/21/2015 1053   K 3.9 01/25/2014 0954   CL 103 11/21/2015 1053   CL 108 (H) 01/20/2012 0931   CO2 25 11/21/2015 1053   CO2 28 01/25/2014 0954   GLUCOSE 98 11/21/2015 1053   GLUCOSE 168 (H) 10/17/2015 0352   GLUCOSE 104 01/25/2014 0954   GLUCOSE 85 01/20/2012 0931   BUN 18 11/21/2015 1053   BUN 18.0 01/25/2014  0954   CREATININE 0.75 11/21/2015 1053   CREATININE 0.7 01/25/2014 0954   CALCIUM 9.6 11/21/2015 1053   CALCIUM 9.6 01/25/2014 0954   PROT 6.5 11/21/2015 1053   PROT 6.6 01/25/2014 0954   ALBUMIN 4.3 11/21/2015 1053   ALBUMIN 3.7 01/25/2014 0954   AST 29 11/21/2015 1053   AST 33 01/25/2014 0954   ALT 26 11/21/2015 1053   ALT 37 01/25/2014 0954   ALKPHOS 96 11/21/2015 1053   ALKPHOS 66 01/25/2014 0954   BILITOT 0.5 11/21/2015 1053   BILITOT 0.62 01/25/2014 0954   GFRNONAA 86 11/21/2015 1053   GFRNONAA >89 08/20/2012 1038   GFRAA 99 11/21/2015 1053   GFRAA >89 08/20/2012 1038    Impression:  The patient is tolerating radiotherapy.   Plan:  Continue radiotherapy as planned.  -----------------------------------  Eppie Gibson, MD

## 2016-01-01 NOTE — Progress Notes (Signed)
Ms. Julia Poole is here for her 19th fraction of radiation to her Falx Meningioma. She denies pain. She does report fatigue. She reports nausea after her treatments at the end of last week. It occurred also after eating fried food, and some sweets. She has lost some hair to the top of her head. She is using the sonafine cream to her radiation site. She does report some itching to this area also which the sonafine relieves.   BP 125/60   Pulse 81   Temp 98.3 F (36.8 C)   Ht 5\' 5"  (1.651 m)   Wt 192 lb 9.6 oz (87.4 kg)   SpO2 100% Comment: room air  BMI 32.05 kg/m    Wt Readings from Last 3 Encounters:  01/01/16 192 lb 9.6 oz (87.4 kg)  12/25/15 193 lb 4.8 oz (87.7 kg)  12/20/15 192 lb (87.1 kg)

## 2016-01-02 ENCOUNTER — Ambulatory Visit
Admission: RE | Admit: 2016-01-02 | Discharge: 2016-01-02 | Disposition: A | Discharge status: Home / Self Care | Payer: Medicare Other | Source: Ambulatory Visit | Attending: Radiation Oncology | Admitting: Radiation Oncology

## 2016-01-02 DIAGNOSIS — D420 Neoplasm of uncertain behavior of cerebral meninges: Secondary | ICD-10-CM | POA: Diagnosis not present

## 2016-01-02 DIAGNOSIS — Z79899 Other long term (current) drug therapy: Secondary | ICD-10-CM | POA: Diagnosis not present

## 2016-01-02 DIAGNOSIS — Z510 Encounter for antineoplastic radiation therapy: Secondary | ICD-10-CM | POA: Diagnosis not present

## 2016-01-02 DIAGNOSIS — Z7982 Long term (current) use of aspirin: Secondary | ICD-10-CM | POA: Diagnosis not present

## 2016-01-03 ENCOUNTER — Ambulatory Visit
Admission: RE | Admit: 2016-01-03 | Discharge: 2016-01-03 | Disposition: A | Discharge status: Home / Self Care | Payer: Medicare Other | Source: Ambulatory Visit | Attending: Radiation Oncology | Admitting: Radiation Oncology

## 2016-01-03 DIAGNOSIS — D420 Neoplasm of uncertain behavior of cerebral meninges: Secondary | ICD-10-CM | POA: Diagnosis not present

## 2016-01-03 DIAGNOSIS — Z7982 Long term (current) use of aspirin: Secondary | ICD-10-CM | POA: Diagnosis not present

## 2016-01-03 DIAGNOSIS — Z79899 Other long term (current) drug therapy: Secondary | ICD-10-CM | POA: Diagnosis not present

## 2016-01-03 DIAGNOSIS — Z510 Encounter for antineoplastic radiation therapy: Secondary | ICD-10-CM | POA: Diagnosis not present

## 2016-01-04 ENCOUNTER — Ambulatory Visit
Admission: RE | Admit: 2016-01-04 | Discharge: 2016-01-04 | Disposition: A | Discharge status: Home / Self Care | Payer: Medicare Other | Source: Ambulatory Visit | Attending: Radiation Oncology | Admitting: Radiation Oncology

## 2016-01-04 DIAGNOSIS — Z79899 Other long term (current) drug therapy: Secondary | ICD-10-CM | POA: Diagnosis not present

## 2016-01-04 DIAGNOSIS — D420 Neoplasm of uncertain behavior of cerebral meninges: Secondary | ICD-10-CM | POA: Diagnosis not present

## 2016-01-04 DIAGNOSIS — Z7982 Long term (current) use of aspirin: Secondary | ICD-10-CM | POA: Diagnosis not present

## 2016-01-04 DIAGNOSIS — Z510 Encounter for antineoplastic radiation therapy: Secondary | ICD-10-CM | POA: Diagnosis not present

## 2016-01-05 ENCOUNTER — Ambulatory Visit
Admission: RE | Admit: 2016-01-05 | Discharge: 2016-01-05 | Disposition: A | Discharge status: Home / Self Care | Payer: Medicare Other | Source: Ambulatory Visit | Attending: Radiation Oncology | Admitting: Radiation Oncology

## 2016-01-05 DIAGNOSIS — Z7982 Long term (current) use of aspirin: Secondary | ICD-10-CM | POA: Diagnosis not present

## 2016-01-05 DIAGNOSIS — Z510 Encounter for antineoplastic radiation therapy: Secondary | ICD-10-CM | POA: Diagnosis not present

## 2016-01-05 DIAGNOSIS — Z79899 Other long term (current) drug therapy: Secondary | ICD-10-CM | POA: Diagnosis not present

## 2016-01-05 DIAGNOSIS — D420 Neoplasm of uncertain behavior of cerebral meninges: Secondary | ICD-10-CM | POA: Diagnosis not present

## 2016-01-08 ENCOUNTER — Ambulatory Visit
Admission: RE | Admit: 2016-01-08 | Discharge: 2016-01-08 | Disposition: A | Discharge status: Home / Self Care | Payer: Medicare Other | Source: Ambulatory Visit | Attending: Radiation Oncology | Admitting: Radiation Oncology

## 2016-01-08 ENCOUNTER — Encounter: Payer: Self-pay | Admitting: Radiation Oncology

## 2016-01-08 VITALS — BP 121/61 | HR 75 | Temp 98.3°F | Ht 65.0 in | Wt 193.0 lb

## 2016-01-08 DIAGNOSIS — Z79899 Other long term (current) drug therapy: Secondary | ICD-10-CM | POA: Diagnosis not present

## 2016-01-08 DIAGNOSIS — Z7982 Long term (current) use of aspirin: Secondary | ICD-10-CM | POA: Diagnosis not present

## 2016-01-08 DIAGNOSIS — Z510 Encounter for antineoplastic radiation therapy: Secondary | ICD-10-CM | POA: Diagnosis not present

## 2016-01-08 DIAGNOSIS — D420 Neoplasm of uncertain behavior of cerebral meninges: Secondary | ICD-10-CM | POA: Diagnosis not present

## 2016-01-08 NOTE — Progress Notes (Signed)
Julia Poole is here for her 24th fraction of radiation to her Falx Meningioma. She denies pain and fatigue. She is eating well per her report. The skin to her radiation site is slightly red, she reports some mild itching at times, which sonafine helps with. She is using the sonafine twice daily.   BP 121/61   Pulse 75   Temp 98.3 F (36.8 C)   Ht 5\' 5"  (1.651 m)   Wt 193 lb (87.5 kg)   SpO2 98% Comment: room air  BMI 32.12 kg/m    Wt Readings from Last 3 Encounters:  01/08/16 193 lb (87.5 kg)  01/01/16 192 lb 9.6 oz (87.4 kg)  12/25/15 193 lb 4.8 oz (87.7 kg)

## 2016-01-08 NOTE — Progress Notes (Signed)
   Weekly Management Note:  outpatient    ICD-9-CM ICD-10-CM   1. Atypical meningioma of brain ([place]) 237.6 D42.0     Current Dose:  43.2 Gy  Projected Dose: 55.8 Gy   Narrative:  The patient presents for routine under treatment assessment.  CBCT/MVCT images/Port film x-rays were reviewed.  The chart was checked.   No new issues other than additional hair loss over vertex of scalp Physical Findings:  Wt Readings from Last 3 Encounters:  01/08/16 193 lb (87.5 kg)  01/01/16 192 lb 9.6 oz (87.4 kg)  12/25/15 193 lb 4.8 oz (87.7 kg)    height is 5\' 5"  (1.651 m) and weight is 193 lb (87.5 kg). Her temperature is 98.3 F (36.8 C). Her blood pressure is 121/61 and her pulse is 75. Her oxygen saturation is 98%.   Slightly erythematous scalp/ more alopecia centrally.  Ambulatory.  CBC    Component Value Date/Time   WBC 20.9 (H) 10/17/2015 0352   RBC 3.18 (L) 10/17/2015 0352   HGB 9.7 (L) 10/17/2015 0352   HGB 13.2 01/25/2014 0954   HCT 29.5 (L) 10/17/2015 0352   HCT 39.9 01/25/2014 0954   PLT 307 10/17/2015 0352   PLT 224 01/25/2014 0954   MCV 92.8 10/17/2015 0352   MCV 92.4 01/25/2014 0954   MCH 30.5 10/17/2015 0352   MCHC 32.9 10/17/2015 0352   RDW 12.9 10/17/2015 0352   RDW 12.7 01/25/2014 0954   LYMPHSABS 2.3 10/13/2015 1520   LYMPHSABS 2.4 01/25/2014 0954   MONOABS 0.6 10/13/2015 1520   MONOABS 0.5 01/25/2014 0954   EOSABS 0.2 10/13/2015 1520   EOSABS 0.2 01/25/2014 0954   EOSABS 0.2 11/07/2009 1355   BASOSABS 0.0 10/13/2015 1520   BASOSABS 0.0 01/25/2014 0954     CMP     Component Value Date/Time   NA 141 11/21/2015 1053   NA 142 01/25/2014 0954   K 4.1 11/21/2015 1053   K 3.9 01/25/2014 0954   CL 103 11/21/2015 1053   CL 108 (H) 01/20/2012 0931   CO2 25 11/21/2015 1053   CO2 28 01/25/2014 0954   GLUCOSE 98 11/21/2015 1053   GLUCOSE 168 (H) 10/17/2015 0352   GLUCOSE 104 01/25/2014 0954   GLUCOSE 85 01/20/2012 0931   BUN 18 11/21/2015 1053   BUN 18.0  01/25/2014 0954   CREATININE 0.75 11/21/2015 1053   CREATININE 0.7 01/25/2014 0954   CALCIUM 9.6 11/21/2015 1053   CALCIUM 9.6 01/25/2014 0954   PROT 6.5 11/21/2015 1053   PROT 6.6 01/25/2014 0954   ALBUMIN 4.3 11/21/2015 1053   ALBUMIN 3.7 01/25/2014 0954   AST 29 11/21/2015 1053   AST 33 01/25/2014 0954   ALT 26 11/21/2015 1053   ALT 37 01/25/2014 0954   ALKPHOS 96 11/21/2015 1053   ALKPHOS 66 01/25/2014 0954   BILITOT 0.5 11/21/2015 1053   BILITOT 0.62 01/25/2014 0954   GFRNONAA 86 11/21/2015 1053   GFRNONAA >89 08/20/2012 1038   GFRAA 99 11/21/2015 1053   GFRAA >89 08/20/2012 1038    Impression:  The patient is tolerating radiotherapy.   Plan:  Continue radiotherapy as planned.  -----------------------------------  Eppie Gibson, MD

## 2016-01-09 ENCOUNTER — Ambulatory Visit
Admission: RE | Admit: 2016-01-09 | Discharge: 2016-01-09 | Disposition: A | Discharge status: Home / Self Care | Payer: Medicare Other | Source: Ambulatory Visit | Attending: Radiation Oncology | Admitting: Radiation Oncology

## 2016-01-09 DIAGNOSIS — Z7982 Long term (current) use of aspirin: Secondary | ICD-10-CM | POA: Diagnosis not present

## 2016-01-09 DIAGNOSIS — Z79899 Other long term (current) drug therapy: Secondary | ICD-10-CM | POA: Diagnosis not present

## 2016-01-09 DIAGNOSIS — Z510 Encounter for antineoplastic radiation therapy: Secondary | ICD-10-CM | POA: Diagnosis not present

## 2016-01-09 DIAGNOSIS — D420 Neoplasm of uncertain behavior of cerebral meninges: Secondary | ICD-10-CM | POA: Diagnosis not present

## 2016-01-10 ENCOUNTER — Ambulatory Visit
Admission: RE | Admit: 2016-01-10 | Discharge: 2016-01-10 | Disposition: A | Discharge status: Home / Self Care | Payer: Medicare Other | Source: Ambulatory Visit | Attending: Radiation Oncology | Admitting: Radiation Oncology

## 2016-01-10 DIAGNOSIS — Z7982 Long term (current) use of aspirin: Secondary | ICD-10-CM | POA: Diagnosis not present

## 2016-01-10 DIAGNOSIS — D420 Neoplasm of uncertain behavior of cerebral meninges: Secondary | ICD-10-CM | POA: Diagnosis not present

## 2016-01-10 DIAGNOSIS — Z79899 Other long term (current) drug therapy: Secondary | ICD-10-CM | POA: Diagnosis not present

## 2016-01-10 DIAGNOSIS — Z510 Encounter for antineoplastic radiation therapy: Secondary | ICD-10-CM | POA: Diagnosis not present

## 2016-01-11 ENCOUNTER — Ambulatory Visit
Admission: RE | Admit: 2016-01-11 | Discharge: 2016-01-11 | Disposition: A | Discharge status: Home / Self Care | Payer: Medicare Other | Source: Ambulatory Visit | Attending: Radiation Oncology | Admitting: Radiation Oncology

## 2016-01-11 DIAGNOSIS — Z7982 Long term (current) use of aspirin: Secondary | ICD-10-CM | POA: Diagnosis not present

## 2016-01-11 DIAGNOSIS — Z510 Encounter for antineoplastic radiation therapy: Secondary | ICD-10-CM | POA: Diagnosis not present

## 2016-01-11 DIAGNOSIS — Z79899 Other long term (current) drug therapy: Secondary | ICD-10-CM | POA: Diagnosis not present

## 2016-01-11 DIAGNOSIS — D420 Neoplasm of uncertain behavior of cerebral meninges: Secondary | ICD-10-CM | POA: Diagnosis not present

## 2016-01-12 ENCOUNTER — Encounter: Payer: Self-pay | Admitting: Radiation Oncology

## 2016-01-12 ENCOUNTER — Ambulatory Visit
Admission: RE | Admit: 2016-01-12 | Discharge: 2016-01-12 | Disposition: A | Discharge status: Home / Self Care | Payer: Medicare Other | Source: Ambulatory Visit | Attending: Radiation Oncology | Admitting: Radiation Oncology

## 2016-01-12 DIAGNOSIS — D420 Neoplasm of uncertain behavior of cerebral meninges: Secondary | ICD-10-CM | POA: Diagnosis not present

## 2016-01-12 DIAGNOSIS — Z510 Encounter for antineoplastic radiation therapy: Secondary | ICD-10-CM | POA: Diagnosis not present

## 2016-01-12 DIAGNOSIS — Z7982 Long term (current) use of aspirin: Secondary | ICD-10-CM | POA: Diagnosis not present

## 2016-01-12 DIAGNOSIS — Z79899 Other long term (current) drug therapy: Secondary | ICD-10-CM | POA: Diagnosis not present

## 2016-01-15 ENCOUNTER — Ambulatory Visit
Admission: RE | Admit: 2016-01-15 | Discharge: 2016-01-15 | Disposition: A | Discharge status: Home / Self Care | Payer: Medicare Other | Source: Ambulatory Visit | Attending: Radiation Oncology | Admitting: Radiation Oncology

## 2016-01-15 ENCOUNTER — Encounter: Payer: Self-pay | Admitting: Radiation Oncology

## 2016-01-15 VITALS — BP 119/67 | HR 78 | Temp 97.8°F | Resp 18 | Ht 65.0 in | Wt 197.6 lb

## 2016-01-15 DIAGNOSIS — Z7982 Long term (current) use of aspirin: Secondary | ICD-10-CM | POA: Diagnosis not present

## 2016-01-15 DIAGNOSIS — Z510 Encounter for antineoplastic radiation therapy: Secondary | ICD-10-CM | POA: Diagnosis not present

## 2016-01-15 DIAGNOSIS — D420 Neoplasm of uncertain behavior of cerebral meninges: Secondary | ICD-10-CM

## 2016-01-15 DIAGNOSIS — Z79899 Other long term (current) drug therapy: Secondary | ICD-10-CM | POA: Diagnosis not present

## 2016-01-15 MED ORDER — SONAFINE EX EMUL
1.0000 "application " | Freq: Two times a day (BID) | CUTANEOUS | Status: DC
  Administered 2016-01-15: 1 via TOPICAL

## 2016-01-15 NOTE — Progress Notes (Signed)
   Weekly Management Note:  outpatient    ICD-9-CM ICD-10-CM   1. Atypical meningioma of brain ([facility]) 237.6 D42.0 SONAFINE emulsion 1 application    Current Dose:  52.2 Gy  Projected Dose: 55.8 Gy   Narrative:  The patient presents for routine under treatment assessment.  CBCT/MVCT images/Port film x-rays were reviewed.  The chart was checked.   No new issues other than additional hair loss over scalp  Physical Findings:  Wt Readings from Last 3 Encounters:  01/15/16 197 lb 9.6 oz (89.6 kg)  01/08/16 193 lb (87.5 kg)  01/01/16 192 lb 9.6 oz (87.4 kg)    height is 5\' 5"  (1.651 m) and weight is 197 lb 9.6 oz (89.6 kg). Her oral temperature is 97.8 F (36.6 C). Her blood pressure is 119/67 and her pulse is 78. Her respiration is 18 and oxygen saturation is 100%.     erythematous scalp/ progressive alopecia centrally.  Ambulatory.  CBC    Component Value Date/Time   WBC 20.9 (H) 10/17/2015 0352   RBC 3.18 (L) 10/17/2015 0352   HGB 9.7 (L) 10/17/2015 0352   HGB 13.2 01/25/2014 0954   HCT 29.5 (L) 10/17/2015 0352   HCT 39.9 01/25/2014 0954   PLT 307 10/17/2015 0352   PLT 224 01/25/2014 0954   MCV 92.8 10/17/2015 0352   MCV 92.4 01/25/2014 0954   MCH 30.5 10/17/2015 0352   MCHC 32.9 10/17/2015 0352   RDW 12.9 10/17/2015 0352   RDW 12.7 01/25/2014 0954   LYMPHSABS 2.3 10/13/2015 1520   LYMPHSABS 2.4 01/25/2014 0954   MONOABS 0.6 10/13/2015 1520   MONOABS 0.5 01/25/2014 0954   EOSABS 0.2 10/13/2015 1520   EOSABS 0.2 01/25/2014 0954   EOSABS 0.2 11/07/2009 1355   BASOSABS 0.0 10/13/2015 1520   BASOSABS 0.0 01/25/2014 0954     CMP     Component Value Date/Time   NA 141 11/21/2015 1053   NA 142 01/25/2014 0954   K 4.1 11/21/2015 1053   K 3.9 01/25/2014 0954   CL 103 11/21/2015 1053   CL 108 (H) 01/20/2012 0931   CO2 25 11/21/2015 1053   CO2 28 01/25/2014 0954   GLUCOSE 98 11/21/2015 1053   GLUCOSE 168 (H) 10/17/2015 0352   GLUCOSE 104 01/25/2014 0954   GLUCOSE 85  01/20/2012 0931   BUN 18 11/21/2015 1053   BUN 18.0 01/25/2014 0954   CREATININE 0.75 11/21/2015 1053   CREATININE 0.7 01/25/2014 0954   CALCIUM 9.6 11/21/2015 1053   CALCIUM 9.6 01/25/2014 0954   PROT 6.5 11/21/2015 1053   PROT 6.6 01/25/2014 0954   ALBUMIN 4.3 11/21/2015 1053   ALBUMIN 3.7 01/25/2014 0954   AST 29 11/21/2015 1053   AST 33 01/25/2014 0954   ALT 26 11/21/2015 1053   ALT 37 01/25/2014 0954   ALKPHOS 96 11/21/2015 1053   ALKPHOS 66 01/25/2014 0954   BILITOT 0.5 11/21/2015 1053   BILITOT 0.62 01/25/2014 0954   GFRNONAA 86 11/21/2015 1053   GFRNONAA >89 08/20/2012 1038   GFRAA 99 11/21/2015 1053   GFRAA >89 08/20/2012 1038    Impression:  The patient is tolerating radiotherapy.   Plan:  Continue radiotherapy as planned. Message sent to Mont [place], RT re: [facility] tx planning. Discussed w/ patient for peripheral meningiomas  -----------------------------------  Eppie Gibson, MD

## 2016-01-15 NOTE — Progress Notes (Signed)
Julia Poole is here for her 29th fraction of radiation to her Falx Meningioma. She denies pain.  Reports fatigue. She is eating well per her report. The skin to her radiation site is slightly red, she reports some mild itching at times, which sonafine helps relieve.   She is using the sonafine daily.  No change in vision wears glasses to drive and watch TV.  New tube of Sonafine given. Wt Readings from Last 3 Encounters:  01/15/16 197 lb 9.6 oz (89.6 kg)  01/08/16 193 lb (87.5 kg)  01/01/16 192 lb 9.6 oz (87.4 kg)  BP 119/67 (BP Location: Left Arm, Patient Position: Sitting, Cuff Size: Normal)   Pulse 78   Temp 97.8 F (36.6 C) (Oral)   Resp 18   Ht 5\' 5"  (1.651 m)   Wt 197 lb 9.6 oz (89.6 kg)   SpO2 100%   BMI 32.88 kg/m

## 2016-01-16 ENCOUNTER — Ambulatory Visit
Admission: RE | Admit: 2016-01-16 | Discharge: 2016-01-16 | Disposition: A | Discharge status: Home / Self Care | Payer: Medicare Other | Source: Ambulatory Visit | Attending: Radiation Oncology | Admitting: Radiation Oncology

## 2016-01-16 ENCOUNTER — Other Ambulatory Visit: Payer: Self-pay | Admitting: Radiation Therapy

## 2016-01-16 DIAGNOSIS — Z79899 Other long term (current) drug therapy: Secondary | ICD-10-CM | POA: Diagnosis not present

## 2016-01-16 DIAGNOSIS — D420 Neoplasm of uncertain behavior of cerebral meninges: Secondary | ICD-10-CM | POA: Diagnosis not present

## 2016-01-16 DIAGNOSIS — Z7982 Long term (current) use of aspirin: Secondary | ICD-10-CM | POA: Diagnosis not present

## 2016-01-16 DIAGNOSIS — Z510 Encounter for antineoplastic radiation therapy: Secondary | ICD-10-CM | POA: Diagnosis not present

## 2016-01-16 DIAGNOSIS — D329 Benign neoplasm of meninges, unspecified: Secondary | ICD-10-CM

## 2016-01-17 ENCOUNTER — Ambulatory Visit
Admission: RE | Admit: 2016-01-17 | Discharge: 2016-01-17 | Disposition: A | Discharge status: Home / Self Care | Payer: Medicare Other | Source: Ambulatory Visit | Attending: Radiation Oncology | Admitting: Radiation Oncology

## 2016-01-17 ENCOUNTER — Encounter: Payer: Self-pay | Admitting: Radiation Oncology

## 2016-01-17 VITALS — BP 134/83 | HR 80 | Temp 97.8°F | Ht 65.0 in | Wt 195.8 lb

## 2016-01-17 DIAGNOSIS — Z7982 Long term (current) use of aspirin: Secondary | ICD-10-CM | POA: Diagnosis not present

## 2016-01-17 DIAGNOSIS — Z510 Encounter for antineoplastic radiation therapy: Secondary | ICD-10-CM | POA: Diagnosis not present

## 2016-01-17 DIAGNOSIS — Z79899 Other long term (current) drug therapy: Secondary | ICD-10-CM | POA: Diagnosis not present

## 2016-01-17 DIAGNOSIS — D420 Neoplasm of uncertain behavior of cerebral meninges: Secondary | ICD-10-CM

## 2016-01-17 NOTE — Progress Notes (Signed)
Julia Poole is here for her 31st fraction of radiation to her Falx Meningioma. She denies pain. She does have some fatigue, especially in the evening. She is walking during the day at times. The skin to her radiation area is red, she is using the sonafine twice daily. She does have itching to the area which is relieved with the sonafine. She is emotional today about her upcoming [place] treatment. She is very claustrophobic and anxious about the mask and how she will handle her treatment. She is being provided with some calming essential oils for treatment by nursing.  BP 134/83   Pulse 80   Temp 97.8 F (36.6 C)   Ht 5\' 5"  (1.651 m)   Wt 195 lb 12.8 oz (88.8 kg)   SpO2 100% Comment: room air  BMI 32.58 kg/m    Wt Readings from Last 3 Encounters:  01/17/16 195 lb 12.8 oz (88.8 kg)  01/15/16 197 lb 9.6 oz (89.6 kg)  01/08/16 193 lb (87.5 kg)

## 2016-01-17 NOTE — Progress Notes (Signed)
Weekly Management Note:  outpatient    ICD-9-CM ICD-10-CM   1. Atypical meningioma of brain ([place]) 237.6 D42.0     Current Dose:  55.8 Gy  Projected Dose: 60.4 Gy   Narrative:  The patient presents for routine under treatment assessment.  CBCT/MVCT images/Port film x-rays were reviewed.  The chart was checked.  She denies pain. She does have some fatigue, especially in the evening. She is walking during the day at times. The skin to her radiation area is red. She is using Sonafine twice daily. She does have itching to the area which is relieved with the Sonafine. She is emotional today about upcoming [place] treatment. She is very claustrophobic and anxious about the mask and how she will handle her treatment. She is being provided with some calming essential oils for treatment by nursing.  Physical Findings:   Wt Readings from Last 3 Encounters:  01/17/16 195 lb 12.8 oz (88.8 kg)  01/15/16 197 lb 9.6 oz (89.6 kg)  01/08/16 193 lb (87.5 kg)    height is 5\' 5"  (1.651 m) and weight is 195 lb 12.8 oz (88.8 kg). Her temperature is 97.8 F (36.6 C). Her blood pressure is 134/83 and her pulse is 80. Her oxygen saturation is 100%.   Erythematous scalp/ progressive alopecia centrally. Ambulatory. Slightly anxious   CBC    Component Value Date/Time   WBC 20.9 (H) 10/17/2015 0352   RBC 3.18 (L) 10/17/2015 0352   HGB 9.7 (L) 10/17/2015 0352   HGB 13.2 01/25/2014 0954   HCT 29.5 (L) 10/17/2015 0352   HCT 39.9 01/25/2014 0954   PLT 307 10/17/2015 0352   PLT 224 01/25/2014 0954   MCV 92.8 10/17/2015 0352   MCV 92.4 01/25/2014 0954   MCH 30.5 10/17/2015 0352   MCHC 32.9 10/17/2015 0352   RDW 12.9 10/17/2015 0352   RDW 12.7 01/25/2014 0954   LYMPHSABS 2.3 10/13/2015 1520   LYMPHSABS 2.4 01/25/2014 0954   MONOABS 0.6 10/13/2015 1520   MONOABS 0.5 01/25/2014 0954   EOSABS 0.2 10/13/2015 1520   EOSABS 0.2 01/25/2014 0954   EOSABS 0.2 11/07/2009 1355   BASOSABS 0.0 10/13/2015 1520   BASOSABS 0.0 01/25/2014 0954     CMP     Component Value Date/Time   NA 141 11/21/2015 1053   NA 142 01/25/2014 0954   K 4.1 11/21/2015 1053   K 3.9 01/25/2014 0954   CL 103 11/21/2015 1053   CL 108 (H) 01/20/2012 0931   CO2 25 11/21/2015 1053   CO2 28 01/25/2014 0954   GLUCOSE 98 11/21/2015 1053   GLUCOSE 168 (H) 10/17/2015 0352   GLUCOSE 104 01/25/2014 0954   GLUCOSE 85 01/20/2012 0931   BUN 18 11/21/2015 1053   BUN 18.0 01/25/2014 0954   CREATININE 0.75 11/21/2015 1053   CREATININE 0.7 01/25/2014 0954   CALCIUM 9.6 11/21/2015 1053   CALCIUM 9.6 01/25/2014 0954   PROT 6.5 11/21/2015 1053   PROT 6.6 01/25/2014 0954   ALBUMIN 4.3 11/21/2015 1053   ALBUMIN 3.7 01/25/2014 0954   AST 29 11/21/2015 1053   AST 33 01/25/2014 0954   ALT 26 11/21/2015 1053   ALT 37 01/25/2014 0954   ALKPHOS 96 11/21/2015 1053   ALKPHOS 66 01/25/2014 0954   BILITOT 0.5 11/21/2015 1053   BILITOT 0.62 01/25/2014 0954   GFRNONAA 86 11/21/2015 1053   GFRNONAA >89 08/20/2012 1038   GFRAA 99 11/21/2015 1053   GFRAA >89 08/20/2012 1038    Impression:  The patient is tolerating radiotherapy.   Plan:  Continue radiotherapy as planned. She has a prescription for 0.5-1 mg of Lorazepam prior to wearing her radiation mask. Given her upcoming MRI and stereotatic radiosurgery, I instructed her today that it is okay to take up to 3 tabletsLorazepam if she does not feel calmed by the initial 2 tablets. Take two to three tablets on Wednesday the 25th of October before the mask is made around noon, and on that afternoon, one to two more tablets before the MRI depending on severity of claustrophobia.  Discussed precautions to take with this medication for safety.  She will try 2 tablets tonight while her husband is observing her.  -----------------------------------  Eppie Gibson, MD  This document serves as a record of services personally performed by Eppie Gibson, MD. It was created on her behalf by Bethann Humble, a trained medical scribe. The creation of this record is based on the scribe's personal observations and the provider's statements to them. This document has been checked and approved by the attending provider.

## 2016-01-18 ENCOUNTER — Telehealth: Payer: Self-pay | Admitting: Genetic Counselor

## 2016-01-18 ENCOUNTER — Ambulatory Visit
Admission: RE | Admit: 2016-01-18 | Discharge: 2016-01-18 | Disposition: A | Discharge status: Home / Self Care | Payer: Medicare Other | Source: Ambulatory Visit | Attending: Radiation Oncology | Admitting: Radiation Oncology

## 2016-01-18 ENCOUNTER — Encounter: Payer: Self-pay | Admitting: Genetic Counselor

## 2016-01-18 DIAGNOSIS — D420 Neoplasm of uncertain behavior of cerebral meninges: Secondary | ICD-10-CM | POA: Diagnosis not present

## 2016-01-18 DIAGNOSIS — Z79899 Other long term (current) drug therapy: Secondary | ICD-10-CM | POA: Diagnosis not present

## 2016-01-18 DIAGNOSIS — Z1379 Encounter for other screening for genetic and chromosomal anomalies: Secondary | ICD-10-CM | POA: Insufficient documentation

## 2016-01-18 DIAGNOSIS — D329 Benign neoplasm of meninges, unspecified: Secondary | ICD-10-CM

## 2016-01-18 DIAGNOSIS — Z510 Encounter for antineoplastic radiation therapy: Secondary | ICD-10-CM | POA: Diagnosis not present

## 2016-01-18 DIAGNOSIS — Z7982 Long term (current) use of aspirin: Secondary | ICD-10-CM | POA: Diagnosis not present

## 2016-01-18 LAB — BUN AND CREATININE (CC13)
BUN: 13 mg/dL (ref 7.0–26.0)
CREATININE: 0.8 mg/dL (ref 0.6–1.1)
EGFR: 85 mL/min/{1.73_m2} — ABNORMAL LOW (ref >90)

## 2016-01-18 NOTE — Telephone Encounter (Signed)
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  Explained that this gene is a moderate risk breast cancer gene and therefore probably came into play with some of her breast cancer risk.  Explained that I was not aware of ATM being associated with meningiomas.  Offered to have her come in to discuss result.  She wants to wait until mid November.  I will call her the second week of November to schedule an appointment.

## 2016-01-19 ENCOUNTER — Ambulatory Visit
Admission: RE | Admit: 2016-01-19 | Discharge: 2016-01-19 | Disposition: A | Discharge status: Home / Self Care | Payer: Medicare Other | Source: Ambulatory Visit | Attending: Radiation Oncology | Admitting: Radiation Oncology

## 2016-01-19 ENCOUNTER — Encounter: Payer: Self-pay | Admitting: Radiation Oncology

## 2016-01-19 DIAGNOSIS — Z510 Encounter for antineoplastic radiation therapy: Secondary | ICD-10-CM | POA: Diagnosis not present

## 2016-01-19 DIAGNOSIS — D420 Neoplasm of uncertain behavior of cerebral meninges: Secondary | ICD-10-CM | POA: Diagnosis not present

## 2016-01-19 DIAGNOSIS — Z7982 Long term (current) use of aspirin: Secondary | ICD-10-CM | POA: Diagnosis not present

## 2016-01-19 DIAGNOSIS — Z79899 Other long term (current) drug therapy: Secondary | ICD-10-CM | POA: Diagnosis not present

## 2016-01-24 ENCOUNTER — Ambulatory Visit (HOSPITAL_COMMUNITY)
Admission: RE | Admit: 2016-01-24 | Discharge: 2016-01-24 | Disposition: A | Discharge status: Home / Self Care | Payer: Medicare Other | Source: Ambulatory Visit | Attending: Radiation Oncology | Admitting: Radiation Oncology

## 2016-01-24 ENCOUNTER — Ambulatory Visit
Admission: RE | Admit: 2016-01-24 | Discharge: 2016-01-24 | Disposition: A | Discharge status: Home / Self Care | Payer: Medicare Other | Source: Ambulatory Visit | Attending: Radiation Oncology | Admitting: Radiation Oncology

## 2016-01-24 ENCOUNTER — Telehealth: Payer: Self-pay | Admitting: Radiation Therapy

## 2016-01-24 DIAGNOSIS — D420 Neoplasm of uncertain behavior of cerebral meninges: Secondary | ICD-10-CM | POA: Diagnosis not present

## 2016-01-24 DIAGNOSIS — D329 Benign neoplasm of meninges, unspecified: Secondary | ICD-10-CM | POA: Diagnosis not present

## 2016-01-24 DIAGNOSIS — Z510 Encounter for antineoplastic radiation therapy: Secondary | ICD-10-CM | POA: Diagnosis not present

## 2016-01-24 DIAGNOSIS — Z7982 Long term (current) use of aspirin: Secondary | ICD-10-CM | POA: Diagnosis not present

## 2016-01-24 DIAGNOSIS — D320 Benign neoplasm of cerebral meninges: Secondary | ICD-10-CM | POA: Insufficient documentation

## 2016-01-24 DIAGNOSIS — Z79899 Other long term (current) drug therapy: Secondary | ICD-10-CM | POA: Diagnosis not present

## 2016-01-24 MED ORDER — GADOBENATE DIMEGLUMINE 529 MG/ML IV SOLN
19.0000 mL | Freq: Once | INTRAVENOUS | Status: AC | PRN
  Administered 2016-01-24: 19 mL via INTRAVENOUS

## 2016-01-24 NOTE — Progress Notes (Signed)
  Name: Julia Poole MRN: [id number]  Date: 01/24/2016  DOB: 06-Oct-1955  SIMULATION AND TREATMENT PLANNING NOTE    ICD-9-CM ICD-10-CM   1. 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  Identity was confirmed.  All relevant records and images related to the planned course of therapy were reviewed.  The patient freely provided informed written consent to proceed with treatment after reviewing the details related to the planned course of therapy. The consent form was witnessed and verified by the simulation staff. Intravenous access was established for contrast administration. Then, the patient was set-up in a stable reproducible supine position for radiation therapy.  A relocatable thermoplastic stereotactic head frame was fabricated for precise immobilization.  CT images were obtained.  Surface markings were placed.  The CT images were loaded into the planning software and fused with the patient's targeting MRI scan.  Then the target and avoidance structures were contoured.  Treatment planning then occurred.  The radiation prescription was entered and confirmed.  I have requested 3D planning  I have requested a DVH of the following structures: Brain stem, brain, left eye, right eye, lenses, optic chiasm, target volumes, uninvolved brain, and normal tissue.    SPECIAL TREATMENT PROCEDURE:  The planned course of therapy using radiation constitutes a special treatment procedure. Special care is required in the management of this patient for the following reasons:  High dose per fraction requiring special monitoring for increased  toxicities of treatment including daily imaging.  The special nature of the planned course of radiotherapy will require increased physician supervision and oversight to ensure patient's safety with optimal treatment outcomes.  PLAN:  The patient will receive 15 Gy in 1 fraction to both of the peripheral meningiomas with radiosurgical technique. ________________________________    Eppie Gibson, MD

## 2016-01-24 NOTE — Progress Notes (Addendum)
Does patient have an allergy to IV contrast dye?: No   Has patient ever received premedication for IV contrast dye?: No Took Ativan 0.5mg   took two tablets 1130 and 1210  Does patient take metformin?: No  If patient does take metformin when was the last dose: N/A  Date of lab work: [id number] 01-18-16 BUN: 13 CR: 0.8  IV site: Left antecubital at 1255  Wt Readings from Last 3 Encounters:  01/24/16 195 lb 6.4 oz (88.6 kg)  01/17/16 195 lb 12.8 oz (88.8 kg)  01/15/16 197 lb 9.6 oz (89.6 kg)  BP (!) 132/51   Pulse 90   Temp 97.7 F (36.5 C) (Oral)   Resp 18   Ht 5\' 5"  (1.651 m)   Wt 195 lb 6.4 oz (88.6 kg)   SpO2 100%   BMI 32.52 kg/m

## 2016-01-24 NOTE — Progress Notes (Signed)
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  Grade II resected meningioma, left parietal lobe, with 3 other meningiomas, unresected   INDICATION FOR TREATMENT: Curative  TREATMENT DATES: 12-06-15 to 01-19-16                         Site/dose:    1) Falx meningiomas (intact tumor and cavity) / 55.8 Gy in 31 fractions 2) Intact meningioma boost / 3.6 Gy in 2 fractions  Beams/energy:    1) IMRT /  6MV 2) IMRT /  6MV                              NARRATIVE:      She tolerated radiotherapy well to the falx intact meningioma and falx meningioma surgical cavity with alopecia centrally in the radiotherapy fields.                    PLAN:  Treatment planning in 1-2 weeks for radiosurgery to the 2 intact peripheral meningiomas.  -----------------------------------  Eppie Gibson, MD

## 2016-01-24 NOTE — Progress Notes (Signed)
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  The boost will be delivered with IMRT, using 3 VMAT armcs, and 6 MV photon energy.   IMRT plan/device were reviewed and approved. 3.6 Gy in 2 fractions prescribed.  -----------------------------------  Eppie Gibson, MD

## 2016-01-24 NOTE — Telephone Encounter (Signed)
Opened in error

## 2016-01-25 ENCOUNTER — Encounter: Payer: Self-pay | Admitting: Radiation Therapy

## 2016-01-25 DIAGNOSIS — Z79899 Other long term (current) drug therapy: Secondary | ICD-10-CM | POA: Diagnosis not present

## 2016-01-25 DIAGNOSIS — D420 Neoplasm of uncertain behavior of cerebral meninges: Secondary | ICD-10-CM | POA: Diagnosis not present

## 2016-01-25 DIAGNOSIS — Z7982 Long term (current) use of aspirin: Secondary | ICD-10-CM | POA: Diagnosis not present

## 2016-01-25 DIAGNOSIS — Z510 Encounter for antineoplastic radiation therapy: Secondary | ICD-10-CM | POA: Diagnosis not present

## 2016-01-29 DIAGNOSIS — Z510 Encounter for antineoplastic radiation therapy: Secondary | ICD-10-CM | POA: Diagnosis not present

## 2016-01-29 DIAGNOSIS — D420 Neoplasm of uncertain behavior of cerebral meninges: Secondary | ICD-10-CM | POA: Diagnosis not present

## 2016-01-29 DIAGNOSIS — Z7982 Long term (current) use of aspirin: Secondary | ICD-10-CM | POA: Diagnosis not present

## 2016-01-29 DIAGNOSIS — Z79899 Other long term (current) drug therapy: Secondary | ICD-10-CM | POA: Diagnosis not present

## 2016-01-31 ENCOUNTER — Encounter: Payer: Self-pay | Admitting: Radiation Oncology

## 2016-01-31 ENCOUNTER — Ambulatory Visit
Admission: RE | Admit: 2016-01-31 | Discharge: 2016-01-31 | Disposition: A | Discharge status: Home / Self Care | Payer: Medicare Other | Source: Ambulatory Visit | Attending: Radiation Oncology | Admitting: Radiation Oncology

## 2016-01-31 VITALS — BP 105/74 | HR 75 | Temp 97.7°F

## 2016-01-31 DIAGNOSIS — D420 Neoplasm of uncertain behavior of cerebral meninges: Secondary | ICD-10-CM | POA: Diagnosis not present

## 2016-01-31 DIAGNOSIS — Z79899 Other long term (current) drug therapy: Secondary | ICD-10-CM | POA: Diagnosis not present

## 2016-01-31 DIAGNOSIS — Z510 Encounter for antineoplastic radiation therapy: Secondary | ICD-10-CM | POA: Diagnosis not present

## 2016-01-31 DIAGNOSIS — Z7982 Long term (current) use of aspirin: Secondary | ICD-10-CM | POA: Diagnosis not present

## 2016-01-31 DIAGNOSIS — D320 Benign neoplasm of cerebral meninges: Secondary | ICD-10-CM | POA: Diagnosis not present

## 2016-01-31 NOTE — Progress Notes (Addendum)
  Radiation Oncology         [phone] ________________________________  Stereotactic Treatment Procedure Note  Name: Julia Poole MRN: [id number]  Date: 01/31/2016  DOB: 1956-01-08  SPECIAL TREATMENT PROCEDURE Atypical meningiomas of brain ([place]) 237.6 D42.0   3D TREATMENT PLANNING AND DOSIMETRY:  The patient's radiation plan was reviewed and approved by neurosurgery and radiation oncology prior to treatment.  It showed 3-dimensional radiation distributions overlaid onto the planning CT/MRI image set.  The [facility] for the target structures as well as the organs at risk were reviewed. The documentation of the 3D plan and dosimetry are filed in the radiation oncology EMR. Composite brain planned reviewed.  NARRATIVE:  Julia Poole was brought to the TrueBeam stereotactic radiation treatment machine and placed supine on the CT couch. The head frame was applied, and the patient was set up for stereotactic radiosurgery.  Neurosurgery was present for the set-up and delivery  SIMULATION VERIFICATION:  In the couch zero-angle position, the patient underwent Exactrac imaging using the Brainlab system with orthogonal KV images.  These were carefully aligned and repeated to confirm treatment position for each of the isocenters.  The Exactrac snap film verification was repeated at each couch angle.  SPECIAL TREATMENT PROCEDURE: Julia Poole received stereotactic radiosurgery to the following targets: Right Frontal Convexity 13mm target was treated using 3 Dynamic Conformal Arcs to a prescription dose of 15 Gy.  ExacTrac Snap verification was performed for each couch angle. Left Frontal Convexity 68mm target was treated using 4 Dynamic Conformal Arcs to a prescription dose of 15 Gy.  ExacTrac Snap verification was performed for each couch angle.   This constitutes a special treatment procedure due to the ablative dose delivered and the technical nature of treatment.  This highly technical modality of  treatment ensures that the ablative dose is centered on the patient's tumor while sparing normal tissues from excessive dose and risk of detrimental effects.  STEREOTACTIC TREATMENT MANAGEMENT:  Following delivery, the patient was transported to nursing in stable condition and monitored for possible acute effects.  Vital signs were recorded BP 105/74   Pulse 75   Temp 97.7 F (36.5 C)   SpO2 100% Comment: room air. The patient tolerated treatment without significant acute effects, and was discharged to home in stable condition.    PLAN: Follow-up in one month.  ________________________________   Eppie Gibson, MD

## 2016-01-31 NOTE — Progress Notes (Signed)
[facility] End of Treatment Note  Name:Julia Poole  Date: 01/31/2016 [id number] DOB:01-May-1955   Status:outpatient    DIAGNOSIS:  Atypical meningiomas of brain ([place]) 237.6 D42.0     INDICATION FOR TREATMENT: Curative  TREATMENT DATES: 01-31-16                         SITE/DOSE/BEAMS/ENERGY:   Right Frontal Convexity 69mm target was treated using 3 Dynamic Conformal Arcs to a prescription dose of 15 Gy.   Left Frontal Convexity 30mm target was treated using 4 Dynamic Conformal Arcs to a prescription dose of 15 Gy.      6MV FFF photons were used with SRS technique.       NARRATIVE:    She tolerated radiosurgery well.  PLAN: Routine followup in one month. Patient instructed to call if questions or worsening complaints in interim.  -----------------------------------  Eppie Gibson, MD

## 2016-01-31 NOTE — Progress Notes (Signed)
Julia Poole completed her [place] treatment today. She denies headaches or blurred vision. She feels well and denies any concerns at this time. Her husband will drive her home. She knows to call if she has any further questions or concerns.   1515 BP 127/68, pulse 86, 100% room air and temperature 97.9  1600 BP 105/74, pulse 75, 100% room air and temperature 97.7.

## 2016-01-31 NOTE — Op Note (Signed)
  Name: Julia Poole  MRN: [id number]  Date: 01/31/2016   DOB: 06-03-1955  Stereotactic Radiosurgery Operative Note  PRE-OPERATIVE DIAGNOSIS:  Multiple Intracranial Meningiomas  POST-OPERATIVE DIAGNOSIS:  Multiple Intracranial Meningiomas  PROCEDURE:  Stereotactic Radiosurgery  SURGEON:  Kevan Ny Ditty, MD  NARRATIVE: The patient underwent a radiation treatment planning session in the radiation oncology simulation suite under the care of the radiation oncology physician and physicist.  I participated closely in the radiation treatment planning afterwards. The patient underwent planning CT which was fused to 3T high resolution MRI with 1 mm axial slices.  These images were fused on the planning system.  We contoured the gross target volumes and subsequently expanded this to yield the Planning Target Volume. I actively participated in the planning process.  I helped to define and review the target contours and also the contours of the optic pathway, eyes, brainstem and selected nearby organs at risk.  All the dose constraints for critical structures were reviewed and compared to AAPM Task Group 101.  The prescription dose conformity was reviewed.  I approved the plan electronically.    Accordingly, [name] was brought to the TrueBeam stereotactic radiation treatment linac and placed in the custom immobilization mask.  The patient was aligned according to the IR fiducial markers with BrainLab Exactrac, then orthogonal x-rays were used in ExacTrac with the 6DOF robotic table and the shifts were made to align the patient  [name] received stereotactic radiosurgery uneventfully.    Lesions treated:  2   Complex lesions treated:  0 (>3.5 cm, <67mm of optic path, or within the brainstem)   The detailed description of the procedure is recorded in the radiation oncology procedure note.  I was present for the duration of the procedure.  DISPOSITION:  Following delivery, the patient was  transported to nursing in stable condition and monitored for possible acute effects to be discharged to home in stable condition with follow-up in one month.  Kevan Ny Ditty, MD 01/31/2016 2:54 PM

## 2016-01-31 NOTE — Addendum Note (Signed)
Encounter addended by: Eppie Gibson, MD on: 01/31/2016  5:28 PM<BR>    Actions taken: Sign clinical note

## 2016-02-06 DIAGNOSIS — H25093 Other age-related incipient cataract, bilateral: Secondary | ICD-10-CM | POA: Diagnosis not present

## 2016-02-13 ENCOUNTER — Telehealth: Payer: Self-pay | Admitting: Genetic Counselor

## 2016-02-13 NOTE — Telephone Encounter (Signed)
Discussed having her come back in to discuss the ATM mutation identified on genetic testing.  She will come in on 03/01/2016 just before her Radiation appointment to discuss her positive ATM result.

## 2016-02-20 ENCOUNTER — Telehealth: Payer: Self-pay | Admitting: Nurse Practitioner

## 2016-02-20 NOTE — Telephone Encounter (Signed)
Spoke with [facility] at [facility], patient has transferred all prescriptions from [facility] to [facility] and is requesting refill of Tylenol #3, which they do not have a prescription for.  Patient was last seen here on 11/21/15 by Chevis Pretty.  Advised DaVina to let patient know she would need to be seen and evaluated by Shelah Lewandowsky.

## 2016-02-26 ENCOUNTER — Encounter: Payer: Self-pay | Admitting: Nurse Practitioner

## 2016-02-26 ENCOUNTER — Ambulatory Visit (INDEPENDENT_AMBULATORY_CARE_PROVIDER_SITE_OTHER): Payer: Medicare Other | Admitting: Nurse Practitioner

## 2016-02-26 VITALS — BP 113/70 | HR 77 | Temp 97.4°F | Ht 65.0 in | Wt 197.0 lb

## 2016-02-26 DIAGNOSIS — D420 Neoplasm of uncertain behavior of cerebral meninges: Secondary | ICD-10-CM | POA: Diagnosis not present

## 2016-02-26 DIAGNOSIS — Z23 Encounter for immunization: Secondary | ICD-10-CM | POA: Diagnosis not present

## 2016-02-26 DIAGNOSIS — E034 Atrophy of thyroid (acquired): Secondary | ICD-10-CM | POA: Diagnosis not present

## 2016-02-26 DIAGNOSIS — Z6832 Body mass index (BMI) 32.0-32.9, adult: Secondary | ICD-10-CM

## 2016-02-26 DIAGNOSIS — M549 Dorsalgia, unspecified: Secondary | ICD-10-CM

## 2016-02-26 DIAGNOSIS — E782 Mixed hyperlipidemia: Secondary | ICD-10-CM | POA: Diagnosis not present

## 2016-02-26 DIAGNOSIS — G8929 Other chronic pain: Secondary | ICD-10-CM

## 2016-02-26 DIAGNOSIS — E785 Hyperlipidemia, unspecified: Secondary | ICD-10-CM

## 2016-02-26 MED ORDER — ACETAMINOPHEN-CODEINE #3 300-30 MG PO TABS: ORAL_TABLET | ORAL | 2 refills | Status: DC

## 2016-02-26 MED ORDER — LEVETIRACETAM 1000 MG PO TABS: 1000.0000 mg | ORAL_TABLET | Freq: Two times a day (BID) | ORAL | 5 refills | Status: DC

## 2016-02-26 MED ORDER — ROSUVASTATIN CALCIUM 10 MG PO TABS: 10.0000 mg | ORAL_TABLET | Freq: Every day | ORAL | 1 refills | Status: DC

## 2016-02-26 MED ORDER — LEVOTHYROXINE SODIUM 50 MCG PO TABS: 50.0000 ug | ORAL_TABLET | Freq: Every day | ORAL | 1 refills | Status: DC

## 2016-02-26 NOTE — Progress Notes (Signed)
Subjective:    Patient ID: Julia Poole, female    DOB: 06/06/1955, 60 y.o.   MRN: [id number]  Patient here today for follow up of chronic medical problems.  Outpatient Encounter Prescriptions as of 02/26/2016  Medication Sig  . acetaminophen-codeine (TYLENOL #3) 300-30 MG tablet TAKE ONE TABLET BY MOUTH EVERY FOUR HOURS AS NEEDED FOR PAIN  . Ascorbic Acid (VITAMIN C) 500 MG tablet Take 500 mg by mouth daily.   Marland Kitchen aspirin 81 MG tablet Take 81 mg by mouth daily.    . Calcium-Vitamin D (CALTRATE 600 PLUS-VIT D PO) Take 600 mg by mouth 2 (two) times daily.  . Cholecalciferol (VITAMIN D) 2000 UNITS tablet Take 2,000 Units by mouth 2 (two) times daily.  . cyanocobalamin 100 MCG tablet Take 100 mcg by mouth daily.  Marland Kitchen FLAXSEED, LINSEED, PO Take by mouth.  Marland Kitchen GARLIC PO Take [id number] mg by mouth daily.  Marland Kitchen levETIRAcetam (KEPPRA) 1000 MG tablet Take 1 tablet (1,000 mg total) by mouth 2 (two) times daily. (Patient taking differently: Take 500 mg by mouth 2 (two) times daily. )  . levothyroxine (SYNTHROID, LEVOTHROID) 50 MCG tablet Take 1 tablet (50 mcg total) by mouth daily.  . Multiple Vitamins-Minerals (CENTRUM SILVER PO) Take by mouth.    Marland Kitchen oxaprozin (DAYPRO) 600 MG tablet Take 300 mg by mouth daily.   . rosuvastatin (CRESTOR) 10 MG tablet Take 1 tablet (10 mg total) by mouth daily.  . Wound Dressings (SONAFINE EX) Apply topically.  . [DISCONTINUED] LORazepam (ATIVAN) 0.5 MG tablet Take 1-2 tabs as needed, about 20-30 min prior to wearing radiation mask   No facility-administered encounter medications on file as of 02/26/2016.     Hyperlipidemia  This is a chronic problem. Recent lipid tests were reviewed and are variable. Exacerbating diseases include obesity. She has no history of diabetes or hypothyroidism. Pertinent negatives include no myalgias. Current antihyperlipidemic treatment includes statins. The current treatment provides no improvement of lipids. Compliance problems include adherence to  diet and adherence to exercise.   Thyroid Problem  Patient reports no cold intolerance, constipation, diaphoresis, menstrual problem, palpitations, tremors or visual change. Her past medical history is significant for hyperlipidemia. There is no history of diabetes.  Chronic Low Back Pain/ spondylolisthesis Patient sees Dr. Louanne Skye & Dr. Carloyn Manner- Slipped vertebra- waiting for slippage to worsen before doing surgery. He does no do pain medication. Atypical meningioma of brain She had emergency brain surgery  Back in July and has just finished having radiation treatments. SHe is doing well. Denies any confusion, dizziness, etc. SHe is very tired but otherwise doing well.  Review of Systems  Constitutional: Negative for diaphoresis.  Cardiovascular: Negative for palpitations.  Gastrointestinal: Negative for constipation.  Endocrine: Negative for cold intolerance.  Genitourinary: Negative for menstrual problem.  Musculoskeletal: Positive for back pain. Negative for myalgias.  Neurological: Negative for tremors.  All other systems reviewed and are negative.      Objective:   Physical Exam  Constitutional: She is oriented to person, place, and time. She appears well-developed and well-nourished.  HENT:  Nose: Nose normal.  Mouth/Throat: Oropharynx is clear and moist.  Eyes: EOM are normal.  Neck: Trachea normal, normal range of motion and full passive range of motion without pain. Neck supple. No JVD present. Carotid bruit is not present. No thyromegaly present.  Cardiovascular: Normal rate, regular rhythm, normal heart sounds and intact distal pulses.  Exam reveals no gallop and no friction rub.   No murmur heard. Pulmonary/Chest:  Effort normal and breath sounds normal.  Abdominal: Soft. Bowel sounds are normal. She exhibits no distension and no mass. There is no tenderness.  Musculoskeletal: Normal range of motion.  Slowly rises from laying position due to back pain.  Lymphadenopathy:    She  has no cervical adenopathy.  Neurological: She is alert and oriented to person, place, and time. She has normal reflexes. No cranial nerve deficit.  Skin: Skin is warm and dry.  Psychiatric: She has a normal mood and affect. Her behavior is normal. Judgment and thought content normal.    BP 113/70   Pulse 77   Temp 97.4 F (36.3 C) (Oral)   Ht 5\' 5"  (1.651 m)   Wt 197 lb (89.4 kg)   BMI 32.78 kg/m        Assessment & Plan:  1. Hypothyroidism due to acquired atrophy of thyroid - levothyroxine (SYNTHROID, LEVOTHROID) 50 MCG tablet; Take 1 tablet (50 mcg total) by mouth daily.  Dispense: 90 tablet; Refill: 1  2. 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  Dispense: 60 tablet; Refill: 5  3. BMI 32.0-32.9,adult Discussed diet and exercise for person with BMI >25 Will recheck weight in 3-6 months  4. Chronic back pain greater than 3 months duration  5. Mixed hyperlipidemia Low fat diet - rosuvastatin (CRESTOR) 10 MG tablet; Take 1 tablet (10 mg total) by mouth daily.  Dispense: 90 tablet; Refill: 1    Labs pending Health maintenance reviewed Diet and exercise encouraged Continue all meds Follow up  In 3 months   [place], FNP

## 2016-02-26 NOTE — Addendum Note (Signed)
Addended by: Chevis Pretty on: 02/26/2016 03:11 PM   Modules accepted: Orders

## 2016-02-26 NOTE — Addendum Note (Signed)
Addended by: Rolena Infante on: 02/26/2016 03:41 PM   Modules accepted: Orders

## 2016-02-26 NOTE — Patient Instructions (Signed)
Fall Prevention in the Home Introduction Falls can cause injuries. They can happen to people of all ages. There are many things you can do to make your home safe and to help prevent falls. What can I do on the outside of my home?  Regularly fix the edges of walkways and driveways and fix any cracks.  Remove anything that might make you trip as you walk through a door, such as a raised step or threshold.  Trim any bushes or trees on the path to your home.  Use bright outdoor lighting.  Clear any walking paths of anything that might make someone trip, such as rocks or tools.  Regularly check to see if handrails are loose or broken. Make sure that both sides of any steps have handrails.  Any raised decks and porches should have guardrails on the edges.  Have any leaves, snow, or ice cleared regularly.  Use sand or salt on walking paths during winter.  Clean up any spills in your garage right away. This includes oil or grease spills. What can I do in the bathroom?  Use night lights.  Install grab bars by the toilet and in the tub and shower. Do not use towel bars as grab bars.  Use non-skid mats or decals in the tub or shower.  If you need to sit down in the shower, use a plastic, non-slip stool.  Keep the floor dry. Clean up any water that spills on the floor as soon as it happens.  Remove soap buildup in the tub or shower regularly.  Attach bath mats securely with double-sided non-slip rug tape.  Do not have throw rugs and other things on the floor that can make you trip. What can I do in the bedroom?  Use night lights.  Make sure that you have a light by your bed that is easy to reach.  Do not use any sheets or blankets that are too big for your bed. They should not hang down onto the floor.  Have a firm chair that has side arms. You can use this for support while you get dressed.  Do not have throw rugs and other things on the floor that can make you trip. What can  I do in the kitchen?  Clean up any spills right away.  Avoid walking on wet floors.  Keep items that you use a lot in easy-to-reach places.  If you need to reach something above you, use a strong step stool that has a grab bar.  Keep electrical cords out of the way.  Do not use floor polish or wax that makes floors slippery. If you must use wax, use non-skid floor wax.  Do not have throw rugs and other things on the floor that can make you trip. What can I do with my stairs?  Do not leave any items on the stairs.  Make sure that there are handrails on both sides of the stairs and use them. Fix handrails that are broken or loose. Make sure that handrails are as long as the stairways.  Check any carpeting to make sure that it is firmly attached to the stairs. Fix any carpet that is loose or worn.  Avoid having throw rugs at the top or bottom of the stairs. If you do have throw rugs, attach them to the floor with carpet tape.  Make sure that you have a light switch at the top of the stairs and the bottom of the stairs. If you   do not have them, ask someone to add them for you. What else can I do to help prevent falls?  Wear shoes that:  Do not have high heels.  Have rubber bottoms.  Are comfortable and fit you well.  Are closed at the toe. Do not wear sandals.  If you use a stepladder:  Make sure that it is fully opened. Do not climb a closed stepladder.  Make sure that both sides of the stepladder are locked into place.  Ask someone to hold it for you, if possible.  Clearly mark and make sure that you can see:  Any grab bars or handrails.  First and last steps.  Where the edge of each step is.  Use tools that help you move around (mobility aids) if they are needed. These include:  Canes.  Walkers.  Scooters.  Crutches.  Turn on the lights when you go into a dark area. Replace any light bulbs as soon as they burn out.  Set up your furniture so you have a  clear path. Avoid moving your furniture around.  If any of your floors are uneven, fix them.  If there are any pets around you, be aware of where they are.  Review your medicines with your doctor. Some medicines can make you feel dizzy. This can increase your chance of falling. Ask your doctor what other things that you can do to help prevent falls. This information is not intended to replace advice given to you by your health care provider. Make sure you discuss any questions you have with your health care provider. Document Released: 01/12/2009 Document Revised: 08/24/2015 Document Reviewed: 04/22/2014  2017 Elsevier  

## 2016-02-27 LAB — CMP14+EGFR
A/G RATIO: 2.1 (ref 1.2–2.2)
ALT: 23 IU/L (ref 0–32)
AST: 25 IU/L (ref 0–40)
Albumin: 4.4 g/dL (ref 3.6–4.8)
Alkaline Phosphatase: 89 IU/L (ref 39–117)
BUN/Creatinine Ratio: 23 (ref 12–28)
BUN: 16 mg/dL (ref 8–27)
Bilirubin Total: 0.7 mg/dL (ref 0.0–1.2)
CALCIUM: 9.3 mg/dL (ref 8.7–10.3)
CO2: 25 mmol/L (ref 18–29)
Chloride: 104 mmol/L (ref 96–106)
Creatinine, Ser: 0.69 mg/dL (ref 0.57–1.00)
GFR, EST AFRICAN AMERICAN: 108 mL/min/{1.73_m2} (ref >59)
GFR, EST NON AFRICAN AMERICAN: 94 mL/min/{1.73_m2} (ref >59)
GLUCOSE: 105 mg/dL — AB (ref 65–99)
Globulin, Total: 2.1 g/dL (ref 1.5–4.5)
POTASSIUM: 4 mmol/L (ref 3.5–5.2)
Sodium: 145 mmol/L — ABNORMAL HIGH (ref 134–144)
TOTAL PROTEIN: 6.5 g/dL (ref 6.0–8.5)

## 2016-02-27 LAB — LIPID PANEL
CHOL/HDL RATIO: 2.7 ratio (ref 0.0–4.4)
Cholesterol, Total: 136 mg/dL (ref 100–199)
HDL: 50 mg/dL (ref >39)
LDL Calculated: 65 mg/dL (ref 0–99)
TRIGLYCERIDES: 105 mg/dL (ref 0–149)
VLDL CHOLESTEROL CAL: 21 mg/dL (ref 5–40)

## 2016-02-29 ENCOUNTER — Telehealth: Payer: Self-pay | Admitting: Nurse Practitioner

## 2016-02-29 NOTE — Telephone Encounter (Signed)
Aware of results. 

## 2016-03-01 ENCOUNTER — Ambulatory Visit
Admission: RE | Admit: 2016-03-01 | Discharge: 2016-03-01 | Disposition: A | Discharge status: Home / Self Care | Payer: Medicare Other | Source: Ambulatory Visit | Attending: Radiation Oncology | Admitting: Radiation Oncology

## 2016-03-01 ENCOUNTER — Ambulatory Visit: Payer: Medicare Other | Admitting: Genetic Counselor

## 2016-03-01 VITALS — BP 124/85 | HR 73 | Temp 98.0°F | Resp 10

## 2016-03-01 DIAGNOSIS — Z885 Allergy status to narcotic agent status: Secondary | ICD-10-CM | POA: Insufficient documentation

## 2016-03-01 DIAGNOSIS — D320 Benign neoplasm of cerebral meninges: Secondary | ICD-10-CM | POA: Insufficient documentation

## 2016-03-01 DIAGNOSIS — Z1379 Encounter for other screening for genetic and chromosomal anomalies: Secondary | ICD-10-CM

## 2016-03-01 DIAGNOSIS — Z803 Family history of malignant neoplasm of breast: Secondary | ICD-10-CM

## 2016-03-01 DIAGNOSIS — L659 Nonscarring hair loss, unspecified: Secondary | ICD-10-CM | POA: Insufficient documentation

## 2016-03-01 DIAGNOSIS — D420 Neoplasm of uncertain behavior of cerebral meninges: Secondary | ICD-10-CM

## 2016-03-01 DIAGNOSIS — Z800 Family history of malignant neoplasm of digestive organs: Secondary | ICD-10-CM

## 2016-03-01 DIAGNOSIS — Z923 Personal history of irradiation: Secondary | ICD-10-CM | POA: Diagnosis not present

## 2016-03-01 DIAGNOSIS — C50911 Malignant neoplasm of unspecified site of right female breast: Secondary | ICD-10-CM

## 2016-03-01 DIAGNOSIS — Z8041 Family history of malignant neoplasm of ovary: Secondary | ICD-10-CM

## 2016-03-01 DIAGNOSIS — Z79899 Other long term (current) drug therapy: Secondary | ICD-10-CM | POA: Diagnosis not present

## 2016-03-01 DIAGNOSIS — Z7982 Long term (current) use of aspirin: Secondary | ICD-10-CM | POA: Diagnosis not present

## 2016-03-01 NOTE — Progress Notes (Signed)
PAIN: She is currently in no pain. NEURO: Pt alert & oriented x 3 with fluent speech, gait normal, reflexes normal and symmetric. Pt reports positive for occasional visual blurring in both eyes, PERRLA, denies auditory changes. Pt presenting appropriate quality, quantity and organization of sentences. Pt reports occasional dull headaches over lower base of skull, doesn't feel discomfort is enough to take tylenol.  Headaches occur approximately 1-2 times a week.  SKIN: Central portion of scalp noted substantial hair loss, two pimple like areas noted.  Reports hair continued hair loss.   OTHER: Pt complains of fatigue but it is improving.  BP 124/85   Pulse 73   Temp 98 F (36.7 C) (Oral)   Resp 10   SpO2 100%  Wt Readings from Last 3 Encounters:  02/26/16 197 lb (89.4 kg)  01/24/16 195 lb 6.4 oz (88.6 kg)  01/17/16 195 lb 12.8 oz (88.8 kg)

## 2016-03-01 NOTE — Progress Notes (Addendum)
GENETIC TEST RESULTS   Patient Name: Julia Poole Patient Age: 60 y.o. Encounter Date: 03/01/2016  Referring Provider: Eppie Gibson, MD    Julia Poole was seen in the [place] clinic on March 01, 2016 due to a personal and family history of cancer and concern regarding a hereditary predisposition to cancer in the family. Please refer to the prior Genetics clinic note for more information regarding Julia Poole and family histories and our assessment at the time.   FAMILY HISTORY:  We obtained a detailed, 4-generation family history.  Significant diagnoses are listed below: Family History  Problem Relation Age of Onset  . Colon cancer Mother 71  . Diabetes Father   . Heart disease Father   . Cancer Father     skin  . Breast cancer Brother 54  . Cancer Brother     bone mets  . Leukemia Sister 42    CLL  . Cancer Brother     skin  . Ovarian cancer Maternal Aunt     dx in her 48s  . Tuberculosis Maternal Grandmother   . Heart disease Maternal Grandfather   . Diabetes Paternal Grandmother   . Alcohol abuse Paternal Grandfather     The patient has one son who is cancer free.  She ha one sister and two full brothers and a paternal half brother.  Her sister died of CLL at 56, one full brother was diagnosed with breast cancer after a car accident at 36, and a full brother was diagnosed with skin cancer.  The patient's mother was diagnosed with colon cancer at 54 and died at 76.  Her father had several skin cancers.  The patient's mother had a twin sister with ovarian cancer.  There is no other reported family history of cancer.  Patient's maternal ancestors are of [place] descent, and paternal ancestors are of [place] descent. There is no reported Ashkenazi Jewish ancestry. There is no known consanguinity.  GENETIC TESTING:  At the time of Julia Poole visit, we recommended she pursue genetic testing of the Common Hereditary Cancer test. 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 There were no deleterious mutations in ATM, AXIN2, BARD1, BMPR1A, BRCA1, BRCA2, BRIP1, CDH1, CDKN2A, CHEK2, DICER1, EPCAM, GREM1, KIT, MEN1, MLH1, MSH2, MSH6, MUTYH, NBN, NF1, PALB2, PDGFRA, PMS2, POLD1, POLE, PTEN, RAD50, RAD51C, RAD51D, SDHA, SDHB, SDHC, SDHD, SMAD4, SMARCA4. STK11, TP53, TSC1, TSC2, and VHL.     Genetic testing did detect two Variants of Unknown Significance in the APC gene called c.1631T>C and c.6873A>T. At this time, it is unknown if these variants are associated with an increased cancer risk or if this is a normal finding, but most variants such as these get reclassified to being inconsequential. It should not be used to make medical management decisions. With time, we suspect the lab will determine the significance of this variant, if any. If we do learn more about it, we will try to contact Julia Poole to discuss it further. However, it is important to stay in touch with [place] periodically and keep the address and phone number up to date. The APC c.1631T>C and APC c.6873A>T VUSs were both reclassified as Likely Benign.  The updated report date is June 01, 2017.   DISCUSSION: The ATM gene is involved in the detection and surveillance of DNA damage.  ATM phosphorylation of BRCA1 is critical for  proper response to DNA double-strand breaks.  This is believed to be the reason for the role ATM has in breast cancer risk.  Individuals with a ATM mutation are at a greater risk for having children with Ataxia-telangiectasia (A-T). AT is characterized by progressive cerebellar degeneration (ataxia), dilated blood vessels in the eyes and skin (telangiectasia), immunodeficiency, chromosomal instability, increased sensitivity to ionizing radiation and a predisposition to lymphoma and leukemia.  Therefore, individuals of childbearing age  who have a known ATM mutation may want to consider having their spouse tested to determine their risk for having a child with A-T.  Studies of these families demonstrated increased incident of breast cancer in the mothers (who are heterozygous carriers) of the affected children, thus prompting further evaluation of the relationship between breast cancer and ATM.  Women who are heterozygous ATM carriers have an increase breast cancer risk.  They have 5-fold increased risk of breast cancer <50 years, and 2-3 fold increased risk for breast cancer overall.  In families with familial breast cancer that were negative for BRCA1 or BRCA2 genes, approximately 2.7% of women were found to have one ATM mutation.  In families with both breast cancer and leukemia, 6.7% of women were found to have one ATM mutation.  There has been some evidence that radiation treatment may increase the risk for breast cancer in the contralateral breast. Despite this risk, we do not recommend declining radiation treatment for her breast cancer if it is recommended, as the risk for having a recurrence of breast cancer based on not going through radiation may be greater than her risk for getting breast cancer again from the radiation.   Management for individuals with ATM mutations can be found in the NCCN guidelines (v.1.2018). These guidelines recommend the following:  Breast Cancer   Screening: Annual mammogram with consideration of tomosynthesis and consider breast MRI with contrast starting at age 26 years.  Risk Reducing Mastectomy: Evidence insufficient, consider based on family history  Ovarian Cancer  No increased risk for ovarian cancer, therefore no recommendations  Other Cancer Risks  Unknown or insufficient evidence for pancreatic or prostate cancer risk  FAMILY MEMBERS: It is important that all of Julia Poole's relatives (both men and women) know of the presence of this gene mutation. Site-specific genetic testing  can sort out who in the family is at risk and who is not.   Julia Poole son and siblings have a 50% chance to have inherited this mutation. All of her siblings are deceased but we could offer testing to her nieces and nephews.  We recommend they have genetic testing for this same mutation, as identifying the presence of this mutation would allow them to also take advantage of risk-reducing measures.   Ms. Despain was tested through [facility].  First degree relatives who undergo genetic testing within 90 days of her report date can get testing for free.  We have set up an appointment for her son to come in for testing.  We encouraged Ms. Dress to remain in contact with [place] on an annual basis so we can update her personal and family histories, and let her know of advances in cancer genetics that may benefit the family. Our contact number was provided. Ms. Busk questions were answered to her satisfaction today, and she knows she is welcome to call anytime with additional questions.   Ysmael Hires P. Florene Glen, [place], [facility] Certified Genetic Counselor [email or web address] phone: [phone]

## 2016-03-01 NOTE — Progress Notes (Signed)
Radiation Oncology         [phone] ________________________________  Name: Julia Poole MRN: [id number]  Date: 03/01/2016  DOB: 03/02/1956  Follow-Up Visit Note  Outpatient  CC: [name] Done, FNP  Ditty, Kevan Ny, *  Diagnosis:   Atypical meningioma of brain [facility])    ICD-9-CM ICD-10-CM   1. Atypical meningioma of brain ([place]) 237.6 D42.0       CHIEF COMPLAINT: Here for follow-up and surveillance of brain cancer  TREATMENT DATES: 01-31-16                         SITE/DOSE/BEAMS/ENERGY:   Right Frontal Convexity 28mm target was treated using 3 Dynamic Conformal Arcs to a prescription dose of 15 Gy.   Left Frontal Convexity 38mm target was treated using 4 Dynamic Conformal Arcs to a prescription dose of 15 Gy.      6MV FFF photons were used with SRS technique.     TREATMENT DATES: 12-06-15 to 01-19-16                         Site/dose:    1) Falx meningiomas (intact tumor and cavity) / 55.8 Gy in 31 fractions 2) Intact meningioma boost / 3.6 Gy in 2 fractions  Narrative:  The patient returns today for routine follow-up of radiation therapy to the Brain completed on 01/19/16.      Symptomatically, denies auditory changes, nausea, or vomiting. Patient complains of fatigue, but reports it is improving. She also reports continued hair loss. The patient reports occasional visual blurring in both eyes. She reports occasional dull headaches over lower base of skull, but does not feel discomfort is enough to take Tylenol; the headaches occur approximately 1-2 times a week. The patient also reports some back pain with extended periods of walking, for which she takes Tylenol. HAs are mild, not constant.  The patient reports she has a follow up visit scheduled with Dr. Cyndy Freeze next week.    ALLERGIES:  is allergic to norco [hydrocodone-acetaminophen].  Meds: Current Outpatient Prescriptions  Medication Sig Dispense Refill  . acetaminophen-codeine (TYLENOL #3) 300-30 MG tablet  TAKE ONE TABLET BY MOUTH EVERY FOUR HOURS AS NEEDED FOR PAIN 120 tablet 2  . Ascorbic Acid (VITAMIN C) 500 MG tablet Take 500 mg by mouth daily.     Marland Kitchen aspirin 81 MG tablet Take 81 mg by mouth daily.      . Calcium-Vitamin D (CALTRATE 600 PLUS-VIT D PO) Take 600 mg by mouth 2 (two) times daily.    . Cholecalciferol (VITAMIN D) 2000 UNITS tablet Take 2,000 Units by mouth 2 (two) times daily.    . cyanocobalamin 100 MCG tablet Take 100 mcg by mouth daily.    Marland Kitchen FLAXSEED, LINSEED, PO Take by mouth.    Marland Kitchen GARLIC PO Take [id number] mg by mouth daily.    Marland Kitchen levETIRAcetam (KEPPRA) 1000 MG tablet Take 1 tablet (1,000 mg total) by mouth 2 (two) times daily. 60 tablet 5  . levothyroxine (SYNTHROID, LEVOTHROID) 50 MCG tablet Take 1 tablet (50 mcg total) by mouth daily. 90 tablet 1  . Multiple Vitamins-Minerals (CENTRUM SILVER PO) Take by mouth.      Marland Kitchen oxaprozin (DAYPRO) 600 MG tablet Take 300 mg by mouth daily.     . rosuvastatin (CRESTOR) 10 MG tablet Take 1 tablet (10 mg total) by mouth daily. 90 tablet 1  . Wound Dressings (SONAFINE EX) Apply topically.  No current facility-administered medications for this encounter.    Physical Findings: The patient is in no acute distress. Patient is alert and oriented.  oral temperature is 98 F (36.7 C). Her blood pressure is 124/85 and her pulse is 73. Her respiration is 10 and oxygen saturation is 100%. .  No significant changes. General: Alert and oriented, in no acute distress HEENT: Head is normocephalic. Central alopecia but her scalp has healed well. Extraocular movements are intact. Oropharynx and oral cavity are clear. Neurologic: Speech is fluent. Coordination is intact. Psychiatric: Judgment and insight are intact. Affect is appropriate.  Lab Findings: Lab Results  Component Value Date   WBC 20.9 (H) 10/17/2015   HGB 9.7 (L) 10/17/2015   HCT 29.5 (L) 10/17/2015   MCV 92.8 10/17/2015   PLT 307 10/17/2015       Radiographic Findings: No results  found.  Impression/Plan:  We discussed hat and wig options for the patient's alopecia; nursing provided the patient with information on some local wig shops she could visit.  I will have our Nurse Navigator contact Dr. Cyndy Freeze to schedule a f/u and a MRI of brain for the patient in 2 months. She will follow up with me for the subsequent MRI results.  _____________________________________   Eppie Gibson, MD  This document serves as a record of services personally performed by Eppie Gibson, MD. It was created on her behalf by Maryla Morrow, a trained medical scribe. The creation of this record is based on the scribe's personal observations and the provider's statements to them. This document has been checked and approved by the attending provider.

## 2016-03-04 NOTE — Addendum Note (Signed)
Encounter addended by: Ernst Spell, RN on: 03/04/2016 10:39 AM<BR>    Actions taken: Charge Capture section accepted

## 2016-04-24 ENCOUNTER — Other Ambulatory Visit: Payer: Self-pay | Admitting: Radiation Therapy

## 2016-04-24 ENCOUNTER — Telehealth: Payer: Self-pay | Admitting: Radiation Therapy

## 2016-04-24 DIAGNOSIS — D420 Neoplasm of uncertain behavior of cerebral meninges: Secondary | ICD-10-CM

## 2016-04-24 NOTE — Telephone Encounter (Signed)
Opened in error

## 2016-05-02 ENCOUNTER — Ambulatory Visit
Admission: RE | Admit: 2016-05-02 | Discharge: 2016-05-02 | Disposition: A | Discharge status: Home / Self Care | Payer: Medicare Other | Source: Ambulatory Visit | Attending: Radiation Oncology | Admitting: Radiation Oncology

## 2016-05-02 DIAGNOSIS — D420 Neoplasm of uncertain behavior of cerebral meninges: Secondary | ICD-10-CM

## 2016-05-02 DIAGNOSIS — D320 Benign neoplasm of cerebral meninges: Secondary | ICD-10-CM | POA: Diagnosis not present

## 2016-05-02 MED ORDER — GADOBENATE DIMEGLUMINE 529 MG/ML IV SOLN
18.0000 mL | Freq: Once | INTRAVENOUS | Status: AC | PRN
  Administered 2016-05-02: 18 mL via INTRAVENOUS

## 2016-05-09 DIAGNOSIS — D329 Benign neoplasm of meninges, unspecified: Secondary | ICD-10-CM | POA: Diagnosis not present

## 2016-05-27 ENCOUNTER — Encounter: Payer: Self-pay | Admitting: Family Medicine

## 2016-05-27 ENCOUNTER — Ambulatory Visit (INDEPENDENT_AMBULATORY_CARE_PROVIDER_SITE_OTHER): Payer: Medicare Other | Admitting: Family Medicine

## 2016-05-27 VITALS — BP 114/60 | HR 82 | Temp 98.2°F | Ht 65.0 in | Wt 191.0 lb

## 2016-05-27 DIAGNOSIS — J111 Influenza due to unidentified influenza virus with other respiratory manifestations: Secondary | ICD-10-CM

## 2016-05-27 MED ORDER — OSELTAMIVIR PHOSPHATE 75 MG PO CAPS: 75.0000 mg | ORAL_CAPSULE | Freq: Two times a day (BID) | ORAL | 0 refills | Status: DC

## 2016-05-27 MED ORDER — BETAMETHASONE SOD PHOS & ACET 6 (3-3) MG/ML IJ SUSP
6.0000 mg | Freq: Once | INTRAMUSCULAR | Status: AC
  Administered 2016-05-27: 6 mg via INTRAMUSCULAR

## 2016-05-27 NOTE — Progress Notes (Signed)
Subjective:  Patient ID: Julia Poole, female    DOB: 02/18/1956  Age: 61 y.o. MRN: [id number]  CC: Generalized Body Aches (pt here today c/o coughing, skin hurts, body aches.)   HPI Julia Poole presents for  Patient presents with dry cough runny stuffy nose. Husband  noted her wheezing overnight last night. Diffuse headache of moderate intensity. Patient also has chills and subjective fever. Body aches worst in the back but present in the legs, shoulders, and torso as well. Has sapped the energy to the point that of being unable to perform usual activities other than ADLs. Onset 2 days ago.   History Julia Poole has a past medical history of Allergy; Arthritis; Bleeding nose; Cancer ([place]); Cancer ([place]) (09/2015); Degenerative disk disease (2012); Family history of breast cancer in female; Family history of colon cancer; Family history of ovarian cancer; Hemorrhoids; Hyperlipidemia; Sinus problem; and Thyroid disease.   She has a past surgical history that includes Ganglion cyst excision (SEP. 1997); Colonoscopy; Breast surgery (AUG. 2010); Breast surgery; and Craniotomy (Left, 10/16/2015).   Her family history includes Alcohol abuse in her paternal grandfather; Breast cancer (age of onset: 58) in her brother; Cancer in her brother, brother, and father; Colon cancer (age of onset: 47) in her mother; Diabetes in her father and paternal grandmother; Heart disease in her father and maternal grandfather; Leukemia (age of onset: 76) in her sister; Ovarian cancer in her maternal aunt; Tuberculosis in her maternal grandmother.She reports that she has never smoked. She has never used smokeless tobacco. She reports that she does not drink alcohol or use drugs.  Current Outpatient Prescriptions on File Prior to Visit  Medication Sig Dispense Refill  . acetaminophen-codeine (TYLENOL #3) 300-30 MG tablet TAKE ONE TABLET BY MOUTH EVERY FOUR HOURS AS NEEDED FOR PAIN 120 tablet 2  . Ascorbic Acid (VITAMIN C) 500  MG tablet Take 500 mg by mouth daily.     Marland Kitchen aspirin 81 MG tablet Take 81 mg by mouth daily.      . Calcium-Vitamin D (CALTRATE 600 PLUS-VIT D PO) Take 600 mg by mouth 2 (two) times daily.    . Cholecalciferol (VITAMIN D) 2000 UNITS tablet Take 2,000 Units by mouth 2 (two) times daily.    . cyanocobalamin 100 MCG tablet Take 100 mcg by mouth daily.    Marland Kitchen FLAXSEED, LINSEED, PO Take by mouth.    Marland Kitchen GARLIC PO Take [id number] mg by mouth daily.    Marland Kitchen levothyroxine (SYNTHROID, LEVOTHROID) 50 MCG tablet Take 1 tablet (50 mcg total) by mouth daily. 90 tablet 1  . Multiple Vitamins-Minerals (CENTRUM SILVER PO) Take by mouth.      Marland Kitchen oxaprozin (DAYPRO) 600 MG tablet Take 300 mg by mouth daily.     . rosuvastatin (CRESTOR) 10 MG tablet Take 1 tablet (10 mg total) by mouth daily. 90 tablet 1   No current facility-administered medications on file prior to visit.     ROS Review of Systems  Constitutional: Negative for activity change, appetite change, chills and fever.  HENT: Positive for congestion, postnasal drip, rhinorrhea and sinus pressure. Negative for ear discharge, ear pain, hearing loss, nosebleeds, sneezing and trouble swallowing.   Respiratory: Negative for chest tightness and shortness of breath.   Cardiovascular: Negative for chest pain and palpitations.  Skin: Negative for rash.    Objective:  BP 114/60   Pulse 82   Temp 98.2 F (36.8 C) (Oral)   Ht 5\' 5"  (1.651 m)   Wt  191 lb (86.6 kg)   BMI 31.78 kg/m   Physical Exam  Constitutional: She appears well-developed and well-nourished.  HENT:  Head: Normocephalic and atraumatic.  Right Ear: Tympanic membrane and external ear normal. No decreased hearing is noted.  Left Ear: Tympanic membrane and external ear normal. No decreased hearing is noted.  Nose: Mucosal edema present. Right sinus exhibits no frontal sinus tenderness. Left sinus exhibits no frontal sinus tenderness.  Mouth/Throat: No oropharyngeal exudate or posterior  oropharyngeal erythema.  Neck: No Brudzinski's sign noted.  Pulmonary/Chest: Breath sounds normal. No respiratory distress.  Lymphadenopathy:       Head (right side): No preauricular adenopathy present.       Head (left side): No preauricular adenopathy present.       Right cervical: No superficial cervical adenopathy present.      Left cervical: No superficial cervical adenopathy present.    Assessment & Plan:   Julia Poole was seen today for generalized body aches.  Diagnoses and all orders for this visit:  Influenza with respiratory manifestation -     betamethasone acetate-betamethasone sodium phosphate (CELESTONE) injection 6 mg; Inject 1 mL (6 mg total) into the muscle once.  Other orders -     oseltamivir (TAMIFLU) 75 MG capsule; Take 1 capsule (75 mg total) by mouth 2 (two) times daily.   I have discontinued Julia Poole's Wound Dressings (SONAFINE EX) and levETIRAcetam. I am also having her start on oseltamivir. Additionally, I am having her maintain her aspirin, Multiple Vitamins-Minerals (CENTRUM SILVER PO), vitamin C, GARLIC PO, cyanocobalamin, oxaprozin, Vitamin D, Calcium-Vitamin D (CALTRATE 600 PLUS-VIT D PO), (FLAXSEED, LINSEED, PO), rosuvastatin, levothyroxine, and acetaminophen-codeine. We will continue to administer betamethasone acetate-betamethasone sodium phosphate.  Meds ordered this encounter  Medications  . betamethasone acetate-betamethasone sodium phosphate (CELESTONE) injection 6 mg  . oseltamivir (TAMIFLU) 75 MG capsule    Sig: Take 1 capsule (75 mg total) by mouth 2 (two) times daily.    Dispense:  10 capsule    Refill:  0     Follow-up: Return if symptoms worsen or fail to improve.  Claretta Fraise, M.D.

## 2016-06-03 ENCOUNTER — Ambulatory Visit (INDEPENDENT_AMBULATORY_CARE_PROVIDER_SITE_OTHER): Payer: Medicare Other | Admitting: Nurse Practitioner

## 2016-06-03 ENCOUNTER — Encounter: Payer: Self-pay | Admitting: Nurse Practitioner

## 2016-06-03 VITALS — BP 126/76 | HR 75 | Temp 96.9°F | Ht 65.0 in | Wt 189.0 lb

## 2016-06-03 DIAGNOSIS — D420 Neoplasm of uncertain behavior of cerebral meninges: Secondary | ICD-10-CM

## 2016-06-03 DIAGNOSIS — E034 Atrophy of thyroid (acquired): Secondary | ICD-10-CM

## 2016-06-03 DIAGNOSIS — Z1211 Encounter for screening for malignant neoplasm of colon: Secondary | ICD-10-CM

## 2016-06-03 DIAGNOSIS — Z1212 Encounter for screening for malignant neoplasm of rectum: Secondary | ICD-10-CM

## 2016-06-03 DIAGNOSIS — Z6831 Body mass index (BMI) 31.0-31.9, adult: Secondary | ICD-10-CM

## 2016-06-03 DIAGNOSIS — Z13820 Encounter for screening for osteoporosis: Secondary | ICD-10-CM

## 2016-06-03 DIAGNOSIS — E782 Mixed hyperlipidemia: Secondary | ICD-10-CM | POA: Diagnosis not present

## 2016-06-03 MED ORDER — ROSUVASTATIN CALCIUM 10 MG PO TABS: 10.0000 mg | ORAL_TABLET | Freq: Every day | ORAL | 1 refills | Status: DC

## 2016-06-03 MED ORDER — LEVOTHYROXINE SODIUM 50 MCG PO TABS: 50.0000 ug | ORAL_TABLET | Freq: Every day | ORAL | 1 refills | Status: DC

## 2016-06-03 NOTE — Progress Notes (Signed)
Subjective:    Patient ID: Julia Poole, female    DOB: 1955/09/12, 61 y.o.   MRN: [id number]  Patient here today for follow up of chronic medical problems.  Outpatient Encounter Prescriptions as of 06/03/2016  Medication Sig  . acetaminophen-codeine (TYLENOL #3) 300-30 MG tablet TAKE ONE TABLET BY MOUTH EVERY FOUR HOURS AS NEEDED FOR PAIN  . Ascorbic Acid (VITAMIN C) 500 MG tablet Take 500 mg by mouth daily.   Marland Kitchen aspirin 81 MG tablet Take 81 mg by mouth daily.    . Calcium-Vitamin D (CALTRATE 600 PLUS-VIT D PO) Take 600 mg by mouth 2 (two) times daily.  . Cholecalciferol (VITAMIN D) 2000 UNITS tablet Take 2,000 Units by mouth 2 (two) times daily.  . COCONUT OIL PO Take by mouth.  . cyanocobalamin 100 MCG tablet Take 100 mcg by mouth daily.  Marland Kitchen FLAXSEED, LINSEED, PO Take by mouth.  Marland Kitchen GARLIC PO Take 3,845 mg by mouth daily.  . Glucosamine-Chondroitin (OSTEO BI-FLEX REGULAR STRENGTH PO) Take by mouth.  . levothyroxine (SYNTHROID, LEVOTHROID) 50 MCG tablet Take 1 tablet (50 mcg total) by mouth daily.  . Multiple Vitamins-Minerals (CENTRUM SILVER PO) Take by mouth.    Marland Kitchen oxaprozin (DAYPRO) 600 MG tablet Take 300 mg by mouth daily.   . rosuvastatin (CRESTOR) 10 MG tablet Take 1 tablet (10 mg total) by mouth daily.  . TURMERIC PO Take by mouth.  . vitamin E (VITAMIN E) 400 UNIT capsule Take 400 Units by mouth daily.  . [DISCONTINUED] oseltamivir (TAMIFLU) 75 MG capsule Take 1 capsule (75 mg total) by mouth 2 (two) times daily.   No facility-administered encounter medications on file as of 06/03/2016.     Hyperlipidemia  This is a chronic problem. Recent lipid tests were reviewed and are variable. Exacerbating diseases include obesity. She has no history of diabetes or hypothyroidism. Pertinent negatives include no myalgias. Current antihyperlipidemic treatment includes statins. The current treatment provides no improvement of lipids. Compliance problems include adherence to diet and adherence to  exercise.   Thyroid Problem  Patient reports no cold intolerance, constipation, diaphoresis, menstrual problem, palpitations, tremors or visual change. Her past medical history is significant for hyperlipidemia. There is no history of diabetes.  Chronic Low Back Pain/ spondylolisthesis Patient sees Dr. Louanne Skye & Dr. Carloyn Manner- Slipped vertebra- waiting for slippage to worsen before doing surgery. He does no do pain medication. Atypical meningioma of brain She had emergency brain surgery  Back in July and has just finished having radiation treatments. SHe is doing well. Denies any confusion, dizziness, etc. SHe is very tired but otherwise doing well. No residual affects from surgery. Denies headaches.  Review of Systems  Constitutional: Negative for diaphoresis.  Cardiovascular: Negative for palpitations.  Gastrointestinal: Negative for constipation.  Endocrine: Negative for cold intolerance.  Genitourinary: Negative for menstrual problem.  Musculoskeletal: Positive for back pain. Negative for myalgias.  Neurological: Negative for tremors.  All other systems reviewed and are negative.      Objective:   Physical Exam  Constitutional: She is oriented to person, place, and time. She appears well-developed and well-nourished.  HENT:  Nose: Nose normal.  Mouth/Throat: Oropharynx is clear and moist.  Eyes: EOM are normal.  Neck: Trachea normal, normal range of motion and full passive range of motion without pain. Neck supple. No JVD present. Carotid bruit is not present. No thyromegaly present.  Cardiovascular: Normal rate, regular rhythm, normal heart sounds and intact distal pulses.  Exam reveals no gallop and no friction  rub.   No murmur heard. Pulmonary/Chest: Effort normal and breath sounds normal.  Abdominal: Soft. Bowel sounds are normal. She exhibits no distension and no mass. There is no tenderness.  Musculoskeletal: Normal range of motion.  Slowly rises from laying position due to back  pain.  Lymphadenopathy:    She has no cervical adenopathy.  Neurological: She is alert and oriented to person, place, and time. She has normal reflexes. No cranial nerve deficit.  Skin: Skin is warm and dry.  Psychiatric: She has a normal mood and affect. Her behavior is normal. Judgment and thought content normal.    BP 126/76   Pulse 75   Temp (!) 96.9 F (36.1 C) (Oral)   Ht '5\' 5"'  (1.651 m)   Wt 189 lb (85.7 kg)   BMI 31.45 kg/m        Assessment & Plan:  1. Hypothyroidism due to acquired atrophy of thyroid - levothyroxine (SYNTHROID, LEVOTHROID) 50 MCG tablet; Take 1 tablet (50 mcg total) by mouth daily.  Dispense: 90 tablet; Refill: 1  2. Atypical meningioma of brain [facility]) keep follow up appointment with neurologist  3. BMI 31.0-31.9,adult Discussed diet and exercise for person with BMI >25 Will recheck weight in 3-6 months  4. Mixed hyperlipidemia Low fta diet - rosuvastatin (CRESTOR) 10 MG tablet; Take 1 tablet (10 mg total) by mouth daily.  Dispense: 90 tablet; Refill: 1 - CMP14+EGFR - Lipid panel    Labs pending Health maintenance reviewed Diet and exercise encouraged Continue all meds Follow up  In 6 month   [place], FNP

## 2016-06-03 NOTE — Patient Instructions (Signed)
Health Maintenance, Female Adopting a healthy lifestyle and getting preventive care can go a long way to promote health and wellness. Talk with your health care provider about what schedule of regular examinations is right for you. This is a good chance for you to check in with your provider about disease prevention and staying healthy. In between checkups, there are plenty of things you can do on your own. Experts have done a lot of research about which lifestyle changes and preventive measures are most likely to keep you healthy. Ask your health care provider for more information. Weight and diet Eat a healthy diet  Be sure to include plenty of vegetables, fruits, low-fat dairy products, and lean protein.  Do not eat a lot of foods high in solid fats, added sugars, or salt.  Get regular exercise. This is one of the most important things you can do for your health.  Most adults should exercise for at least 150 minutes each week. The exercise should increase your heart rate and make you sweat (moderate-intensity exercise).  Most adults should also do strengthening exercises at least twice a week. This is in addition to the moderate-intensity exercise. Maintain a healthy weight  Body mass index (BMI) is a measurement that can be used to identify possible weight problems. It estimates body fat based on height and weight. Your health care provider can help determine your BMI and help you achieve or maintain a healthy weight.  For females 76 years of age and older:  A BMI below 18.5 is considered underweight.  A BMI of 18.5 to 24.9 is normal.  A BMI of 25 to 29.9 is considered overweight.  A BMI of 30 and above is considered obese. Watch levels of cholesterol and blood lipids  You should start having your blood tested for lipids and cholesterol at 63 years of age, then have this test every 5 years.  You may need to have your cholesterol levels checked more often if:  Your lipid or  cholesterol levels are high.  You are older than 63 years of age.  You are at high risk for heart disease. Cancer screening Lung Cancer  Lung cancer screening is recommended for adults 64-42 years old who are at high risk for lung cancer because of a history of smoking.  A yearly low-dose CT scan of the lungs is recommended for people who:  Currently smoke.  Have quit within the past 15 years.  Have at least a 30-pack-year history of smoking. A pack year is smoking an average of one pack of cigarettes a day for 1 year.  Yearly screening should continue until it has been 15 years since you quit.  Yearly screening should stop if you develop a health problem that would prevent you from having lung cancer treatment. Breast Cancer  Practice breast self-awareness. This means understanding how your breasts normally appear and feel.  It also means doing regular breast self-exams. Let your health care provider know about any changes, no matter how small.  If you are in your 20s or 30s, you should have a clinical breast exam (CBE) by a health care provider every 1-3 years as part of a regular health exam.  If you are 34 or older, have a CBE every year. Also consider having a breast X-ray (mammogram) every year.  If you have a family history of breast cancer, talk to your health care provider about genetic screening.  If you are at high risk for breast cancer, talk  to your health care provider about having an MRI and a mammogram every year.  Breast cancer gene (BRCA) assessment is recommended for women who have family members with BRCA-related cancers. BRCA-related cancers include:  Breast.  Ovarian.  Tubal.  Peritoneal cancers.  Results of the assessment will determine the need for genetic counseling and BRCA1 and BRCA2 testing. Cervical Cancer  Your health care provider may recommend that you be screened regularly for cancer of the pelvic organs (ovaries, uterus, and vagina).  This screening involves a pelvic examination, including checking for microscopic changes to the surface of your cervix (Pap test). You may be encouraged to have this screening done every 3 years, beginning at age 24.  For women ages 66-65, health care providers may recommend pelvic exams and Pap testing every 3 years, or they may recommend the Pap and pelvic exam, combined with testing for human papilloma virus (HPV), every 5 years. Some types of HPV increase your risk of cervical cancer. Testing for HPV may also be done on women of any age with unclear Pap test results.  Other health care providers may not recommend any screening for nonpregnant women who are considered low risk for pelvic cancer and who do not have symptoms. Ask your health care provider if a screening pelvic exam is right for you.  If you have had past treatment for cervical cancer or a condition that could lead to cancer, you need Pap tests and screening for cancer for at least 20 years after your treatment. If Pap tests have been discontinued, your risk factors (such as having a new sexual partner) need to be reassessed to determine if screening should resume. Some women have medical problems that increase the chance of getting cervical cancer. In these cases, your health care provider may recommend more frequent screening and Pap tests. Colorectal Cancer  This type of cancer can be detected and often prevented.  Routine colorectal cancer screening usually begins at 63 years of age and continues through 63 years of age.  Your health care provider may recommend screening at an earlier age if you have risk factors for colon cancer.  Your health care provider may also recommend using home test kits to check for hidden blood in the stool.  A small camera at the end of a tube can be used to examine your colon directly (sigmoidoscopy or colonoscopy). This is done to check for the earliest forms of colorectal cancer.  Routine  screening usually begins at age 41.  Direct examination of the colon should be repeated every 5-10 years through 63 years of age. However, you may need to be screened more often if early forms of precancerous polyps or small growths are found. Skin Cancer  Check your skin from head to toe regularly.  Tell your health care provider about any new moles or changes in moles, especially if there is a change in a mole's shape or color.  Also tell your health care provider if you have a mole that is larger than the size of a pencil eraser.  Always use sunscreen. Apply sunscreen liberally and repeatedly throughout the day.  Protect yourself by wearing long sleeves, pants, a wide-brimmed hat, and sunglasses whenever you are outside. Heart disease, diabetes, and high blood pressure  High blood pressure causes heart disease and increases the risk of stroke. High blood pressure is more likely to develop in:  People who have blood pressure in the high end of the normal range (130-139/85-89 mm Hg).  People who are overweight or obese.  People who are African American.  If you are 59-24 years of age, have your blood pressure checked every 3-5 years. If you are 34 years of age or older, have your blood pressure checked every year. You should have your blood pressure measured twice-once when you are at a hospital or clinic, and once when you are not at a hospital or clinic. Record the average of the two measurements. To check your blood pressure when you are not at a hospital or clinic, you can use:  An automated blood pressure machine at a pharmacy.  A home blood pressure monitor.  If you are between 29 years and 60 years old, ask your health care provider if you should take aspirin to prevent strokes.  Have regular diabetes screenings. This involves taking a blood sample to check your fasting blood sugar level.  If you are at a normal weight and have a low risk for diabetes, have this test once  every three years after 63 years of age.  If you are overweight and have a high risk for diabetes, consider being tested at a younger age or more often. Preventing infection Hepatitis B  If you have a higher risk for hepatitis B, you should be screened for this virus. You are considered at high risk for hepatitis B if:  You were born in a country where hepatitis B is common. Ask your health care provider which countries are considered high risk.  Your parents were born in a high-risk country, and you have not been immunized against hepatitis B (hepatitis B vaccine).  You have HIV or AIDS.  You use needles to inject street drugs.  You live with someone who has hepatitis B.  You have had sex with someone who has hepatitis B.  You get hemodialysis treatment.  You take certain medicines for conditions, including cancer, organ transplantation, and autoimmune conditions. Hepatitis C  Blood testing is recommended for:  Everyone born from [birth year] through 1967.  Anyone with known risk factors for hepatitis C. Sexually transmitted infections (STIs)  You should be screened for sexually transmitted infections (STIs) including gonorrhea and chlamydia if:  You are sexually active and are younger than 63 years of age.  You are older than 63 years of age and your health care provider tells you that you are at risk for this type of infection.  Your sexual activity has changed since you were last screened and you are at an increased risk for chlamydia or gonorrhea. Ask your health care provider if you are at risk.  If you do not have HIV, but are at risk, it may be recommended that you take a prescription medicine daily to prevent HIV infection. This is called pre-exposure prophylaxis (PrEP). You are considered at risk if:  You are sexually active and do not regularly use condoms or know the HIV status of your partner(s).  You take drugs by injection.  You are sexually active with a partner  who has HIV. Talk with your health care provider about whether you are at high risk of being infected with HIV. If you choose to begin PrEP, you should first be tested for HIV. You should then be tested every 3 months for as long as you are taking PrEP. Pregnancy  If you are premenopausal and you may become pregnant, ask your health care provider about preconception counseling.  If you may become pregnant, take 400 to 800 micrograms (mcg) of folic acid  every day.  If you want to prevent pregnancy, talk to your health care provider about birth control (contraception). Osteoporosis and menopause  Osteoporosis is a disease in which the bones lose minerals and strength with aging. This can result in serious bone fractures. Your risk for osteoporosis can be identified using a bone density scan.  If you are 4 years of age or older, or if you are at risk for osteoporosis and fractures, ask your health care provider if you should be screened.  Ask your health care provider whether you should take a calcium or vitamin D supplement to lower your risk for osteoporosis.  Menopause may have certain physical symptoms and risks.  Hormone replacement therapy may reduce some of these symptoms and risks. Talk to your health care provider about whether hormone replacement therapy is right for you. Follow these instructions at home:  Schedule regular health, dental, and eye exams.  Stay current with your immunizations.  Do not use any tobacco products including cigarettes, chewing tobacco, or electronic cigarettes.  If you are pregnant, do not drink alcohol.  If you are breastfeeding, limit how much and how often you drink alcohol.  Limit alcohol intake to no more than 1 drink per day for nonpregnant women. One drink equals 12 ounces of beer, 5 ounces of wine, or 1 ounces of hard liquor.  Do not use street drugs.  Do not share needles.  Ask your health care provider for help if you need support  or information about quitting drugs.  Tell your health care provider if you often feel depressed.  Tell your health care provider if you have ever been abused or do not feel safe at home. This information is not intended to replace advice given to you by your health care provider. Make sure you discuss any questions you have with your health care provider. Document Released: 10/01/2010 Document Revised: 08/24/2015 Document Reviewed: 12/20/2014 Elsevier Interactive Patient Education  2017 [employer].

## 2016-06-04 LAB — CMP14+EGFR
A/G RATIO: 2.1 (ref 1.2–2.2)
ALBUMIN: 4.4 g/dL (ref 3.6–4.8)
ALT: 24 IU/L (ref 0–32)
AST: 19 IU/L (ref 0–40)
Alkaline Phosphatase: 89 IU/L (ref 39–117)
BUN / CREAT RATIO: 19 (ref 12–28)
BUN: 13 mg/dL (ref 8–27)
Bilirubin Total: 0.9 mg/dL (ref 0.0–1.2)
CALCIUM: 9.7 mg/dL (ref 8.7–10.3)
CO2: 27 mmol/L (ref 18–29)
Chloride: 99 mmol/L (ref 96–106)
Creatinine, Ser: 0.67 mg/dL (ref 0.57–1.00)
GFR calc non Af Amer: 95 mL/min/{1.73_m2} (ref >59)
GFR, EST AFRICAN AMERICAN: 109 mL/min/{1.73_m2} (ref >59)
Globulin, Total: 2.1 g/dL (ref 1.5–4.5)
Glucose: 91 mg/dL (ref 65–99)
POTASSIUM: 4.8 mmol/L (ref 3.5–5.2)
Sodium: 140 mmol/L (ref 134–144)
Total Protein: 6.5 g/dL (ref 6.0–8.5)

## 2016-06-04 LAB — LIPID PANEL
CHOL/HDL RATIO: 3 ratio (ref 0.0–4.4)
CHOLESTEROL TOTAL: 151 mg/dL (ref 100–199)
HDL: 51 mg/dL (ref >39)
LDL Calculated: 81 mg/dL (ref 0–99)
TRIGLYCERIDES: 93 mg/dL (ref 0–149)
VLDL Cholesterol Cal: 19 mg/dL (ref 5–40)

## 2016-06-07 ENCOUNTER — Encounter: Payer: Self-pay | Admitting: Gastroenterology

## 2016-06-10 ENCOUNTER — Ambulatory Visit (INDEPENDENT_AMBULATORY_CARE_PROVIDER_SITE_OTHER): Payer: Medicare Other | Admitting: Specialist

## 2016-06-12 ENCOUNTER — Encounter (HOSPITAL_COMMUNITY): Payer: Self-pay

## 2016-06-24 ENCOUNTER — Other Ambulatory Visit: Payer: Self-pay | Admitting: Nurse Practitioner

## 2016-06-24 DIAGNOSIS — E785 Hyperlipidemia, unspecified: Secondary | ICD-10-CM

## 2016-06-28 ENCOUNTER — Other Ambulatory Visit: Payer: Self-pay | Admitting: Nurse Practitioner

## 2016-06-28 ENCOUNTER — Other Ambulatory Visit: Payer: Self-pay | Admitting: Neurological Surgery

## 2016-06-28 DIAGNOSIS — E034 Atrophy of thyroid (acquired): Secondary | ICD-10-CM

## 2016-06-28 DIAGNOSIS — D329 Benign neoplasm of meninges, unspecified: Secondary | ICD-10-CM

## 2016-07-26 ENCOUNTER — Telehealth: Payer: Self-pay | Admitting: *Deleted

## 2016-07-26 ENCOUNTER — Ambulatory Visit (AMBULATORY_SURGERY_CENTER): Payer: Self-pay | Admitting: *Deleted

## 2016-07-26 VITALS — Ht 65.0 in | Wt 192.4 lb

## 2016-07-26 DIAGNOSIS — Z800 Family history of malignant neoplasm of digestive organs: Secondary | ICD-10-CM

## 2016-07-26 MED ORDER — NA SULFATE-K SULFATE-MG SULF 17.5-3.13-1.6 GM/177ML PO SOLN: ORAL | 0 refills | Status: DC

## 2016-07-26 NOTE — Telephone Encounter (Signed)
Dr Havery Moros: pt is scheduled for recall colonoscopy 08/09/16.  She had surgery for brain tumor July 2017.  Finished radiation treatments November 2017.  Is she okay for direct colonoscopy?  Thanks, Juliann Pulse in [facility]

## 2016-07-26 NOTE — Progress Notes (Signed)
No allergies to eggs or soy. No problems with anesthesia.  Pt given Emmi instructions for colonoscopy  No oxygen use  No diet drug use  

## 2016-07-26 NOTE — Telephone Encounter (Signed)
Looks like she had a meningioma treated.  As long as she isn't having any seizures as complication from her therapy, and no evidence of metastatic malignancy for which she is on chemotherapy, I think it is okay to proceed directly with colonoscopy. Thanks

## 2016-07-29 NOTE — Telephone Encounter (Signed)
Spoke with patient. She states she has never been on chemo and does not have metastatic malignancy, she was treated for benign meningioma. Patient also denies ever having any seizures, she states she was on the seizure medications for about 3 months after surgery as preventative. Patient then states she is going to call her brain surgeon to make sure it's okay to have the colonoscopy and she will call me back with that information.

## 2016-07-30 ENCOUNTER — Ambulatory Visit
Admission: RE | Admit: 2016-07-30 | Discharge: 2016-07-30 | Disposition: A | Discharge status: Home / Self Care | Payer: Medicare Other | Source: Ambulatory Visit | Attending: Neurological Surgery | Admitting: Neurological Surgery

## 2016-07-30 DIAGNOSIS — D329 Benign neoplasm of meninges, unspecified: Secondary | ICD-10-CM | POA: Diagnosis not present

## 2016-07-30 MED ORDER — GADOBENATE DIMEGLUMINE 529 MG/ML IV SOLN
18.0000 mL | Freq: Once | INTRAVENOUS | Status: AC | PRN
  Administered 2016-07-30: 18 mL via INTRAVENOUS

## 2016-07-31 NOTE — Telephone Encounter (Signed)
Spoke with patient. She states her neurologist is okay with her having the colonoscopy as planned. She states she had MRI done yesterday and is to see neurologist on May 10. Instructed patient to call us back if anything changes in her medical hx.

## 2016-08-01 ENCOUNTER — Ambulatory Visit (INDEPENDENT_AMBULATORY_CARE_PROVIDER_SITE_OTHER): Payer: Medicare Other | Admitting: Specialist

## 2016-08-08 DIAGNOSIS — D329 Benign neoplasm of meninges, unspecified: Secondary | ICD-10-CM | POA: Diagnosis not present

## 2016-08-09 ENCOUNTER — Encounter: Payer: Self-pay | Admitting: Gastroenterology

## 2016-08-09 ENCOUNTER — Ambulatory Visit (AMBULATORY_SURGERY_CENTER): Payer: Medicare Other | Admitting: Gastroenterology

## 2016-08-09 VITALS — BP 133/72 | HR 68 | Temp 98.6°F | Resp 16 | Ht 65.0 in | Wt 192.0 lb

## 2016-08-09 DIAGNOSIS — Z800 Family history of malignant neoplasm of digestive organs: Secondary | ICD-10-CM

## 2016-08-09 DIAGNOSIS — D126 Benign neoplasm of colon, unspecified: Secondary | ICD-10-CM

## 2016-08-09 DIAGNOSIS — Z1212 Encounter for screening for malignant neoplasm of rectum: Secondary | ICD-10-CM | POA: Diagnosis not present

## 2016-08-09 DIAGNOSIS — Z86010 Personal history of colonic polyps: Secondary | ICD-10-CM | POA: Diagnosis not present

## 2016-08-09 DIAGNOSIS — K635 Polyp of colon: Secondary | ICD-10-CM

## 2016-08-09 DIAGNOSIS — Z1211 Encounter for screening for malignant neoplasm of colon: Secondary | ICD-10-CM | POA: Diagnosis not present

## 2016-08-09 DIAGNOSIS — D123 Benign neoplasm of transverse colon: Secondary | ICD-10-CM

## 2016-08-09 DIAGNOSIS — D122 Benign neoplasm of ascending colon: Secondary | ICD-10-CM

## 2016-08-09 MED ORDER — SODIUM CHLORIDE 0.9 % IV SOLN: 500.0000 mL | INTRAVENOUS | Status: DC

## 2016-08-09 NOTE — Progress Notes (Signed)
A/ox3 pleased with MAC, report to Celia RN 

## 2016-08-09 NOTE — Progress Notes (Signed)
Pt's states no medical or surgical changes since previsit or office visit. 

## 2016-08-09 NOTE — Progress Notes (Signed)
Called to room to assist during endoscopic procedure.  Patient ID and intended procedure confirmed with present staff. Received instructions for my participation in the procedure from the performing physician.  

## 2016-08-09 NOTE — Progress Notes (Signed)
Patient stated that she is able to have bp taken and IV's on her right side although she has had stage 0 breast cancer.

## 2016-08-09 NOTE — Patient Instructions (Signed)
Discharge instructions given. Handout on polyps. Resume previous medications. No ibuprofen, naproxen, or other NSAIDs for two weeks. YOU HAD AN ENDOSCOPIC PROCEDURE TODAY AT THE [facility]:   Refer to the procedure report that was given to you for any specific questions about what was found during the examination.  If the procedure report does not answer your questions, please call your gastroenterologist to clarify.  If you requested that your care partner not be given the details of your procedure findings, then the procedure report has been included in a sealed envelope for you to review at your convenience later.  YOU SHOULD EXPECT: Some feelings of bloating in the abdomen. Passage of more gas than usual.  Walking can help get rid of the air that was put into your GI tract during the procedure and reduce the bloating. If you had a lower endoscopy (such as a colonoscopy or flexible sigmoidoscopy) you may notice spotting of blood in your stool or on the toilet paper. If you underwent a bowel prep for your procedure, you may not have a normal bowel movement for a few days.  Please Note:  You might notice some irritation and congestion in your nose or some drainage.  This is from the oxygen used during your procedure.  There is no need for concern and it should clear up in a day or so.  SYMPTOMS TO REPORT IMMEDIATELY:   Following lower endoscopy (colonoscopy or flexible sigmoidoscopy):  Excessive amounts of blood in the stool  Significant tenderness or worsening of abdominal pains  Swelling of the abdomen that is new, acute  Fever of 100F or higher  For urgent or emergent issues, a gastroenterologist can be reached at any hour by calling [phone].   DIET:  We do recommend a small meal at first, but then you may proceed to your regular diet.  Drink plenty of fluids but you should avoid alcoholic beverages for 24 hours.  ACTIVITY:  You should plan to take it easy for the  rest of today and you should NOT DRIVE or use heavy machinery until tomorrow (because of the sedation medicines used during the test).    FOLLOW UP: Our staff will call the number listed on your records the next business day following your procedure to check on you and address any questions or concerns that you may have regarding the information given to you following your procedure. If we do not reach you, we will leave a message.  However, if you are feeling well and you are not experiencing any problems, there is no need to return our call.  We will assume that you have returned to your regular daily activities without incident.  If any biopsies were taken you will be contacted by phone or by letter within the next 1-3 weeks.  Please call us at [phone] if you have not heard about the biopsies in 3 weeks.    SIGNATURES/CONFIDENTIALITY: You and/or your care partner have signed paperwork which will be entered into your electronic medical record.  These signatures attest to the fact that that the information above on your After Visit Summary has been reviewed and is understood.  Full responsibility of the confidentiality of this discharge information lies with you and/or your care-partner. 

## 2016-08-09 NOTE — Op Note (Signed)
[facility] Patient Name: Julia Poole Procedure Date: 08/09/2016 11:14 AM MRN: [id number] Endoscopist: Remo Lipps P. Marciano Mundt MD, MD Age: 61 Referring MD:  Date of Birth: 1955/07/18 Gender: Female Account #: [id number] Procedure:                Colonoscopy Indications:              Screening in patient at increased risk: Family                            history of 1st-degree relative with colorectal                            cancer (mother age 4s) Medicines:                Monitored Anesthesia Care Procedure:                Pre-Anesthesia Assessment:                           - Prior to the procedure, a History and Physical                            was performed, and patient medications and                            allergies were reviewed. The patient's tolerance of                            previous anesthesia was also reviewed. The risks                            and benefits of the procedure and the sedation                            options and risks were discussed with the patient.                            All questions were answered, and informed consent                            was obtained. Prior Anticoagulants: The patient has                            taken aspirin, last dose was 1 day prior to                            procedure. ASA Grade Assessment: II - A patient                            with mild systemic disease. After reviewing the                            risks and benefits, the patient was deemed in  satisfactory condition to undergo the procedure.                           After obtaining informed consent, the colonoscope                            was passed under direct vision. Throughout the                            procedure, the patient's blood pressure, pulse, and                            oxygen saturations were monitored continuously. The                            Model PCF-[id number]) scope  was introduced                            through the anus and advanced to the the terminal                            ileum, with identification of the appendiceal                            orifice and IC valve. The colonoscopy was performed                            without difficulty. The patient tolerated the                            procedure well. The quality of the bowel                            preparation was good. The terminal ileum, ileocecal                            valve, appendiceal orifice, and rectum were                            photographed. Scope In: 11:24:15 AM Scope Out: 11:46:26 AM Scope Withdrawal Time: 0 hours 17 minutes 20 seconds  Total Procedure Duration: 0 hours 22 minutes 11 seconds  Findings:                 The perianal and digital rectal examinations were                            normal.                           The terminal ileum appeared normal.                           A 8 mm polyp was found in the ascending colon. The  polyp was flat. The polyp was removed with a cold                            snare. Resection and retrieval were complete.                           A 3 mm polyp was found in the transverse colon. The                            polyp was sessile. The polyp was removed with a                            cold snare. Resection and retrieval were complete.                           The colon was tortuous.                           The exam was otherwise without abnormality. Complications:            No immediate complications. Estimated blood loss:                            Minimal. Estimated Blood Loss:     Estimated blood loss was minimal. Impression:               - The examined portion of the ileum was normal.                           - One 8 mm polyp in the ascending colon, removed                            with a cold snare. Resected and retrieved.                           - One 3 mm polyp in  the transverse colon, removed                            with a cold snare. Resected and retrieved.                           - Tortuous colon.                           - The examination was otherwise normal. Recommendation:           - Patient has a contact number available for                            emergencies. The signs and symptoms of potential                            delayed complications were discussed with the  patient. Return to normal activities tomorrow.                            Written discharge instructions were provided to the                            patient.                           - Resume previous diet.                           - Continue present medications.                           - Await pathology results.                           - Repeat colonoscopy is recommended in 5 years                            regardless of pathology results given family                            history of colon cancer                           - No ibuprofen, naproxen, or other non-steroidal                            anti-inflammatory drugs for 2 weeks after polyp                            removal. Remo Lipps P. Reshaun Briseno MD, MD 08/09/2016 11:51:38 AM This report has been signed electronically.

## 2016-08-12 ENCOUNTER — Telehealth: Payer: Self-pay

## 2016-08-12 NOTE — Telephone Encounter (Signed)
  Follow up Call-  Call back number 08/09/2016  Post procedure Call Back phone  # [phone]  Permission to leave phone message Yes  Some recent data might be hidden     Patient questions:  Do you have a fever, pain , or abdominal swelling? No. Pain Score  0 *  Have you tolerated food without any problems? Yes.    Have you been able to return to your normal activities? Yes.    Do you have any questions about your discharge instructions: Diet   No. Medications  No. Follow up visit  No.  Do you have questions or concerns about your Care? No.  Actions: * If pain score is 4 or above: No action needed, pain <4.

## 2016-08-13 ENCOUNTER — Encounter: Payer: Self-pay | Admitting: Gastroenterology

## 2016-08-20 DIAGNOSIS — M47816 Spondylosis without myelopathy or radiculopathy, lumbar region: Secondary | ICD-10-CM | POA: Diagnosis not present

## 2016-08-20 DIAGNOSIS — M4316 Spondylolisthesis, lumbar region: Secondary | ICD-10-CM | POA: Diagnosis not present

## 2016-08-22 ENCOUNTER — Other Ambulatory Visit: Payer: Self-pay | Admitting: Neurological Surgery

## 2016-08-22 DIAGNOSIS — D329 Benign neoplasm of meninges, unspecified: Secondary | ICD-10-CM

## 2016-08-28 ENCOUNTER — Ambulatory Visit (INDEPENDENT_AMBULATORY_CARE_PROVIDER_SITE_OTHER): Payer: Medicare Other | Admitting: *Deleted

## 2016-08-28 VITALS — BP 121/72 | HR 73 | Temp 98.4°F | Ht 64.0 in | Wt 192.0 lb

## 2016-08-28 DIAGNOSIS — Z0000 Encounter for general adult medical examination without abnormal findings: Secondary | ICD-10-CM | POA: Diagnosis not present

## 2016-08-28 DIAGNOSIS — Z780 Asymptomatic menopausal state: Secondary | ICD-10-CM

## 2016-08-28 NOTE — Progress Notes (Signed)
Subjective:   Julia Poole is a 61 y.o. female who presents for an Initial Medicare Annual Wellness Visit.  Julia Poole is retired from [employer] of 24 years. She lives at home with her husband and son.  Julia Poole enjoys working in the yard and walking daily for at least 30 minutes daily. She has multiple chickens, 4 cats and a dog that she enjoys taking care of.  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  Julia Poole eats three meals daily that consist of cereal, salads and soups.  Over all [name] feels that her health is better that it was a year ago.   Objective:    Today's Vitals   08/28/16 1622  BP: 121/72  Pulse: 73  Temp: 98.4 F (36.9 C)  TempSrc: Oral  Weight: 192 lb (87.1 kg)  Height: 5\' 4"  (1.626 m)   Body mass index is 32.96 kg/m.   Current Medications (verified) Outpatient Encounter Prescriptions as of 08/28/2016  Medication Sig  . acetaminophen-codeine (TYLENOL #3) 300-30 MG tablet TAKE ONE TABLET BY MOUTH EVERY FOUR HOURS AS NEEDED FOR PAIN  . Ascorbic Acid (VITAMIN C) 500 MG tablet Take 500 mg by mouth daily.   Marland Kitchen aspirin 81 MG tablet Take 81 mg by mouth daily.    . Calcium-Vitamin D (CALTRATE 600 PLUS-VIT D PO) Take 600 mg by mouth 2 (two) times daily.  . Cholecalciferol (VITAMIN D) 2000 UNITS tablet Take 2,000 Units by mouth 2 (two) times daily.  . COCONUT OIL PO Take by mouth.  . cyanocobalamin 100 MCG tablet Take 100 mcg by mouth daily.  Marland Kitchen FLAXSEED, LINSEED, PO Take by mouth.  Marland Kitchen GARLIC PO Take 7,096 mg by mouth daily.  . Glucosamine-Chondroitin (OSTEO BI-FLEX REGULAR STRENGTH PO) Take by mouth.  . levothyroxine (SYNTHROID, LEVOTHROID) 50 MCG tablet Take 1 Tablet by mouth once daily  . Multiple Vitamins-Minerals (CENTRUM SILVER PO) Take by mouth.    Marland Kitchen oxaprozin (DAYPRO) 600 MG tablet Take 300 mg by mouth daily.   . rosuvastatin (CRESTOR) 10 MG tablet Take 1 tablet (10 mg total) by mouth daily.  . TURMERIC PO Take by  mouth.  . vitamin E (VITAMIN E) 400 UNIT capsule Take 400 Units by mouth daily.   Facility-Administered Encounter Medications as of 08/28/2016  Medication  . 0.9 %  sodium chloride infusion    Allergies (verified) Norco [hydrocodone-acetaminophen]   History: Past Medical History:  Diagnosis Date  . Allergy   . Arthritis   . Bleeding nose   . Cancer [facility]) 2010   right breast  . Cancer ([place]) 09/2015   brain  . Cataract   . Degenerative disk disease 2012  . Family history of breast cancer in female   . Family history of colon cancer   . Family history of ovarian cancer   . Hemorrhoids   . Hyperlipidemia   . Sinus problem   . Thyroid disease    Past Surgical History:  Procedure Laterality Date  . BRAIN SURGERY    . BREAST SURGERY  AUG. 2010   right breast lumpectomy  . BREAST SURGERY     Cancer  . COLONOSCOPY    . CRANIOTOMY Left 10/16/2015   Procedure: PARA-SAGITTAL CRANIOTOMY WITH STEREOTACTIC NAVIGATION FOR TUMOR;  Surgeon: Kevan Ny Ditty, MD;  Location: [place] [facility]URO ORS;  Service: Neurosurgery;  Laterality: Left;  . GANGLION CYST EXCISION  SEP. 1997   RIGHT WRIST   Family History  Problem  Relation Age of Onset  . Colon cancer Mother 51  . Diabetes Father   . Heart disease Father   . Cancer Father        skin  . Breast cancer Brother 28  . Cancer Brother        bone mets  . Leukemia Sister 37       CLL  . Cancer Brother        skin  . Ovarian cancer Maternal Aunt        dx in her 59s  . Tuberculosis Maternal Grandmother   . Heart disease Maternal Grandfather   . Diabetes Paternal Grandmother   . Alcohol abuse Paternal Grandfather   . Esophageal cancer Neg Hx   . Rectal cancer Neg Hx   . Stomach cancer Neg Hx    Social History   Occupational History  . Not on file.   Social History Main Topics  . Smoking status: Never Smoker  . Smokeless tobacco: Never Used  . Alcohol use No  . Drug use: No  . Sexual activity: Yes    Birth control/  protection: Post-menopausal    Tobacco Counseling Never a smoker.  No smokeless tobacco use.   Activities of Daily Living In your present state of health, do you have any difficulty performing the following activities: 08/28/2016 10/16/2015  Hearing? N N  Vision? N N  Difficulty concentrating or making decisions? Tempie Donning  Walking or climbing stairs? N N  Dressing or bathing? N N  Doing errands, shopping? N N  Some recent data might be hidden  Patient has difficulty remembering some things due to Brain surgery 6 months ago.  Immunizations and Health Maintenance Immunization History  Administered Date(s) Administered  . Influenza,inj,Quad PF,36+ Mos 01/01/2013, 01/19/2014, 02/09/2015, 02/26/2016  . Influenza-Unspecified 12/30/2013  . Zoster 05/19/2015  Declined Tetanus vaccine at this time  There are no preventive care reminders to display for this patient.  Patient Care Team: Chevis Pretty, FNP as PCP - General (Nurse Practitioner) Glenna Fellows, MD as Attending Physician (Neurosurgery) Ditty, Kevan Ny, MD as Consulting Physician (Neurosurgery) Merilyn Baba, MD as Referring Physician (Internal Medicine) Armbruster, Renelda Loma, MD as Consulting Physician (Gastroenterology)  Indicate any recent Medical Services you may have received from other than [facility] providers in the past year (date may be approximate).     Assessment:   This is a routine wellness examination for Mikiah.   Hearing/Vision screen Eye Exam done at [facility] in November 2017.  Will request copy to place in chart  Dietary issues and exercise activities discussed: Current Exercise Habits: Home exercise routine, Type of exercise: walking, Frequency (Times/Week): 7, Intensity: Mild  Goals    . <enter goal here>          Stay Healthy Stay Cancer Free    . Have 3 meals a day          Eat More at home Increase fruits and vegetables  Increase lean proteins Goal Weight 170 lbs         Depression Screen PHQ 2/9 Scores 08/28/2016 06/03/2016 05/27/2016 02/26/2016 01/24/2016 11/21/2015 11/15/2015  PHQ - 2 Score 0 0 0 0 0 0 0    Fall Risk Fall Risk  08/28/2016 06/03/2016 05/27/2016 03/01/2016 02/26/2016  Falls in the past year? No No No No No    Cognitive Function: MMSE - Mini Mental State Exam 08/28/2016  Orientation to time 5  Orientation to Place 5  Registration 3  Attention/  Calculation 5  Recall 3  Language- name 2 objects 2  Language- repeat 1  Language- follow 3 step command 3  Language- read & follow direction 1  Write a sentence 1  Copy design 1  Total score 30  No Memory loss at this time      Screening Tests Health Maintenance  Topic Date Due  . DEXA SCAN  09/28/2016 (Originally 07/07/2015)  . HIV Screening  11/20/2016 (Originally 10/31/1970)  . TETANUS/TDAP  12/04/2016 (Originally 10/31/1972)  . INFLUENZA VACCINE  10/30/2016  . COLON CANCER SCREENING ANNUAL FOBT  08/09/2017  . MAMMOGRAM  11/30/2017  . PAP SMEAR  07/26/2018  . COLONOSCOPY  08/09/2021  . Hepatitis C Screening  Completed  Dexa Scan ordered- To Be scheduled Decline tetanus at this time     Plan:   Encouraged Mrs. Zoss to keep follow up appointment with [name] Done, FNP in September.  Encouraged to eat three meals daily that includes lean proteins, fruits and vegetables. Encourage to continue exercising at least 30 minutes, 3 times weekly.  Encourage patient to look over Advance Directive packet and bring notarized copy to this office to be scanned into chart.  Informed of importance of tetanus vaccine.  Dexa scan ordered and to be scheduled.  All other vaccines and health maintenance up to date at this time.  Copy of Eye exam from [facility] requested to be scanned into chart.  Patient aware to contact this office with any questions or concerns.  I have personally reviewed and noted the following in the patient's chart:   . Medical and social history . Use of alcohol,  tobacco or illicit drugs  . Current medications and supplements . Functional ability and status . Nutritional status . Physical activity . Advanced directives . List of other physicians . Hospitalizations, surgeries, and ER visits in previous 12 months . Vitals . Screenings to include cognitive, depression, and falls . Referrals and appointments  In addition, I have reviewed and discussed with patient certain preventive protocols, quality metrics, and best practice recommendations. A written personalized care plan for preventive services as well as general preventive health recommendations were provided to patient.     Wardell Heath, LPN   7/93/9030    I have reviewed and agree with the above AWV documentation.   Assunta Found, MD [facility] 08/28/2016, 5:00 PM

## 2016-08-28 NOTE — Patient Instructions (Addendum)
Keep Follow up appointment with [facility] on 12/06/2016 Think about tetanus vaccine to be done at follow up appt Bone Density scan to be scheduled Increase fruits, vegetables and lean proteins to diet Continue to exercise at least 30 mins three times weekly Fill out Advance Directive Packet    Julia Poole , Thank you for taking time to come for your Medicare Wellness Visit. I appreciate your ongoing commitment to your health goals. Please review the following plan we discussed and let me know if I can assist you in the future.   These are the goals we discussed: Goals    . <enter goal here>          Stay Healthy Stay Cancer Free    . Have 3 meals a day          Eat More at home Increase fruits and vegetables  Increase lean proteins Goal Weight 170 lbs        This is a list of the screening recommended for you and due dates:  Health Maintenance  Topic Date Due  . DEXA scan (bone density measurement)  07/07/2015  . HIV Screening  11/20/2016*  . Tetanus Vaccine  12/04/2016*  . Flu Shot  10/30/2016  . Stool Blood Test  08/09/2017  . Mammogram  11/30/2017  . Pap Smear  07/26/2018  . Colon Cancer Screening  08/09/2021  .  Hepatitis C: One time screening is recommended by Center for Disease Control  (CDC) for  adults born from [birth year] through 1967.   Completed  *Topic was postponed. The date shown is not the original due date.   Advance Directive Advance directives are legal documents that let you make choices ahead of time about your health care and medical treatment in case you become unable to communicate for yourself. Advance directives are a way for you to communicate your wishes to family, friends, and health care providers. This can help convey your decisions about end-of-life care if you become unable to communicate. Discussing and writing advance directives should happen over time rather than all at once. Advance directives can be changed depending on your situation and what you  want, even after you have signed the advance directives. If you do not have an advance directive, some states assign family decision makers to act on your behalf based on how closely you are related to them. Each state has its own laws regarding advance directives. You may want to check with your health care provider, attorney, or state representative about the laws in your state. There are different types of advance directives, such as:  Medical power of attorney.  Living will.  Do not resuscitate (DNR) or do not attempt resuscitation (DNAR) order. Health care proxy and medical power of attorney A health care proxy, also called a health care agent, is a person who is appointed to make medical decisions for you in cases in which you are unable to make the decisions yourself. Generally, people choose someone they know well and trust to represent their preferences. Make sure to ask this person for an agreement to act as your proxy. A proxy may have to exercise judgment in the event of a medical decision for which your wishes are not known. A medical power of attorney is a legal document that names your health care proxy. Depending on the laws in your state, after the document is written, it may also need to be:  Signed.  Notarized.  Dated.  Copied.  Witnessed.  Incorporated into your medical record. You may also want to appoint someone to manage your financial affairs in a situation in which you are unable to do so. This is called a durable power of attorney for finances. It is a separate legal document from the durable power of attorney for health care. You may choose the same person or someone different from your health care proxy to act as your agent in financial matters. If you do not appoint a proxy, or if there is a concern that the proxy is not acting in your best interests, a court-appointed guardian may be designated to act on your behalf. Living will A living will is a set of  instructions documenting your wishes about medical care when you cannot express them yourself. Health care providers should keep a copy of your living will in your medical record. You may want to give a copy to family members or friends. To alert caregivers in case of an emergency, you can place a card in your wallet to let them know that you have a living will and where they can find it. A living will is used if you become:  Terminally ill.  Incapacitated.  Unable to communicate or make decisions. Items to consider in your living will include:  The use or non-use of life-sustaining equipment, such as dialysis machines and breathing machines (ventilators).  A DNR or DNAR order, which is the instruction not to use cardiopulmonary resuscitation (CPR) if breathing or heartbeat stops.  The use or non-use of tube feeding.  Withholding of food and fluids.  Comfort (palliative) care when the goal becomes comfort rather than a cure.  Organ and tissue donation. A living will does not give instructions for distributing your money and property if you should pass away. It is recommended that you seek the advice of a lawyer when writing a will. Decisions about taxes, beneficiaries, and asset distribution will be legally binding. This process can relieve your family and friends of any concerns surrounding disputes or questions that may come up about the distribution of your assets. DNR or DNAR A DNR or DNAR order is a request not to have CPR in the event that your heart stops beating or you stop breathing. If a DNR or DNAR order has not been made and shared, a health care provider will try to help any patient whose heart has stopped or who has stopped breathing. If you plan to have surgery, talk with your health care provider about how your DNR or DNAR order will be followed if problems occur. Summary  Advance directives are the legal documents that allow you to make choices ahead of time about your  health care and medical treatment in case you become unable to communicate for yourself.  The process of discussing and writing advance directives should happen over time. You can change the advance directives, even after you have signed them.  Advance directives include DNR or DNAR orders, living wills, and designating an agent as your medical power of attorney. This information is not intended to replace advice given to you by your health care provider. Make sure you discuss any questions you have with your health care provider. Document Released: 06/25/2007 Document Revised: 02/05/2016 Document Reviewed: 02/05/2016 Elsevier Interactive Patient Education  2017 [employer].

## 2016-08-29 ENCOUNTER — Other Ambulatory Visit: Payer: Self-pay | Admitting: Nurse Practitioner

## 2016-08-29 DIAGNOSIS — E782 Mixed hyperlipidemia: Secondary | ICD-10-CM

## 2016-08-30 ENCOUNTER — Other Ambulatory Visit (INDEPENDENT_AMBULATORY_CARE_PROVIDER_SITE_OTHER): Payer: Self-pay | Admitting: Specialist

## 2016-08-30 NOTE — Telephone Encounter (Signed)
Daypro refill request 

## 2016-09-10 NOTE — Progress Notes (Signed)
Patient had colonoscopy 08/09/2016

## 2016-09-17 ENCOUNTER — Ambulatory Visit (INDEPENDENT_AMBULATORY_CARE_PROVIDER_SITE_OTHER): Payer: Medicare Other

## 2016-09-17 DIAGNOSIS — Z780 Asymptomatic menopausal state: Secondary | ICD-10-CM | POA: Diagnosis not present

## 2016-10-15 ENCOUNTER — Other Ambulatory Visit: Payer: Self-pay | Admitting: Nurse Practitioner

## 2016-10-16 ENCOUNTER — Telehealth: Payer: Self-pay | Admitting: Nurse Practitioner

## 2016-10-17 ENCOUNTER — Other Ambulatory Visit: Payer: Self-pay | Admitting: Nurse Practitioner

## 2016-10-17 MED ORDER — ACETAMINOPHEN-CODEINE #3 300-30 MG PO TABS: ORAL_TABLET | ORAL | 0 refills | Status: DC

## 2016-10-17 NOTE — Progress Notes (Signed)
Patient came in with husband for his appointment on 10/17/16 and stated while here that she needed her pain meds refilled- she has appointment scheduled for ear September. Was told usually needs appointment to get pain meds- was told would give rx today but in fiuture will need to make appointment.

## 2016-10-18 NOTE — Telephone Encounter (Signed)
Was done yesterday when husband came in to be seen

## 2016-11-12 ENCOUNTER — Ambulatory Visit
Admission: RE | Admit: 2016-11-12 | Discharge: 2016-11-12 | Disposition: A | Discharge status: Home / Self Care | Payer: Medicare Other | Source: Ambulatory Visit | Attending: Neurological Surgery | Admitting: Neurological Surgery

## 2016-11-12 DIAGNOSIS — D329 Benign neoplasm of meninges, unspecified: Secondary | ICD-10-CM | POA: Diagnosis not present

## 2016-11-12 MED ORDER — GADOBENATE DIMEGLUMINE 529 MG/ML IV SOLN
18.0000 mL | Freq: Once | INTRAVENOUS | Status: AC | PRN
  Administered 2016-11-12: 18 mL via INTRAVENOUS

## 2016-11-14 DIAGNOSIS — D329 Benign neoplasm of meninges, unspecified: Secondary | ICD-10-CM | POA: Diagnosis not present

## 2016-11-20 ENCOUNTER — Other Ambulatory Visit: Payer: Self-pay | Admitting: Neurological Surgery

## 2016-11-20 DIAGNOSIS — D329 Benign neoplasm of meninges, unspecified: Secondary | ICD-10-CM

## 2016-11-26 DIAGNOSIS — M4716 Other spondylosis with myelopathy, lumbar region: Secondary | ICD-10-CM | POA: Diagnosis not present

## 2016-12-03 DIAGNOSIS — Z853 Personal history of malignant neoplasm of breast: Secondary | ICD-10-CM | POA: Diagnosis not present

## 2016-12-03 DIAGNOSIS — R928 Other abnormal and inconclusive findings on diagnostic imaging of breast: Secondary | ICD-10-CM | POA: Diagnosis not present

## 2016-12-06 ENCOUNTER — Encounter: Payer: Self-pay | Admitting: Nurse Practitioner

## 2016-12-06 ENCOUNTER — Ambulatory Visit (INDEPENDENT_AMBULATORY_CARE_PROVIDER_SITE_OTHER): Payer: Medicare Other | Admitting: Nurse Practitioner

## 2016-12-06 VITALS — BP 122/73 | HR 69 | Temp 97.2°F | Ht 64.0 in | Wt 192.0 lb

## 2016-12-06 DIAGNOSIS — E034 Atrophy of thyroid (acquired): Secondary | ICD-10-CM | POA: Diagnosis not present

## 2016-12-06 DIAGNOSIS — Z6831 Body mass index (BMI) 31.0-31.9, adult: Secondary | ICD-10-CM

## 2016-12-06 DIAGNOSIS — E782 Mixed hyperlipidemia: Secondary | ICD-10-CM

## 2016-12-06 DIAGNOSIS — D420 Neoplasm of uncertain behavior of cerebral meninges: Secondary | ICD-10-CM | POA: Diagnosis not present

## 2016-12-06 DIAGNOSIS — M549 Dorsalgia, unspecified: Secondary | ICD-10-CM | POA: Diagnosis not present

## 2016-12-06 DIAGNOSIS — G8929 Other chronic pain: Secondary | ICD-10-CM

## 2016-12-06 DIAGNOSIS — C50911 Malignant neoplasm of unspecified site of right female breast: Secondary | ICD-10-CM | POA: Diagnosis not present

## 2016-12-06 MED ORDER — ACETAMINOPHEN-CODEINE #3 300-30 MG PO TABS: ORAL_TABLET | ORAL | 0 refills | Status: DC

## 2016-12-06 MED ORDER — LEVOTHYROXINE SODIUM 50 MCG PO TABS: 50.0000 ug | ORAL_TABLET | Freq: Every day | ORAL | 1 refills | Status: DC

## 2016-12-06 MED ORDER — ROSUVASTATIN CALCIUM 10 MG PO TABS: 10.0000 mg | ORAL_TABLET | Freq: Every day | ORAL | 1 refills | Status: DC

## 2016-12-06 NOTE — Progress Notes (Signed)
Subjective:    Patient ID: Julia Poole, female    DOB: [birth year], 61 y.o.   MRN: [id number]  HPI  Julia Poole is here today for follow up of chronic medical problem.  Outpatient Encounter Prescriptions as of 12/06/2016  Medication Sig  . acetaminophen-codeine (TYLENOL #3) 300-30 MG tablet TAKE ONE TABLET BY MOUTH EVERY FOUR HOURS AS NEEDED FOR PAIN  . Ascorbic Acid (VITAMIN C) 500 MG tablet Take 500 mg by mouth daily.   Marland Kitchen aspirin 81 MG tablet Take 81 mg by mouth daily.    . Calcium-Vitamin D (CALTRATE 600 PLUS-VIT D PO) Take 600 mg by mouth 2 (two) times daily.  . Cholecalciferol (VITAMIN D) 2000 UNITS tablet Take 2,000 Units by mouth 2 (two) times daily.  . COCONUT OIL PO Take by mouth.  . cyanocobalamin 100 MCG tablet Take 100 mcg by mouth daily.  Marland Kitchen FLAXSEED, LINSEED, PO Take by mouth.  Marland Kitchen GARLIC PO Take 5,621 mg by mouth daily.  . Glucosamine-Chondroitin (OSTEO BI-FLEX REGULAR STRENGTH PO) Take by mouth.  . levothyroxine (SYNTHROID, LEVOTHROID) 50 MCG tablet Take 1 Tablet by mouth once daily  . Multiple Vitamins-Minerals (CENTRUM SILVER PO) Take by mouth.    Marland Kitchen oxaprozin (DAYPRO) 600 MG tablet Take 1 Tablet by mouth 2 times a day as needed  . rosuvastatin (CRESTOR) 10 MG tablet Take 1 Tablet by mouth once daily  . TURMERIC PO Take by mouth.  . vitamin E (VITAMIN E) 400 UNIT capsule Take 400 Units by mouth daily.     1. Hypothyroidism due to acquired atrophy of thyroid  No problems that she is aware of  2. Atypical meningioma of brain ([place])  Has finished all of her treatments- hair has started growing back  3. Mixed hyperlipidemia  Tries to watch diet  4. Chronic back pain greater than 3 months duration  Rates pain right now 2-3/10. This is chronic pain for her. SHe is on tylenol #3 WHICH WORKS WELL TO KEEP PAIN UNDER CONTROL.  5. BMI 31.0-31.9,adult  No recent weight changes  6. Malignant neoplasm of right female breast, unspecified estrogen receptor status, unspecified  site of breast ([place])  Was many years ago and has done well. No reoccurence    New complaints: *none today  Social history: LIVES WITH HUSBAND AND SON    Review of Systems  Constitutional: Negative for activity change and appetite change.  HENT: Negative.   Eyes: Negative for pain.  Respiratory: Negative for shortness of breath.   Cardiovascular: Negative for chest pain, palpitations and leg swelling.  Gastrointestinal: Negative for abdominal pain.  Endocrine: Negative for polydipsia.  Genitourinary: Negative.   Skin: Negative for rash.  Neurological: Negative for dizziness, weakness and headaches.  Hematological: Does not bruise/bleed easily.  Psychiatric/Behavioral: Negative.   All other systems reviewed and are negative.      Objective:   Physical Exam  Constitutional: She is oriented to person, place, and time. She appears well-developed and well-nourished.  HENT:  Nose: Nose normal.  Mouth/Throat: Oropharynx is clear and moist.  Eyes: EOM are normal.  Neck: Trachea normal, normal range of motion and full passive range of motion without pain. Neck supple. No JVD present. Carotid bruit is not present. No thyromegaly present.  Cardiovascular: Normal rate, regular rhythm, normal heart sounds and intact distal pulses.  Exam reveals no gallop and no friction rub.   No murmur heard. Pulmonary/Chest: Effort normal and breath sounds normal.  Abdominal: Soft. Bowel sounds are normal.  She exhibits no distension and no mass. There is no tenderness.  Musculoskeletal: Normal range of motion.  Lymphadenopathy:    She has no cervical adenopathy.  Neurological: She is alert and oriented to person, place, and time. She has normal reflexes.  Skin: Skin is warm and dry.  Psychiatric: She has a normal mood and affect. Her behavior is normal. Judgment and thought content normal.    BP 122/73   Pulse 69   Temp (!) 97.2 F (36.2 C) (Oral)   Ht _0  (1.626 m)   Wt 192 lb (87.1 kg)    BMI 32.96 kg/m        Assessment & Plan:  1. Hypothyroidism due to acquired atrophy of thyroid - levothyroxine (SYNTHROID, LEVOTHROID) 50 MCG tablet; Take 1 tablet (50 mcg total) by mouth daily.  Dispense: 90 tablet; Refill: 1 - Thyroid Panel With TSH  2. Atypical meningioma of brain [facility]) Keep follow up with neurologist  3. Mixed hyperlipidemia Low fat diet encouraged - rosuvastatin (CRESTOR) 10 MG tablet; Take 1 tablet (10 mg total) by mouth daily.  Dispense: 90 tablet; Refill: 1 - CMP14+EGFR - Lipid panel  4. Chronic back pain greater than 3 months duration - acetaminophen-codeine (TYLENOL #3) 300-30 MG tablet; TAKE ONE TABLET BY MOUTH EVERY FOUR HOURS AS NEEDED FOR PAIN  Dispense: 120 tablet; Refill: 0  5. BMI 31.0-31.9,adult Discussed diet and exercise for person with BMI >25 Will recheck weight in 3-6 months  6. Malignant neoplasm of right female breast, unspecified estrogen receptor status, unspecified site of breast ([place]) Keep follow up of mammograms yearly Continue self breast exams    Labs pending Health maintenance reviewed Diet and exercise encouraged Continue all meds Follow up  In 6 months   [place], FNP

## 2016-12-06 NOTE — Patient Instructions (Signed)
Fat and Cholesterol Restricted Diet Getting too much fat and cholesterol in your diet may cause health problems. Following this diet helps keep your fat and cholesterol at normal levels. This can keep you from getting sick. What types of fat should I choose?  Choose monosaturated and polyunsaturated fats. These are found in foods such as olive oil, canola oil, flaxseeds, walnuts, almonds, and seeds.  Eat more omega-3 fats. Good choices include salmon, mackerel, sardines, tuna, flaxseed oil, and ground flaxseeds.  Limit saturated fats. These are in animal products such as meats, butter, and cream. They can also be in plant products such as palm oil, palm kernel oil, and coconut oil.  Avoid foods with partially hydrogenated oils in them. These contain trans fats. Examples of foods that have trans fats are stick margarine, some tub margarines, cookies, crackers, and other baked goods. What general guidelines do I need to follow?  Check food labels. Look for the words "trans fat" and "saturated fat."  When preparing a meal: ? Fill half of your plate with vegetables and green salads. ? Fill one fourth of your plate with whole grains. Look for the word "whole" as the first word in the ingredient list. ? Fill one fourth of your plate with lean protein foods.  Eat more foods that have fiber, like apples, carrots, beans, peas, and barley.  Eat more home-cooked foods. Eat less at restaurants and buffets.  Limit or avoid alcohol.  Limit foods high in starch and sugar.  Limit fried foods.  Cook foods without frying them. Baking, boiling, grilling, and broiling are all great options.  Lose weight if you are overweight. Losing even a small amount of weight can help your overall health. It can also help prevent diseases such as diabetes and heart disease. What foods can I eat? Grains Whole grains, such as whole wheat or whole grain breads, crackers, cereals, and pasta. Unsweetened oatmeal,  bulgur, barley, quinoa, or brown rice. Corn or whole wheat flour tortillas. Vegetables Fresh or frozen vegetables (raw, steamed, roasted, or grilled). Green salads. Fruits All fresh, canned (in natural juice), or frozen fruits. Meat and Other Protein Products Ground beef (85% or leaner), grass-fed beef, or beef trimmed of fat. Skinless chicken or turkey. Ground chicken or turkey. Pork trimmed of fat. All fish and seafood. Eggs. Dried beans, peas, or lentils. Unsalted nuts or seeds. Unsalted canned or dry beans. Dairy Low-fat dairy products, such as skim or 1% milk, 2% or reduced-fat cheeses, low-fat ricotta or cottage cheese, or plain low-fat yogurt. Fats and Oils Tub margarines without trans fats. Light or reduced-fat mayonnaise and salad dressings. Avocado. Olive, canola, sesame, or safflower oils. Natural peanut or almond butter (choose ones without added sugar and oil). The items listed above may not be a complete list of recommended foods or beverages. Contact your dietitian for more options. What foods are not recommended? Grains White bread. White pasta. White rice. Cornbread. Bagels, pastries, and croissants. Crackers that contain trans fat. Vegetables White potatoes. Corn. Creamed or fried vegetables. Vegetables in a cheese sauce. Fruits Dried fruits. Canned fruit in light or heavy syrup. Fruit juice. Meat and Other Protein Products Fatty cuts of meat. Ribs, chicken wings, bacon, sausage, bologna, salami, chitterlings, fatback, hot dogs, bratwurst, and packaged luncheon meats. Liver and organ meats. Dairy Whole or 2% milk, cream, half-and-half, and cream cheese. Whole milk cheeses. Whole-fat or sweetened yogurt. Full-fat cheeses. Nondairy creamers and whipped toppings. Processed cheese, cheese spreads, or cheese curds. Sweets and Desserts Corn   syrup, sugars, honey, and molasses. Candy. Jam and jelly. Syrup. Sweetened cereals. Cookies, pies, cakes, donuts, muffins, and ice  cream. Fats and Oils Butter, stick margarine, lard, shortening, ghee, or bacon fat. Coconut, palm kernel, or palm oils. Beverages Alcohol. Sweetened drinks (such as sodas, lemonade, and fruit drinks or punches). The items listed above may not be a complete list of foods and beverages to avoid. Contact your dietitian for more information. This information is not intended to replace advice given to you by your health care provider. Make sure you discuss any questions you have with your health care provider. Document Released: 09/17/2011 Document Revised: 11/23/2015 Document Reviewed: 06/17/2013 Elsevier Interactive Patient Education  2018 [employer]  

## 2016-12-07 LAB — CMP14+EGFR
A/G RATIO: 1.9 (ref 1.2–2.2)
ALK PHOS: 90 IU/L (ref 39–117)
ALT: 26 IU/L (ref 0–32)
AST: 28 IU/L (ref 0–40)
Albumin: 4.5 g/dL (ref 3.6–4.8)
BUN/Creatinine Ratio: 19 (ref 12–28)
BUN: 12 mg/dL (ref 8–27)
Bilirubin Total: 0.9 mg/dL (ref 0.0–1.2)
CO2: 23 mmol/L (ref 20–29)
CREATININE: 0.63 mg/dL (ref 0.57–1.00)
Calcium: 9.6 mg/dL (ref 8.7–10.3)
Chloride: 103 mmol/L (ref 96–106)
GFR calc Af Amer: 110 mL/min/{1.73_m2} (ref >59)
GFR calc non Af Amer: 96 mL/min/{1.73_m2} (ref >59)
GLOBULIN, TOTAL: 2.4 g/dL (ref 1.5–4.5)
Glucose: 94 mg/dL (ref 65–99)
POTASSIUM: 4.2 mmol/L (ref 3.5–5.2)
SODIUM: 144 mmol/L (ref 134–144)
Total Protein: 6.9 g/dL (ref 6.0–8.5)

## 2016-12-07 LAB — LIPID PANEL
Chol/HDL Ratio: 2.9 ratio (ref 0.0–4.4)
Cholesterol, Total: 144 mg/dL (ref 100–199)
HDL: 50 mg/dL (ref >39)
LDL Calculated: 74 mg/dL (ref 0–99)
TRIGLYCERIDES: 98 mg/dL (ref 0–149)
VLDL Cholesterol Cal: 20 mg/dL (ref 5–40)

## 2016-12-07 LAB — THYROID PANEL WITH TSH
FREE THYROXINE INDEX: 1.8 (ref 1.2–4.9)
T3 UPTAKE RATIO: 25 % (ref 24–39)
T4, Total: 7.3 ug/dL (ref 4.5–12.0)
TSH: 2.27 u[IU]/mL (ref 0.450–4.500)

## 2016-12-30 ENCOUNTER — Encounter: Payer: Self-pay | Admitting: Family Medicine

## 2016-12-30 ENCOUNTER — Ambulatory Visit (INDEPENDENT_AMBULATORY_CARE_PROVIDER_SITE_OTHER): Payer: Medicare Other | Admitting: Family Medicine

## 2016-12-30 VITALS — BP 124/70 | HR 74 | Temp 98.4°F | Resp 22 | Ht 64.0 in | Wt 192.2 lb

## 2016-12-30 DIAGNOSIS — R05 Cough: Secondary | ICD-10-CM | POA: Diagnosis not present

## 2016-12-30 DIAGNOSIS — R059 Cough, unspecified: Secondary | ICD-10-CM

## 2016-12-30 MED ORDER — AZITHROMYCIN 250 MG PO TABS: ORAL_TABLET | ORAL | 0 refills | Status: DC

## 2016-12-30 NOTE — Patient Instructions (Signed)
Great to meet you!  Try the z pack and flonase   Acute Bronchitis, Adult Acute bronchitis is when air tubes (bronchi) in the lungs suddenly get swollen. The condition can make it hard to breathe. It can also cause these symptoms:  A cough.  Coughing up clear, yellow, or green mucus.  Wheezing.  Chest congestion.  Shortness of breath.  A fever.  Body aches.  Chills.  A sore throat.  Follow these instructions at home: Medicines  Take over-the-counter and prescription medicines only as told by your doctor.  If you were prescribed an antibiotic medicine, take it as told by your doctor. Do not stop taking the antibiotic even if you start to feel better. General instructions  Rest.  Drink enough fluids to keep your pee (urine) clear or pale yellow.  Avoid smoking and secondhand smoke. If you smoke and you need help quitting, ask your doctor. Quitting will help your lungs heal faster.  Use an inhaler, cool mist vaporizer, or humidifier as told by your doctor.  Keep all follow-up visits as told by your doctor. This is important. How is this prevented? To lower your risk of getting this condition again:  Wash your hands often with soap and water. If you cannot use soap and water, use hand sanitizer.  Avoid contact with people who have cold symptoms.  Try not to touch your hands to your mouth, nose, or eyes.  Make sure to get the flu shot every year.  Contact a doctor if:  Your symptoms do not get better in 2 weeks. Get help right away if:  You cough up blood.  You have chest pain.  You have very bad shortness of breath.  You become dehydrated.  You faint (pass out) or keep feeling like you are going to pass out.  You keep throwing up (vomiting).  You have a very bad headache.  Your fever or chills gets worse. This information is not intended to replace advice given to you by your health care provider. Make sure you discuss any questions you have with  your health care provider. Document Released: 09/04/2007 Document Revised: 10/25/2015 Document Reviewed: 09/06/2015 Elsevier Interactive Patient Education  2017 [employer].

## 2016-12-30 NOTE — Progress Notes (Signed)
   HPI  Patient presents today with cough and congestion.  Patient has 2 days of symptoms including mild shortness of breath and chest tightness with deep inspiration. She has cough, hoarseness. She also has some fatigue in nasal congestion.  She's tolerating food and fluids acute usual. She denies fever She states that she is going on a trip this weekend to a wedding and needs to be this regard refer, she would like to treat aggressively  PMH: Smoking status noted ROS: Per HPI  Objective: BP 124/70   Pulse 74   Temp 98.4 F (36.9 C) (Oral)   Resp (!) 22   Ht 5\' 4"  (1.626 m)   Wt 192 lb 3.2 oz (87.2 kg)   SpO2 100%   BMI 32.99 kg/m  Gen: NAD, alert, cooperative with exam HEENT: NCAT, oropharynx moist and clear, TMs normal bilaterally, nares clear, no tenderness to palpation of bilateral maxillary frontal sinuses CV: RRR, good S1/S2, no murmur Resp: CTABL, no wheezes, non-labored Ext: No edema, warm Neuro: Alert and oriented, No gross deficits  Assessment and plan:  # Cough Discussed that this is possibly viral cough, however given her upcoming event and her concerns I have gone ahead and cover with azithromycin. Patient does have some worrisome signs including mild developing shortness of breath and chest tightness with deep inspiration. Follow-up as needed  Meds ordered this encounter  Medications  . DISCONTD: azithromycin (ZITHROMAX) 250 MG tablet    Sig: Take 2 tablets on day 1 and 1 tablet daily after that    Dispense:  6 tablet    Refill:  0  . azithromycin (ZITHROMAX) 250 MG tablet    Sig: Take 2 tablets on day 1 and 1 tablet daily after that    Dispense:  6 tablet    Refill:  0    Laroy Apple, MD Tristan Schroeder [facility] 12/30/2016, 7:18 PM

## 2017-01-08 ENCOUNTER — Other Ambulatory Visit: Payer: Self-pay | Admitting: Radiation Therapy

## 2017-01-08 DIAGNOSIS — D420 Neoplasm of uncertain behavior of cerebral meninges: Secondary | ICD-10-CM

## 2017-03-04 DIAGNOSIS — M4807 Spinal stenosis, lumbosacral region: Secondary | ICD-10-CM | POA: Diagnosis not present

## 2017-03-04 DIAGNOSIS — M4306 Spondylolysis, lumbar region: Secondary | ICD-10-CM | POA: Diagnosis not present

## 2017-03-04 DIAGNOSIS — M48061 Spinal stenosis, lumbar region without neurogenic claudication: Secondary | ICD-10-CM | POA: Diagnosis not present

## 2017-03-04 DIAGNOSIS — Z6832 Body mass index (BMI) 32.0-32.9, adult: Secondary | ICD-10-CM | POA: Diagnosis not present

## 2017-03-04 DIAGNOSIS — M4316 Spondylolisthesis, lumbar region: Secondary | ICD-10-CM | POA: Diagnosis not present

## 2017-03-04 DIAGNOSIS — M47816 Spondylosis without myelopathy or radiculopathy, lumbar region: Secondary | ICD-10-CM | POA: Diagnosis not present

## 2017-03-10 ENCOUNTER — Other Ambulatory Visit: Payer: Medicare Other

## 2017-03-17 ENCOUNTER — Other Ambulatory Visit: Payer: Self-pay | Admitting: Nurse Practitioner

## 2017-03-17 DIAGNOSIS — M549 Dorsalgia, unspecified: Principal | ICD-10-CM

## 2017-03-17 DIAGNOSIS — G8929 Other chronic pain: Secondary | ICD-10-CM

## 2017-03-18 ENCOUNTER — Telehealth: Payer: Self-pay | Admitting: Nurse Practitioner

## 2017-03-18 NOTE — Telephone Encounter (Signed)
I did not deny it but has to be seen to get refill because it is controlled

## 2017-03-19 NOTE — Telephone Encounter (Signed)
Pt given appt 12/21 at 8:30 with MMM for pain med refill.

## 2017-03-20 ENCOUNTER — Ambulatory Visit
Admission: RE | Admit: 2017-03-20 | Discharge: 2017-03-20 | Disposition: A | Discharge status: Home / Self Care | Payer: Medicare Other | Source: Ambulatory Visit | Attending: Neurological Surgery | Admitting: Neurological Surgery

## 2017-03-20 DIAGNOSIS — D329 Benign neoplasm of meninges, unspecified: Secondary | ICD-10-CM

## 2017-03-20 DIAGNOSIS — D320 Benign neoplasm of cerebral meninges: Secondary | ICD-10-CM | POA: Diagnosis not present

## 2017-03-20 MED ORDER — GADOBENATE DIMEGLUMINE 529 MG/ML IV SOLN
18.0000 mL | Freq: Once | INTRAVENOUS | Status: AC | PRN
  Administered 2017-03-20: 18 mL via INTRAVENOUS

## 2017-03-21 ENCOUNTER — Ambulatory Visit (INDEPENDENT_AMBULATORY_CARE_PROVIDER_SITE_OTHER): Payer: Medicare Other | Admitting: Nurse Practitioner

## 2017-03-21 ENCOUNTER — Encounter: Payer: Self-pay | Admitting: Nurse Practitioner

## 2017-03-21 VITALS — BP 118/71 | HR 76 | Temp 97.4°F | Ht 64.0 in | Wt 195.0 lb

## 2017-03-21 DIAGNOSIS — R52 Pain, unspecified: Secondary | ICD-10-CM

## 2017-03-21 DIAGNOSIS — M549 Dorsalgia, unspecified: Secondary | ICD-10-CM

## 2017-03-21 DIAGNOSIS — D329 Benign neoplasm of meninges, unspecified: Secondary | ICD-10-CM | POA: Diagnosis not present

## 2017-03-21 DIAGNOSIS — G8929 Other chronic pain: Secondary | ICD-10-CM

## 2017-03-21 MED ORDER — ACETAMINOPHEN-CODEINE #3 300-30 MG PO TABS: ORAL_TABLET | ORAL | 0 refills | Status: DC

## 2017-03-21 NOTE — Patient Instructions (Signed)

## 2017-03-21 NOTE — Progress Notes (Signed)
   Subjective:    Patient ID: Julia Poole, female    DOB: 1955-06-12, 61 y.o.   MRN: [id number]  HPI Pain assessment: Cause of pain- lumbar spondylosis Pain location- low back Pain on scale of 1-10- 0/10 when sitting , 7-8/10 Frequency- daily if active What increases pain-movemnet What makes pain Better- rest Effects on ADL - is still able to get done what she needs to  Any change in general medical condition-none  Current medications- tylenol #3 1-2x a day Effectiveness of current meds-brings pain down to 2/10 Adverse reactions form pain meds-none Morphine equivalent- 3  Pill count performed-No Urine drug screen- No Was the [facility] reviewed- yes  If yes were their any concerning findings? - no problems  Pain contract signed on: 03/21/17     Review of Systems  Constitutional: Negative for activity change and appetite change.  HENT: Negative.   Eyes: Negative for pain.  Respiratory: Negative for shortness of breath.   Cardiovascular: Negative for chest pain, palpitations and leg swelling.  Gastrointestinal: Negative for abdominal pain.  Endocrine: Negative for polydipsia.  Genitourinary: Negative.   Skin: Negative for rash.  Neurological: Negative for dizziness, weakness and headaches.  Hematological: Does not bruise/bleed easily.  Psychiatric/Behavioral: Negative.   All other systems reviewed and are negative.      Objective:   Physical Exam  Constitutional: She is oriented to person, place, and time. She appears well-developed and well-nourished. No distress.  Cardiovascular: Normal rate.  Pulmonary/Chest: Effort normal and breath sounds normal.  Musculoskeletal:  Rises slowly from sitting to standing FROM with pain on flexion and extension  Neurological: She is alert and oriented to person, place, and time.  Skin: Skin is warm.  Psychiatric: She has a normal mood and affect. Her behavior is normal. Thought content normal.   BP 118/71   Pulse 76   Temp (!)  97.4 F (36.3 C) (Oral)   Ht 5\' 4"  (1.626 m)   Wt 195 lb (88.5 kg)   BMI 33.47 kg/m         Assessment & Plan:   1. Pain management   2. Chronic back pain greater than 3 months duration    Meds ordered this encounter  Medications  . acetaminophen-codeine (TYLENOL #3) 300-30 MG tablet    Sig: TAKE ONE TABLET BY MOUTH EVERY FOUR HOURS AS NEEDED FOR PAIN    Dispense:  120 tablet    Refill:  0    Not to exceed 6 additional fills before 10/29/2015    Order Specific Question:   Supervising Provider    Answer:   [name] [[id number]]   Back exercises encouraged  [name] Done, FNP

## 2017-04-04 ENCOUNTER — Other Ambulatory Visit: Payer: Self-pay | Admitting: Neurological Surgery

## 2017-04-04 DIAGNOSIS — D329 Benign neoplasm of meninges, unspecified: Secondary | ICD-10-CM

## 2017-06-03 DIAGNOSIS — M47816 Spondylosis without myelopathy or radiculopathy, lumbar region: Secondary | ICD-10-CM | POA: Diagnosis not present

## 2017-06-03 DIAGNOSIS — M4316 Spondylolisthesis, lumbar region: Secondary | ICD-10-CM | POA: Diagnosis not present

## 2017-06-03 DIAGNOSIS — Z6832 Body mass index (BMI) 32.0-32.9, adult: Secondary | ICD-10-CM | POA: Diagnosis not present

## 2017-06-04 ENCOUNTER — Encounter: Payer: Self-pay | Admitting: Genetic Counselor

## 2017-06-04 DIAGNOSIS — H2513 Age-related nuclear cataract, bilateral: Secondary | ICD-10-CM | POA: Diagnosis not present

## 2017-06-05 ENCOUNTER — Ambulatory Visit (INDEPENDENT_AMBULATORY_CARE_PROVIDER_SITE_OTHER): Payer: Medicare Other | Admitting: Nurse Practitioner

## 2017-06-05 ENCOUNTER — Encounter: Payer: Self-pay | Admitting: Nurse Practitioner

## 2017-06-05 VITALS — BP 116/73 | HR 75 | Temp 97.1°F | Ht 64.0 in | Wt 199.0 lb

## 2017-06-05 DIAGNOSIS — E034 Atrophy of thyroid (acquired): Secondary | ICD-10-CM | POA: Diagnosis not present

## 2017-06-05 DIAGNOSIS — E782 Mixed hyperlipidemia: Secondary | ICD-10-CM

## 2017-06-05 DIAGNOSIS — D420 Neoplasm of uncertain behavior of cerebral meninges: Secondary | ICD-10-CM

## 2017-06-05 DIAGNOSIS — Z6831 Body mass index (BMI) 31.0-31.9, adult: Secondary | ICD-10-CM | POA: Diagnosis not present

## 2017-06-05 DIAGNOSIS — G8929 Other chronic pain: Secondary | ICD-10-CM | POA: Diagnosis not present

## 2017-06-05 DIAGNOSIS — M549 Dorsalgia, unspecified: Secondary | ICD-10-CM | POA: Diagnosis not present

## 2017-06-05 MED ORDER — ROSUVASTATIN CALCIUM 10 MG PO TABS: 10.0000 mg | ORAL_TABLET | Freq: Every day | ORAL | 1 refills | Status: DC

## 2017-06-05 MED ORDER — ACETAMINOPHEN-CODEINE #3 300-30 MG PO TABS: ORAL_TABLET | ORAL | 0 refills | Status: DC

## 2017-06-05 MED ORDER — LEVOTHYROXINE SODIUM 50 MCG PO TABS: 50.0000 ug | ORAL_TABLET | Freq: Every day | ORAL | 1 refills | Status: DC

## 2017-06-05 NOTE — Patient Instructions (Signed)

## 2017-06-05 NOTE — Progress Notes (Signed)
Subjective:    Patient ID: Julia Poole, female    DOB: 07-15-1955, 62 y.o.   MRN: [id number]  HPI  Julia Poole is here today for follow up of chronic medical problem.  Outpatient Encounter Medications as of 06/05/2017  Medication Sig  . acetaminophen-codeine (TYLENOL #3) 300-30 MG tablet TAKE ONE TABLET BY MOUTH EVERY FOUR HOURS AS NEEDED FOR PAIN  . Ascorbic Acid (VITAMIN C) 500 MG tablet Take 500 mg by mouth daily.   Marland Kitchen aspirin 81 MG tablet Take 81 mg by mouth daily.    . Calcium-Vitamin D (CALTRATE 600 PLUS-VIT D PO) Take 600 mg by mouth 2 (two) times daily.  . Cholecalciferol (VITAMIN D) 2000 UNITS tablet Take 2,000 Units by mouth 2 (two) times daily.  . COCONUT OIL PO Take by mouth.  . cyanocobalamin 100 MCG tablet Take 100 mcg by mouth daily.  Marland Kitchen FLAXSEED, LINSEED, PO Take by mouth.  Marland Kitchen GARLIC PO Take 9,249 mg by mouth daily.  . Glucosamine-Chondroitin (OSTEO BI-FLEX REGULAR STRENGTH PO) Take by mouth.  . levothyroxine (SYNTHROID, LEVOTHROID) 50 MCG tablet Take 1 tablet (50 mcg total) by mouth daily.  . Multiple Vitamins-Minerals (CENTRUM SILVER PO) Take by mouth.    . rosuvastatin (CRESTOR) 10 MG tablet Take 1 tablet (10 mg total) by mouth daily.  . TURMERIC PO Take by mouth.  . vitamin E (VITAMIN E) 400 UNIT capsule Take 400 Units by mouth daily.     1. Mixed hyperlipidemia  Does not watch diet at all. Very little exercise  2. Hypothyroidism due to acquired atrophy of thyroid  Not having any problems that she is aware of.  3. Atypical meningioma of brain [facility])  Last visit with neurosurgeon was 06/03/17. No changes made to care.  4. BMI 31.0-31.9,adult  No recent weight changes  5. Chronic back pain greater than 3 months duration  Patient has had back pain for years. She takes tylenol #3 1-2 a day. Works well for pain    New complaints: None today  Social history: Lives with husband and son    Review of Systems  Constitutional: Negative for activity change and  appetite change.  HENT: Negative.   Eyes: Negative for pain.  Respiratory: Negative for shortness of breath.   Cardiovascular: Negative for chest pain, palpitations and leg swelling.  Gastrointestinal: Negative for abdominal pain.  Endocrine: Negative for polydipsia.  Genitourinary: Negative.   Skin: Negative for rash.  Neurological: Negative for dizziness, weakness and headaches.  Hematological: Does not bruise/bleed easily.  Psychiatric/Behavioral: Negative.   All other systems reviewed and are negative.      Objective:   Physical Exam  Constitutional: She is oriented to person, place, and time. She appears well-developed and well-nourished.  HENT:  Nose: Nose normal.  Mouth/Throat: Oropharynx is clear and moist.  Eyes: EOM are normal.  Neck: Trachea normal, normal range of motion and full passive range of motion without pain. Neck supple. No JVD present. Carotid bruit is not present. No thyromegaly present.  Cardiovascular: Normal rate, regular rhythm, normal heart sounds and intact distal pulses. Exam reveals no gallop and no friction rub.  No murmur heard. Pulmonary/Chest: Effort normal and breath sounds normal.  Abdominal: Soft. Bowel sounds are normal. She exhibits no distension and no mass. There is no tenderness.  Musculoskeletal: Normal range of motion.  Lymphadenopathy:    She has no cervical adenopathy.  Neurological: She is alert and oriented to person, place, and time. She has normal reflexes.  Skin: Skin is warm and dry.  Psychiatric: She has a normal mood and affect. Her behavior is normal. Judgment and thought content normal.   BP 116/73   Pulse 75   Temp (!) 97.1 F (36.2 C) (Oral)   Ht '5\' 4"'  (1.626 m)   Wt 199 lb (90.3 kg)   BMI 34.16 kg/m         Assessment & Plan:  1. Mixed hyperlipidemia Low fat diet - CMP14+EGFR - Lipid panel - rosuvastatin (CRESTOR) 10 MG tablet; Take 1 tablet (10 mg total) by mouth daily.  Dispense: 90 tablet; Refill:  1  2. Hypothyroidism due to acquired atrophy of thyroid - levothyroxine (SYNTHROID, LEVOTHROID) 50 MCG tablet; Take 1 tablet (50 mcg total) by mouth daily.  Dispense: 90 tablet; Refill: 1  3. Atypical meningioma of brain [facility]) keep follow up with neurosurgeon  4. BMI 31.0-31.9,adult Discussed diet and exercise for person with BMI >25 Will recheck weight in 3-6 months  5. Chronic back pain greater than 3 months duration - acetaminophen-codeine (TYLENOL #3) 300-30 MG tablet; TAKE ONE TABLET BY MOUTH EVERY FOUR HOURS AS NEEDED FOR PAIN  Dispense: 120 tablet; Refill: 0    Labs pending Health maintenance reviewed Diet and exercise encouraged Continue all meds Follow up  In 6 month   [place], FNP

## 2017-06-06 LAB — CMP14+EGFR
A/G RATIO: 2.1 (ref 1.2–2.2)
ALBUMIN: 4.5 g/dL (ref 3.6–4.8)
ALT: 18 IU/L (ref 0–32)
AST: 20 IU/L (ref 0–40)
Alkaline Phosphatase: 78 IU/L (ref 39–117)
BILIRUBIN TOTAL: 0.5 mg/dL (ref 0.0–1.2)
BUN / CREAT RATIO: 20 (ref 12–28)
BUN: 14 mg/dL (ref 8–27)
CHLORIDE: 102 mmol/L (ref 96–106)
CO2: 25 mmol/L (ref 20–29)
Calcium: 9.2 mg/dL (ref 8.7–10.3)
Creatinine, Ser: 0.71 mg/dL (ref 0.57–1.00)
GFR calc non Af Amer: 91 mL/min/{1.73_m2} (ref >59)
GFR, EST AFRICAN AMERICAN: 105 mL/min/{1.73_m2} (ref >59)
GLOBULIN, TOTAL: 2.1 g/dL (ref 1.5–4.5)
Glucose: 109 mg/dL — ABNORMAL HIGH (ref 65–99)
POTASSIUM: 4.8 mmol/L (ref 3.5–5.2)
Sodium: 139 mmol/L (ref 134–144)
TOTAL PROTEIN: 6.6 g/dL (ref 6.0–8.5)

## 2017-06-06 LAB — LIPID PANEL
CHOL/HDL RATIO: 2.6 ratio (ref 0.0–4.4)
Cholesterol, Total: 136 mg/dL (ref 100–199)
HDL: 52 mg/dL (ref >39)
LDL Calculated: 73 mg/dL (ref 0–99)
Triglycerides: 57 mg/dL (ref 0–149)
VLDL Cholesterol Cal: 11 mg/dL (ref 5–40)

## 2017-07-21 ENCOUNTER — Ambulatory Visit
Admission: RE | Admit: 2017-07-21 | Discharge: 2017-07-21 | Disposition: A | Discharge status: Home / Self Care | Payer: Medicare Other | Source: Ambulatory Visit | Attending: Neurological Surgery | Admitting: Neurological Surgery

## 2017-07-21 DIAGNOSIS — D320 Benign neoplasm of cerebral meninges: Secondary | ICD-10-CM | POA: Diagnosis not present

## 2017-07-21 DIAGNOSIS — D329 Benign neoplasm of meninges, unspecified: Secondary | ICD-10-CM

## 2017-07-21 MED ORDER — GADOBENATE DIMEGLUMINE 529 MG/ML IV SOLN
18.0000 mL | Freq: Once | INTRAVENOUS | Status: AC | PRN
  Administered 2017-07-21: 18 mL via INTRAVENOUS

## 2017-07-28 DIAGNOSIS — R030 Elevated blood-pressure reading, without diagnosis of hypertension: Secondary | ICD-10-CM | POA: Diagnosis not present

## 2017-07-28 DIAGNOSIS — Z6832 Body mass index (BMI) 32.0-32.9, adult: Secondary | ICD-10-CM | POA: Diagnosis not present

## 2017-07-28 DIAGNOSIS — D329 Benign neoplasm of meninges, unspecified: Secondary | ICD-10-CM | POA: Diagnosis not present

## 2017-07-31 DIAGNOSIS — Z124 Encounter for screening for malignant neoplasm of cervix: Secondary | ICD-10-CM | POA: Diagnosis not present

## 2017-08-27 ENCOUNTER — Other Ambulatory Visit: Payer: Self-pay | Admitting: Nurse Practitioner

## 2017-08-27 DIAGNOSIS — G8929 Other chronic pain: Secondary | ICD-10-CM

## 2017-08-27 DIAGNOSIS — M549 Dorsalgia, unspecified: Principal | ICD-10-CM

## 2017-09-03 ENCOUNTER — Ambulatory Visit: Payer: Medicare Other | Admitting: *Deleted

## 2017-09-09 DIAGNOSIS — M4316 Spondylolisthesis, lumbar region: Secondary | ICD-10-CM | POA: Diagnosis not present

## 2017-09-09 DIAGNOSIS — M4306 Spondylolysis, lumbar region: Secondary | ICD-10-CM | POA: Diagnosis not present

## 2017-09-09 DIAGNOSIS — Z6832 Body mass index (BMI) 32.0-32.9, adult: Secondary | ICD-10-CM | POA: Diagnosis not present

## 2017-09-25 ENCOUNTER — Ambulatory Visit (INDEPENDENT_AMBULATORY_CARE_PROVIDER_SITE_OTHER): Payer: Medicare Other | Admitting: *Deleted

## 2017-09-25 ENCOUNTER — Encounter: Payer: Self-pay | Admitting: *Deleted

## 2017-09-25 VITALS — BP 125/66 | HR 74 | Ht 64.25 in | Wt 196.0 lb

## 2017-09-25 DIAGNOSIS — Z0000 Encounter for general adult medical examination without abnormal findings: Secondary | ICD-10-CM | POA: Diagnosis not present

## 2017-09-25 NOTE — Patient Instructions (Signed)
  Ms. Mentink , Thank you for taking time to come for your Medicare Wellness Visit. I appreciate your ongoing commitment to your health goals. Please review the following plan we discussed and let me know if I can assist you in the future.   These are the goals we discussed: Goals    . Exercise 150 min/wk Moderate Activity       This is a list of the screening recommended for you and due dates:  Health Maintenance  Topic Date Due  . Flu Shot  11/21/2017*  . HIV Screening  12/06/2017*  . Tetanus Vaccine  09/26/2018*  . Mammogram  11/30/2017  . Pap Smear  07/26/2018  . DEXA scan (bone density measurement)  09/25/2018  . Colon Cancer Screening  08/09/2021  .  Hepatitis C: One time screening is recommended by Center for Disease Control  (CDC) for  adults born from [birth year] through 1967.   Completed  *Topic was postponed. The date shown is not the original due date.

## 2017-09-25 NOTE — Progress Notes (Addendum)
Subjective:   Julia Poole is a 62 y.o. female who presents for a Medicare Annual Wellness Visit. Julia Poole lives at home with her husband and adult son. She is retired/disabled and worked for the [employer].     Review of Systems    Patient reports that her overall health is unchanged compared to last year.  Cardiac Risk Factors include: advanced age (>76men, >66 women);hypertension;sedentary lifestyle;obesity (BMI >30kg/m2)    All other systems negative       Current Medications (verified) Outpatient Encounter Medications as of 09/25/2017  Medication Sig  . acetaminophen-codeine (TYLENOL #3) 300-30 MG tablet TAKE ONE TABLET BY MOUTH EVERY FOUR HOURS AS NEEDED FOR PAIN  . Ascorbic Acid (VITAMIN C) 500 MG tablet Take 500 mg by mouth daily.   Marland Kitchen aspirin 81 MG tablet Take 81 mg by mouth daily.    . Calcium-Vitamin D (CALTRATE 600 PLUS-VIT D PO) Take 600 mg by mouth 2 (two) times daily.  . Cholecalciferol (VITAMIN D) 2000 UNITS tablet Take 2,000 Units by mouth 2 (two) times daily.  . COCONUT OIL PO Take by mouth.  . cyanocobalamin 100 MCG tablet Take 100 mcg by mouth daily.  Marland Kitchen FLAXSEED, LINSEED, PO Take by mouth.  Marland Kitchen GARLIC PO Take 6,734 mg by mouth daily.  . Glucosamine-Chondroitin (OSTEO BI-FLEX REGULAR STRENGTH PO) Take by mouth.  . levothyroxine (SYNTHROID, LEVOTHROID) 50 MCG tablet Take 1 tablet (50 mcg total) by mouth daily.  . Multiple Vitamins-Minerals (CENTRUM SILVER PO) Take by mouth.    . rosuvastatin (CRESTOR) 10 MG tablet Take 1 tablet (10 mg total) by mouth daily.  . TURMERIC PO Take by mouth.  . vitamin E (VITAMIN E) 400 UNIT capsule Take 400 Units by mouth daily.   Facility-Administered Encounter Medications as of 09/25/2017  Medication  . 0.9 %  sodium chloride infusion    Allergies (verified) Norco [hydrocodone-acetaminophen]   History: Past Medical History:  Diagnosis Date  . Allergy   . Arthritis   . Bleeding nose   . Cancer [facility]) 2010     right breast  . Cancer ([place]) 09/2015   brain  . Cataract   . Degenerative disk disease 2012  . Family history of breast cancer in female   . Family history of colon cancer   . Family history of ovarian cancer   . Hemorrhoids   . Hyperlipidemia   . Sinus problem   . Thyroid disease    Past Surgical History:  Procedure Laterality Date  . BRAIN SURGERY    . BREAST SURGERY  AUG. 2010   right breast lumpectomy  . BREAST SURGERY     Cancer  . COLONOSCOPY    . CRANIOTOMY Left 10/16/2015   Procedure: PARA-SAGITTAL CRANIOTOMY WITH STEREOTACTIC NAVIGATION FOR TUMOR;  Surgeon: Kevan Ny Ditty, MD;  Location: [place] [facility];  Service: Neurosurgery;  Laterality: Left;  . GANGLION CYST EXCISION  SEP. 1997   RIGHT WRIST   Family History  Problem Relation Age of Onset  . Colon cancer Mother 27  . Diabetes Father   . Heart disease Father   . Cancer Father        skin  . Breast cancer Brother 39  . Cancer Brother        bone mets  . Leukemia Sister 54       CLL  . Cancer Brother        skin  . Ovarian cancer Maternal Aunt  dx in her 63s  . Tuberculosis Maternal Grandmother   . Heart disease Maternal Grandfather   . Diabetes Paternal Grandmother   . Alcohol abuse Paternal Grandfather   . Esophageal cancer Neg Hx   . Rectal cancer Neg Hx   . Stomach cancer Neg Hx    Social History   Socioeconomic History  . Marital status: Married    Spouse name: Not on file  . Number of children: 1  . Years of education: 55  . Highest education level: 12th grade  Occupational History  . Not on file  Social Needs  . Financial resource strain: Not hard at all  . Food insecurity:    Worry: Never true    Inability: Never true  . Transportation needs:    Medical: No    Non-medical: No  Tobacco Use  . Smoking status: Never Smoker  . Smokeless tobacco: Never Used  Substance and Sexual Activity  . Alcohol use: No  . Drug use: No  . Sexual activity: Yes    Birth  control/protection: Post-menopausal  Lifestyle  . Physical activity:    Days per week: 7 days    Minutes per session: 30 min  . Stress: Only a little  Relationships  . Social connections:    Talks on phone: More than three times a week    Gets together: More than three times a week    Attends religious service: More than 4 times per year    Active member of club or organization: Yes    Attends meetings of clubs or organizations: More than 4 times per year    Relationship status: Married  Other Topics Concern  . Not on file  Social History Narrative  . Not on file    Tobacco Use No.  Clinical Intake:  Nutritional Status: BMI > 30  Obese Diabetes: No  How often do you need to have someone help you when you read instructions, pamphlets, or other written materials from your doctor or pharmacy?: 1 - Never What is the last grade level you completed in school?: some college  Interpreter Needed?: No  Information entered by :: Chong Sicilian, RN   Activities of Daily Living In your present state of health, do you have any difficulty performing the following activities: 09/25/2017  Hearing? N  Vision? N  Difficulty concentrating or making decisions? N  Walking or climbing stairs? N  Dressing or bathing? N  Doing errands, shopping? N  Preparing Food and eating ? N  Using the Toilet? N  In the past six months, have you accidently leaked urine? N  Do you have problems with loss of bowel control? N  Managing your Medications? N  Managing your Finances? N  Housekeeping or managing your Housekeeping? N  Some recent data might be hidden     Diet 3 meals a day  Exercise Current Exercise Habits: Home exercise routine, Type of exercise: walking, Time (Minutes): 45, Frequency (Times/Week): 7, Weekly Exercise (Minutes/Week): 315, Intensity: Mild, Exercise limited by: None identified   Depression Screen PHQ 2/9 Scores 09/25/2017 06/05/2017 03/21/2017 12/30/2016 12/06/2016 08/28/2016  06/03/2016  PHQ - 2 Score 0 0 0 0 0 0 0     Fall Risk Fall Risk  09/25/2017 06/05/2017 03/21/2017 12/30/2016 12/06/2016  Falls in the past year? No No No No No    Safety Is the patient's home free of loose throw rugs in walkways, pet beds, electrical cords, etc?   yes  Grab bars in the bathroom? no      Walkin shower? no      Shower Seat? no      Handrails on the stairs?   yes      Adequate lighting?   yes  Patient Care Team: Chevis Pretty, FNP as PCP - General (Nurse Practitioner) Glenna Fellows, MD as Attending Physician (Neurosurgery) Ditty, Kevan Ny, MD as Consulting Physician (Neurosurgery) Merilyn Baba, MD as Referring Physician (Internal Medicine) Armbruster, Carlota Raspberry, MD as Consulting Physician (Gastroenterology)   No hospitalizations, ER visits, or surgeries this past year.  Objective:    Today's Vitals   09/25/17 1411  BP: 125/66  Pulse: 74  Weight: 196 lb (88.9 kg)  Height: 5' 4.25" (1.632 m)   Body mass index is 33.38 kg/m.  Advanced Directives 09/25/2017 08/28/2016 08/09/2016 07/26/2016 11/15/2015 10/18/2015 10/13/2015  Does Patient Have a Medical Advance Directive? No No No No No No No  Would patient like information on creating a medical advance directive? No - Patient declined Yes (MAU/Ambulatory/Procedural Areas - Information given) - - Yes - Educational materials given - Yes - Educational materials given    Hearing/Vision  normal or No deficits noted during visit.  Cognitive Function: MMSE - Mini Mental State Exam 09/25/2017 08/28/2016  Orientation to time 4 5  Orientation to Place 5 5  Registration 3 3  Attention/ Calculation 5 5  Recall 3 3  Language- name 2 objects 2 2  Language- repeat 1 1  Language- follow 3 step command 3 3  Language- read & follow direction 1 1  Write a sentence 1 1  Copy design 1 1  Total score 29 30       Normal Cognitive Function Screening: Yes    Immunizations and Health Maintenance Immunization History   Administered Date(s) Administered  . Influenza,inj,Quad PF,6+ Mos 01/01/2013, 01/19/2014, 02/09/2015, 02/26/2016  . Influenza-Unspecified 12/30/2013  . Zoster 05/19/2015   There are no preventive care reminders to display for this patient. Health Maintenance  Topic Date Due  . INFLUENZA VACCINE  11/21/2017 (Originally 10/30/2017)  . HIV Screening  12/06/2017 (Originally 10/31/1968)  . TETANUS/TDAP  09/26/2018 (Originally 10/31/1972)  . MAMMOGRAM  11/30/2017  . PAP SMEAR  07/26/2018  . DEXA SCAN  09/25/2018  . COLONOSCOPY  08/09/2021  . Hepatitis C Screening  Completed        Assessment:   This is a routine wellness examination for Julia Poole.    Plan:    Goals    . Exercise 150 min/wk Moderate Activity        Health Maintenance Recommendations: Td vaccine Advanced directives: has NO advanced directive - not interested in additional information   Additional Screening Recommendations: Lung: Low Dose CT Chest recommended if Age 59-80 years, 30 pack-year currently smoking OR have quit w/in 15years. Patient does not qualify. Hepatitis C Screening recommended: no-completed  Today's Orders No orders of the defined types were placed in this encounter.   Keep f/u with Chevis Pretty, FNP and any other specialty appointments you may have Continue current medications Move carefully to avoid falls. Use assistive devices like a can or walker if needed. Aim for at least 150 minutes of moderate activity a week. This can be done with chair exercises if necessary. Read or work on puzzles daily Stay connected with friends and family  I have personally reviewed and noted the following in the patient's chart:   . Medical and social history . Use of alcohol, tobacco or illicit  drugs  . Current medications and supplements . Functional ability and status . Nutritional status . Physical activity . Advanced directives . List of other physicians . Hospitalizations, surgeries, and  ER visits in previous 12 months . Vitals . Screenings to include cognitive, depression, and falls . Referrals and appointments  In addition, I have reviewed and discussed with patient certain preventive protocols, quality metrics, and best practice recommendations. A written personalized care plan for preventive services as well as general preventive health recommendations were provided to patient.     Chong Sicilian, RN   09/25/2017   I have reviewed and agree with the above AWV documentation.   [name] Done, FNP

## 2017-10-03 ENCOUNTER — Other Ambulatory Visit: Payer: Self-pay | Admitting: Nurse Practitioner

## 2017-10-03 ENCOUNTER — Ambulatory Visit: Payer: Medicare Other | Admitting: Nurse Practitioner

## 2017-10-03 DIAGNOSIS — M549 Dorsalgia, unspecified: Principal | ICD-10-CM

## 2017-10-03 DIAGNOSIS — G8929 Other chronic pain: Secondary | ICD-10-CM

## 2017-10-03 MED ORDER — ACETAMINOPHEN-CODEINE #3 300-30 MG PO TABS: 1.0000 | ORAL_TABLET | Freq: Four times a day (QID) | ORAL | 0 refills | Status: AC | PRN

## 2017-10-25 ENCOUNTER — Ambulatory Visit (INDEPENDENT_AMBULATORY_CARE_PROVIDER_SITE_OTHER): Payer: Medicare Other | Admitting: Pediatrics

## 2017-10-25 VITALS — BP 116/65 | HR 70 | Temp 97.7°F | Ht 64.25 in | Wt 190.0 lb

## 2017-10-25 DIAGNOSIS — N3090 Cystitis, unspecified without hematuria: Secondary | ICD-10-CM | POA: Diagnosis not present

## 2017-10-25 DIAGNOSIS — R300 Dysuria: Secondary | ICD-10-CM | POA: Diagnosis not present

## 2017-10-25 DIAGNOSIS — N390 Urinary tract infection, site not specified: Secondary | ICD-10-CM | POA: Diagnosis not present

## 2017-10-25 MED ORDER — NITROFURANTOIN MONOHYD MACRO 100 MG PO CAPS: ORAL_CAPSULE | ORAL | 0 refills | Status: DC

## 2017-10-25 NOTE — Addendum Note (Signed)
Addended by: [name] on: 10/25/2017 10:11 AM   Modules accepted: Orders

## 2017-10-25 NOTE — Progress Notes (Signed)
  Subjective:   Patient ID: Julia Poole, female    DOB: [birth year], 62 y.o.   MRN: [id number] CC: Urinary Tract Infection (dysuria and urgency since yesterday. Notices this happens after sex. Has had an antibiotic to take with sex in the past. Has taken AZO)  HPI: Julia Poole is a 62 y.o. female   Symptoms started yesterday, dysuria with each void, lower abdominal pain.  + Urinary urgency and frequency.  Intercourse 2 days ago.  In the past she was prescribed Cipro to take as needed for similar symptoms.  The prescription is old she did take half a tablet of Cipro x2 doses yesterday and today.  No improvement in symptoms.  Relevant past medical, surgical, family and social history reviewed. Allergies and medications reviewed and updated. Social History   Tobacco Use  Smoking Status Never Smoker  Smokeless Tobacco Never Used   ROS: Per HPI   Objective:    BP 116/65 (BP Location: Left Arm, Patient Position: Sitting, Cuff Size: Large)   Pulse 70   Temp 97.7 F (36.5 C) (Oral)   Ht 5' 4.25" (1.632 m)   Wt 190 lb (86.2 kg)   BMI 32.36 kg/m   Wt Readings from Last 3 Encounters:  10/25/17 190 lb (86.2 kg)  09/25/17 196 lb (88.9 kg)  06/05/17 199 lb (90.3 kg)    Gen: NAD, alert, cooperative with exam, NCAT EYES: EOMI, no conjunctival injection, or no icterus CV: NRRR, normal S1/S2, no murmur, distal pulses 2+ b/l Resp: CTABL, no wheezes, normal WOB Abd: +BS, soft, NTND. no guarding or organomegaly, no CVA tenderness Ext: No edema, warm Neuro: Alert and oriented, strength equal b/l UE and LE, coordination grossly normal MSK: normal muscle bulk  Assessment & Plan:  Leaner was seen today for urinary tract infection.  Diagnoses and all orders for this visit:  Cystitis Take below for 5 days.  Urine culture may be negative because patient was pretreated with antibiotics.  She did have nit+ on UA. -     nitrofurantoin, macrocrystal-monohydrate, (MACROBID) 100 MG capsule; Take  twice a day for 5 days. Then take 1 tab within 2 hours of intercourse for prophylactic.  Dysuria -     Urinalysis  Frequent UTI Take nitrofurantoin 1 tab within 2 hours of intercourse to help prevent UTIs.  Follow up plan: No follow-ups on file. Assunta Found, MD [place]

## 2017-10-26 LAB — URINE CULTURE

## 2017-10-27 LAB — URINALYSIS
BILIRUBIN UA: NEGATIVE
KETONES UA: NEGATIVE
Nitrite, UA: POSITIVE — AB
PH UA: 5.5 (ref 5.0–7.5)
SPEC GRAV UA: 1.025 (ref 1.005–1.030)
Urobilinogen, Ur: 1 mg/dL (ref 0.2–1.0)

## 2017-12-01 IMAGING — MR MR HEAD WO/W CM
10 of 12 series · 28 of 48 positions shown · IV contrast (multihance)
Comparison: 10/16/2015

CLINICAL DATA: Surgery for benign brain tumor yesterday. Follow-up.

EXAM:
MRI HEAD WITHOUT AND WITH CONTRAST
TECHNIQUE: Multiplanar, multiecho pulse sequences of the brain and surrounding
structures were obtained without and with intravenous contrast.
CONTRAST:  20mL MULTIHANCE GADOBENATE DIMEGLUMINE 529 MG/ML IV SOLN

[Series 2: FLAIR · sagittal · 3.0mm · 0.47mm/px · 1 of 36 slices shown (1 of 3)]
[im 1/36]
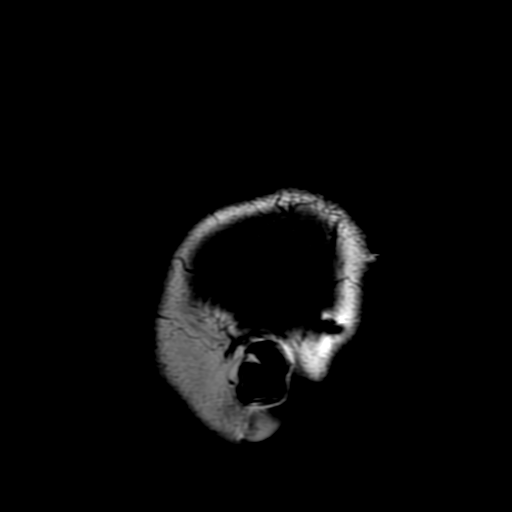

[Series 4: DWI · axial · 5.0mm · 1.09mm/px · z∈[-107,+34]mm · 3 of 52 slices shown]
[im 1/52]
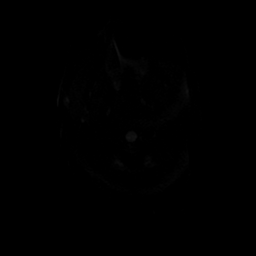
[im 26/52]
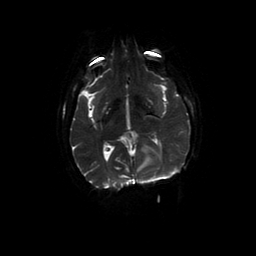
[im 52/52]
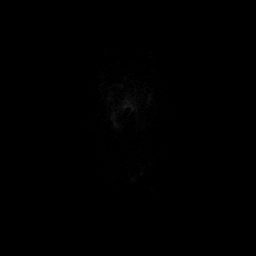

[Series 5: T2 · axial · 5.0mm · 0.43mm/px · z∈[-105,+36]mm · 2 of 25 slices shown]
[im 1/25]
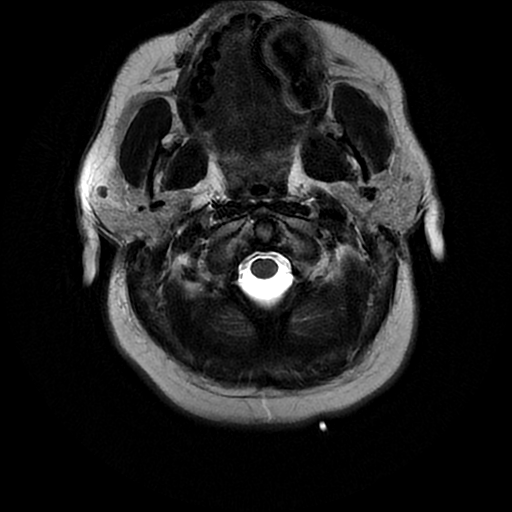
[im 25/25]
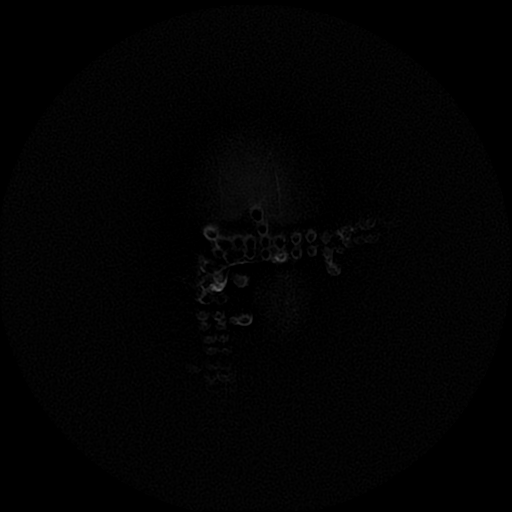

[Series 6: FLAIR · axial · 5.0mm · 0.43mm/px · z∈[-105,+36]mm · 2 of 25 slices shown (2 of 3)]
[im 1/25]
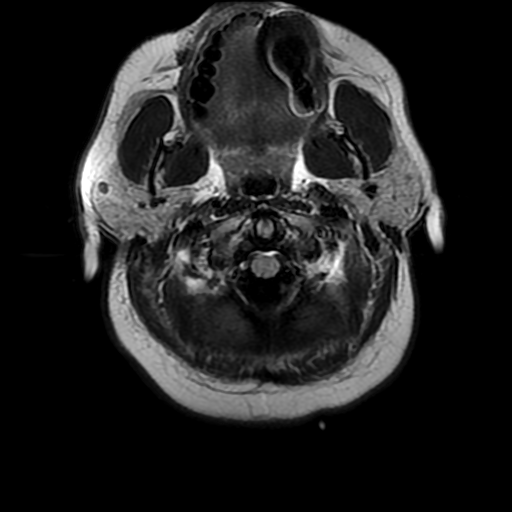
[im 25/25]
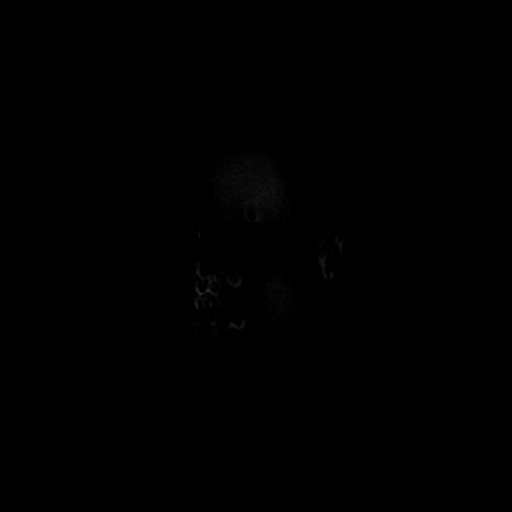

[Series 7: ax 3(person_name) · axial · 1.0mm · 0.94mm/px · z∈[-106,+53]mm · 8 of 160 slices shown]
[im 1/160]
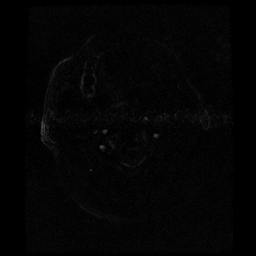
[im 18/160]
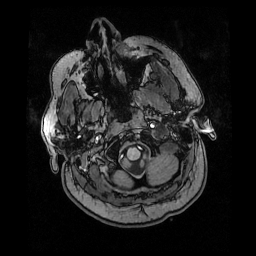
[im 54/160]
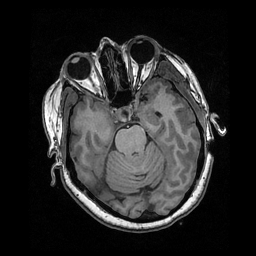
[im 71/160]
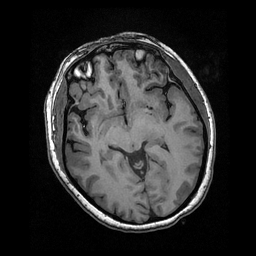
[im 89/160]
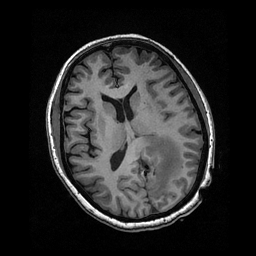
[im 107/160]
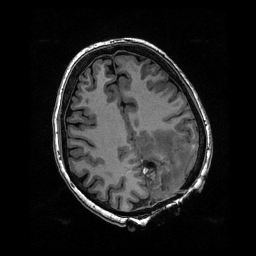
[im 142/160]
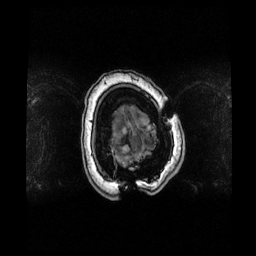
[im 160/160]
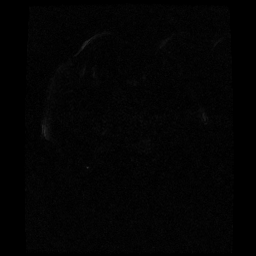

[Series 9: (person_name) · axial · 3.0mm · 0.47mm/px · z∈[-108,-20]mm · 4 of 100 slices shown]
[im 1/100]
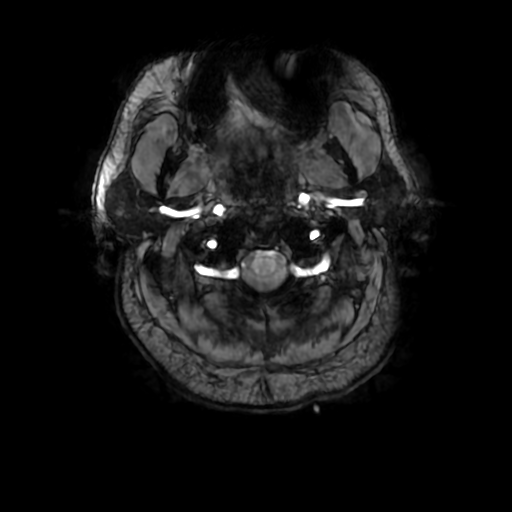
[im 20/100]
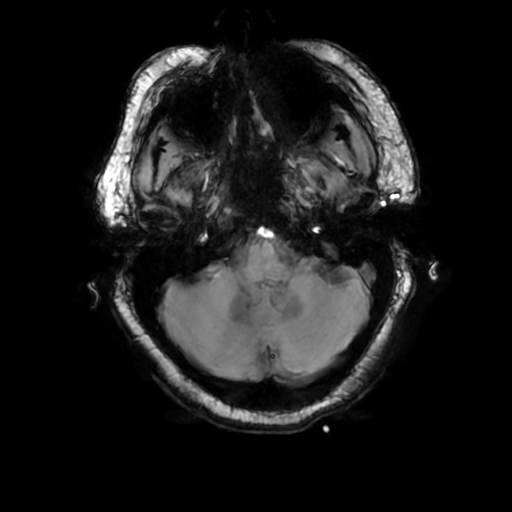
[im 40/100]
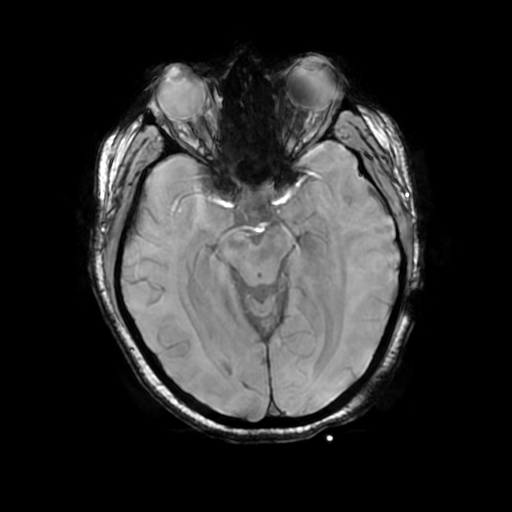
[im 60/100]
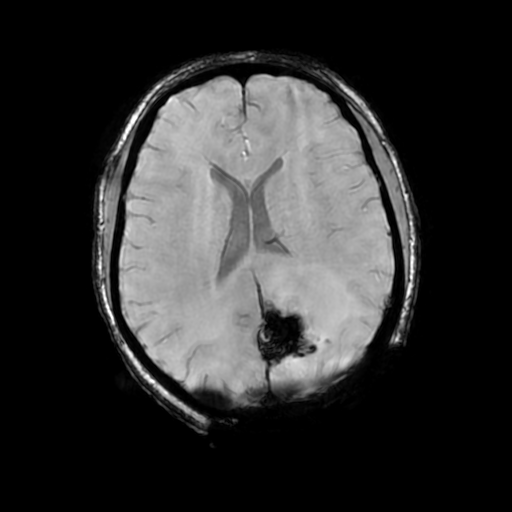

[Series 10: T2 post-contrast · coronal · 5.0mm · 0.39mm/px · 2 of 30 slices shown]
[im 1/30]
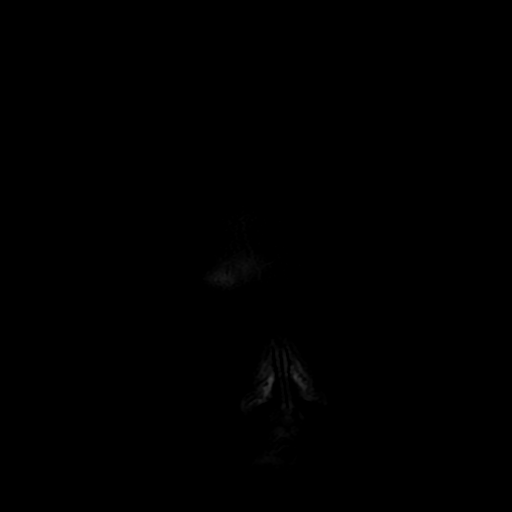
[im 30/30]
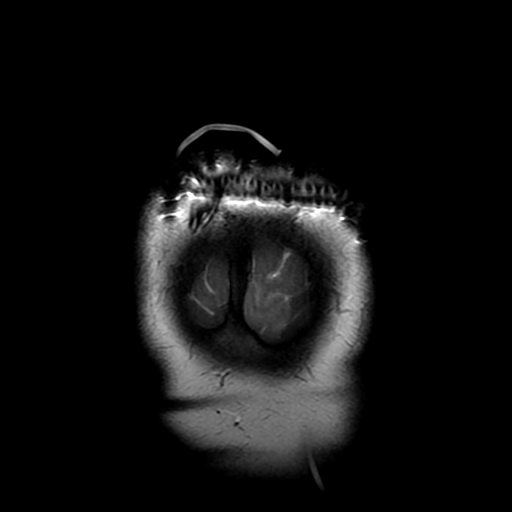

[Series 12: T1 · coronal · 5.0mm · 0.39mm/px · 2 of 30 slices shown]
[im 1/30]
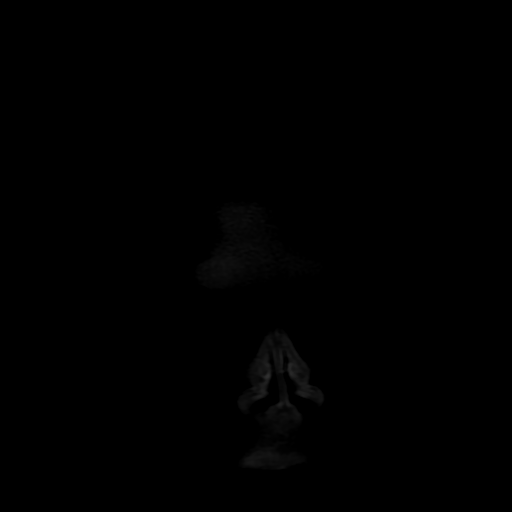
[im 30/30]
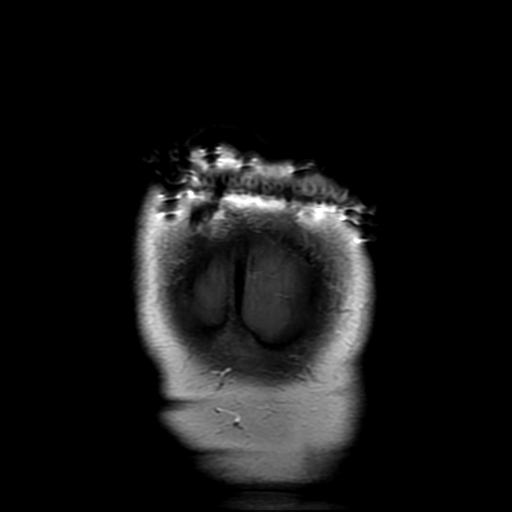

[Series 13: FLAIR · sagittal · 3.0mm · 0.47mm/px · 2 of 36 slices shown (3 of 3)]
[im 1/36]
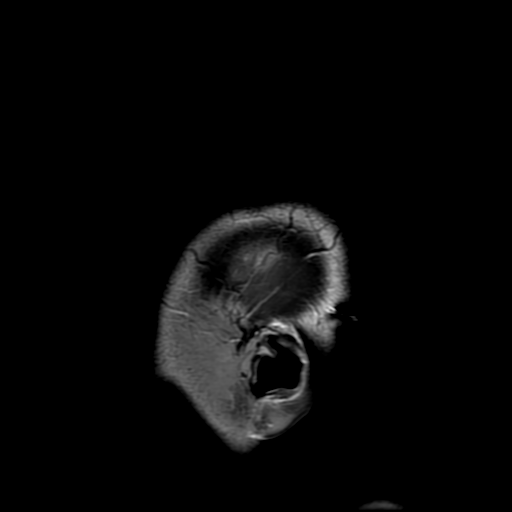
[im 36/36]
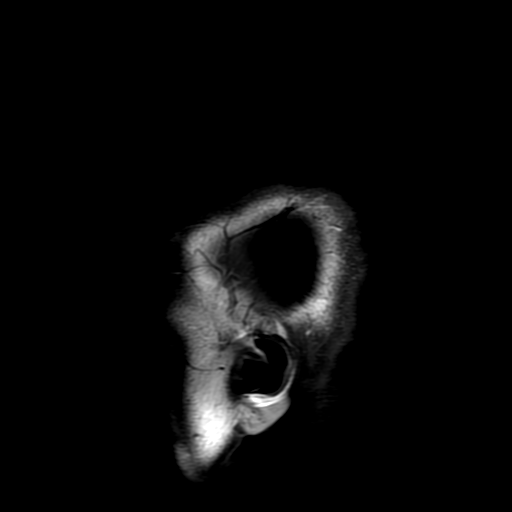

[Series 450: ADC · axial · 5.0mm · 1.09mm/px · z∈[-107,+34]mm · 2 of 27 slices shown]
[im 1/27]
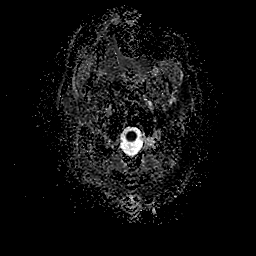
[im 27/27]
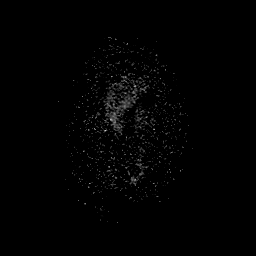

[28 of 48 positions shown; findings below may reference images not displayed]

FINDINGS: Brainstem and cerebellum again appear normal. There has been
previous posterior parietal craniotomy for resection of a left
posterior falcine meningioma. There is a small amount of blood
products in the post resection space. There is no evidence of
residual enhancing tumor in this location. Edema remains present
within the posterior left brain.

Three other meningiomas are unchanged. 7 mm meningioma at the right
frontal convexity is unchanged. 15 mm meningioma projecting right
word from the falx is unchanged. 7 mm meningioma along the left
parietal convexity is unchanged.

Major arterial and venous structures remain patent. No evidence of
perioperative infarction. No extra-axial collection.
IMPRESSION: Good appearance following what appears to be a complete resection of
the left posterior falcine meningioma. Small amount of blood
products at the site of resection as expected. Digital brain edema
as expected. No evidence of perioperative infarction or extra-axial
collection.

Three other meningioma is have not changed, as described above.

## 2017-12-04 ENCOUNTER — Ambulatory Visit: Payer: Medicare Other | Admitting: Nurse Practitioner

## 2017-12-10 DIAGNOSIS — Z1231 Encounter for screening mammogram for malignant neoplasm of breast: Secondary | ICD-10-CM | POA: Diagnosis not present

## 2017-12-10 DIAGNOSIS — Z853 Personal history of malignant neoplasm of breast: Secondary | ICD-10-CM | POA: Diagnosis not present

## 2018-01-06 ENCOUNTER — Ambulatory Visit (INDEPENDENT_AMBULATORY_CARE_PROVIDER_SITE_OTHER): Payer: Medicare Other | Admitting: Nurse Practitioner

## 2018-01-06 ENCOUNTER — Encounter: Payer: Self-pay | Admitting: Nurse Practitioner

## 2018-01-06 VITALS — BP 111/71 | HR 74 | Temp 97.9°F | Ht 64.0 in | Wt 196.0 lb

## 2018-01-06 DIAGNOSIS — E782 Mixed hyperlipidemia: Secondary | ICD-10-CM

## 2018-01-06 DIAGNOSIS — Z6833 Body mass index (BMI) 33.0-33.9, adult: Secondary | ICD-10-CM | POA: Diagnosis not present

## 2018-01-06 DIAGNOSIS — M4316 Spondylolisthesis, lumbar region: Secondary | ICD-10-CM

## 2018-01-06 DIAGNOSIS — E034 Atrophy of thyroid (acquired): Secondary | ICD-10-CM

## 2018-01-06 DIAGNOSIS — D420 Neoplasm of uncertain behavior of cerebral meninges: Secondary | ICD-10-CM | POA: Diagnosis not present

## 2018-01-06 DIAGNOSIS — Z23 Encounter for immunization: Secondary | ICD-10-CM | POA: Diagnosis not present

## 2018-01-06 MED ORDER — ACETAMINOPHEN-CODEINE #3 300-30 MG PO TABS: 1.0000 | ORAL_TABLET | Freq: Four times a day (QID) | ORAL | 0 refills | Status: AC | PRN

## 2018-01-06 MED ORDER — LEVOTHYROXINE SODIUM 50 MCG PO TABS: 50.0000 ug | ORAL_TABLET | Freq: Every day | ORAL | 1 refills | Status: DC

## 2018-01-06 MED ORDER — ROSUVASTATIN CALCIUM 10 MG PO TABS: 10.0000 mg | ORAL_TABLET | Freq: Every day | ORAL | 1 refills | Status: DC

## 2018-01-06 NOTE — Patient Instructions (Signed)

## 2018-01-06 NOTE — Progress Notes (Signed)
Subjective:    Patient ID: Julia Poole, female    DOB: 04-12-1955, 62 y.o.   MRN: [id number]   Chief Complaint: Medical Management of Chronic Issues   HPI:  1. Hypothyroidism due to acquired atrophy of thyroid  Not having any problems that she is aware of.  2. Atypical meningioma of brain [facility])  Had emergency brain surgery. Has finished all of her treatments and is doing well.  3. Spondylolisthesis of lumbar region  Pain assessment: Cause of pain- spondylolisthesis Pain location- lumbar spine Pain on scale of 1-10- 2/10 Frequency- daily What increases pain-lots of activity What makes pain Better-rest Effects on ADL - does t affect ADL's Any change in general medical condition-none  Current medications- tylenol #3 up to 4x a day- most days she only take 2 Effectiveness of current meds-works well for her Adverse reactions form pain meds-none Morphine equivalent- 18  Pill count performed-No Urine drug screen- No Was the [place] reviewed- yes  If yes were their any concerning findings? - none  Pain contract signed on:01/06/18   4. Mixed hyperlipidemia  does not really watch diet and is unable to exercise due to back pain.  5. BMI 31.0-31.9,adult  Weight is up 6lbs since last visit.    Outpatient Encounter Medications as of 01/06/2018  Medication Sig  . Ascorbic Acid (VITAMIN C) 500 MG tablet Take 500 mg by mouth daily.   Marland Kitchen aspirin 81 MG tablet Take 81 mg by mouth daily.    . Cholecalciferol (VITAMIN D) 2000 units CAPS Take by mouth daily.  . Cholecalciferol (VITAMIN D) 2000 UNITS tablet Take 2,000 Units by mouth 2 (two) times daily.  . COCONUT OIL PO Take by mouth.  . cyanocobalamin 100 MCG tablet Take 100 mcg by mouth daily.  Marland Kitchen FLAXSEED, LINSEED, PO Take by mouth.  Marland Kitchen GARLIC PO Take 5,790 mg by mouth daily.  . Glucosamine-Chondroitin (OSTEO BI-FLEX REGULAR STRENGTH PO) Take by mouth.  . levothyroxine (SYNTHROID, LEVOTHROID) 50 MCG tablet Take 1 tablet (50 mcg total)  by mouth daily.  . Multiple Vitamins-Minerals (CENTRUM SILVER PO) Take by mouth.    . rosuvastatin (CRESTOR) 10 MG tablet Take 1 tablet (10 mg total) by mouth daily.  . TURMERIC PO Take by mouth.  . vitamin E (VITAMIN E) 400 UNIT capsule Take 400 Units by mouth daily.       New complaints: None today  Social history: Is retired - lives with her husband and her son.   Review of Systems  Constitutional: Negative for activity change and appetite change.  HENT: Negative.   Eyes: Negative for pain.  Respiratory: Negative for shortness of breath.   Cardiovascular: Negative for chest pain, palpitations and leg swelling.  Gastrointestinal: Negative for abdominal pain.  Endocrine: Negative for polydipsia.  Genitourinary: Negative.   Skin: Negative for rash.  Neurological: Negative for dizziness, weakness and headaches.  Hematological: Does not bruise/bleed easily.  Psychiatric/Behavioral: Negative.   All other systems reviewed and are negative.      Objective:   Physical Exam  Constitutional: She is oriented to person, place, and time. She appears well-developed and well-nourished. No distress.  HENT:  Head: Normocephalic.  Nose: Nose normal.  Mouth/Throat: Oropharynx is clear and moist.  Eyes: Pupils are equal, round, and reactive to light. EOM are normal.  Neck: Normal range of motion. Neck supple. No JVD present. Carotid bruit is not present.  Cardiovascular: Normal rate, regular rhythm, normal heart sounds and intact distal pulses.  Pulmonary/Chest: Effort  normal and breath sounds normal. No respiratory distress. She has no wheezes. She has no rales. She exhibits no tenderness.  Abdominal: Soft. Normal appearance, normal aorta and bowel sounds are normal. She exhibits no distension, no abdominal bruit, no pulsatile midline mass and no mass. There is no splenomegaly or hepatomegaly. There is no tenderness.  Musculoskeletal: Normal range of motion. She exhibits no edema.    Lymphadenopathy:    She has no cervical adenopathy.  Neurological: She is alert and oriented to person, place, and time. She has normal reflexes.  Skin: Skin is warm and dry.  Psychiatric: She has a normal mood and affect. Her behavior is normal. Judgment and thought content normal.  Nursing note and vitals reviewed.  BP 111/71   Pulse 74   Temp 97.9 F (36.6 C) (Oral)   Ht '5\' 4"'  (1.626 m)   Wt 196 lb (88.9 kg)   BMI 33.64 kg/m       Assessment & Plan:  Julia Poole comes in today with chief complaint of Medical Management of Chronic Issues   Diagnosis and orders addressed:  1. Hypothyroidism due to acquired atrophy of thyroid - levothyroxine (SYNTHROID, LEVOTHROID) 50 MCG tablet; Take 1 tablet (50 mcg total) by mouth daily.  Dispense: 90 tablet; Refill: 1 - Thyroid Panel With TSH  2. Atypical meningioma of brain [facility]) Keep follow ups  with specialist as needed  3. Spondylolisthesis of lumbar region Back exercises discussed - acetaminophen-codeine (TYLENOL #3) 300-30 MG tablet; Take 1 tablet by mouth every 6 (six) hours as needed for moderate pain.  Dispense: 120 tablet; Refill: 0 - acetaminophen-codeine (TYLENOL #3) 300-30 MG tablet; Take 1 tablet by mouth every 6 (six) hours as needed for moderate pain.  Dispense: 120 tablet; Refill: 0 - acetaminophen-codeine (TYLENOL #3) 300-30 MG tablet; Take 1 tablet by mouth every 6 (six) hours as needed for moderate pain.  Dispense: 120 tablet; Refill: 0  4. Mixed hyperlipidemia Low fat diet - rosuvastatin (CRESTOR) 10 MG tablet; Take 1 tablet (10 mg total) by mouth daily.  Dispense: 90 tablet; Refill: 1 - CMP14+EGFR - Lipid panel  5. BMI 33.0-33.9,adult Discussed diet and exercise for person with BMI >25 Will recheck weight in 3-6 months   Labs pending Health Maintenance reviewed Diet and exercise encouraged  Follow up plan: 6 months   [name] Done, FNP

## 2018-01-07 LAB — CMP14+EGFR
A/G RATIO: 2.3 — AB (ref 1.2–2.2)
ALT: 22 IU/L (ref 0–32)
AST: 23 IU/L (ref 0–40)
Albumin: 4.5 g/dL (ref 3.6–4.8)
Alkaline Phosphatase: 81 IU/L (ref 39–117)
BILIRUBIN TOTAL: 0.9 mg/dL (ref 0.0–1.2)
BUN/Creatinine Ratio: 19 (ref 12–28)
BUN: 13 mg/dL (ref 8–27)
CALCIUM: 9.5 mg/dL (ref 8.7–10.3)
CHLORIDE: 103 mmol/L (ref 96–106)
CO2: 22 mmol/L (ref 20–29)
Creatinine, Ser: 0.7 mg/dL (ref 0.57–1.00)
GFR calc Af Amer: 106 mL/min/{1.73_m2} (ref >59)
GFR, EST NON AFRICAN AMERICAN: 92 mL/min/{1.73_m2} (ref >59)
GLUCOSE: 99 mg/dL (ref 65–99)
Globulin, Total: 2 g/dL (ref 1.5–4.5)
POTASSIUM: 4.2 mmol/L (ref 3.5–5.2)
Sodium: 140 mmol/L (ref 134–144)
Total Protein: 6.5 g/dL (ref 6.0–8.5)

## 2018-01-07 LAB — THYROID PANEL WITH TSH
FREE THYROXINE INDEX: 2.1 (ref 1.2–4.9)
T3 Uptake Ratio: 27 % (ref 24–39)
T4, Total: 7.6 ug/dL (ref 4.5–12.0)
TSH: 1.44 u[IU]/mL (ref 0.450–4.500)

## 2018-01-07 LAB — LIPID PANEL
Chol/HDL Ratio: 2.8 ratio (ref 0.0–4.4)
Cholesterol, Total: 135 mg/dL (ref 100–199)
HDL: 48 mg/dL (ref >39)
LDL Calculated: 69 mg/dL (ref 0–99)
TRIGLYCERIDES: 92 mg/dL (ref 0–149)
VLDL Cholesterol Cal: 18 mg/dL (ref 5–40)

## 2018-01-21 ENCOUNTER — Other Ambulatory Visit: Payer: Self-pay | Admitting: Neurosurgery

## 2018-01-21 DIAGNOSIS — D329 Benign neoplasm of meninges, unspecified: Secondary | ICD-10-CM

## 2018-02-20 ENCOUNTER — Other Ambulatory Visit: Payer: Medicare Other

## 2018-03-12 ENCOUNTER — Ambulatory Visit
Admission: RE | Admit: 2018-03-12 | Discharge: 2018-03-12 | Disposition: A | Discharge status: Home / Self Care | Payer: Medicare Other | Source: Ambulatory Visit | Attending: Neurosurgery | Admitting: Neurosurgery

## 2018-03-12 DIAGNOSIS — D329 Benign neoplasm of meninges, unspecified: Secondary | ICD-10-CM | POA: Diagnosis not present

## 2018-03-12 MED ORDER — GADOBENATE DIMEGLUMINE 529 MG/ML IV SOLN
18.0000 mL | Freq: Once | INTRAVENOUS | Status: AC | PRN
  Administered 2018-03-12: 18 mL via INTRAVENOUS

## 2018-03-17 DIAGNOSIS — Z6832 Body mass index (BMI) 32.0-32.9, adult: Secondary | ICD-10-CM | POA: Diagnosis not present

## 2018-03-17 DIAGNOSIS — M4316 Spondylolisthesis, lumbar region: Secondary | ICD-10-CM | POA: Diagnosis not present

## 2018-03-17 DIAGNOSIS — M545 Low back pain: Secondary | ICD-10-CM | POA: Diagnosis not present

## 2018-03-18 DIAGNOSIS — R030 Elevated blood-pressure reading, without diagnosis of hypertension: Secondary | ICD-10-CM | POA: Diagnosis not present

## 2018-03-18 DIAGNOSIS — Z6832 Body mass index (BMI) 32.0-32.9, adult: Secondary | ICD-10-CM | POA: Diagnosis not present

## 2018-03-18 DIAGNOSIS — D329 Benign neoplasm of meninges, unspecified: Secondary | ICD-10-CM | POA: Diagnosis not present

## 2018-04-18 ENCOUNTER — Ambulatory Visit: Payer: Medicare Other

## 2018-04-18 ENCOUNTER — Ambulatory Visit (INDEPENDENT_AMBULATORY_CARE_PROVIDER_SITE_OTHER): Payer: Medicare Other | Admitting: Family Medicine

## 2018-04-18 VITALS — BP 112/62 | HR 89 | Temp 97.8°F | Wt 193.4 lb

## 2018-04-18 DIAGNOSIS — J019 Acute sinusitis, unspecified: Secondary | ICD-10-CM

## 2018-04-18 DIAGNOSIS — B9689 Other specified bacterial agents as the cause of diseases classified elsewhere: Secondary | ICD-10-CM

## 2018-04-18 MED ORDER — AMOXICILLIN-POT CLAVULANATE 875-125 MG PO TABS: 1.0000 | ORAL_TABLET | Freq: Two times a day (BID) | ORAL | 0 refills | Status: AC

## 2018-04-18 MED ORDER — FLUTICASONE PROPIONATE 50 MCG/ACT NA SUSP: 2.0000 | Freq: Every day | NASAL | 6 refills | Status: DC

## 2018-04-18 NOTE — Patient Instructions (Signed)

## 2018-04-18 NOTE — Progress Notes (Signed)
    Subjective:     Julia Poole is a 63 y.o. female who presents for evaluation of sinus pain. Symptoms include: congestion, cough, facial pain, fevers, headaches, nasal congestion, post nasal drip, sinus pressure and sore throat. Onset of symptoms was several days ago. Symptoms have been gradually worsening since that time. Past history is significant for no history of pneumonia or bronchitis. Patient is a non-smoker.  The following portions of the patient's history were reviewed and updated as appropriate: allergies, current medications, past family history, past medical history, past social history, past surgical history and problem list.  Review of Systems Constitutional: positive for chills, fatigue, fevers and malaise Eyes: negative Ears, nose, mouth, throat, and face: positive for nasal congestion, sore throat and ear fullness and sinus pressure Respiratory: positive for cough Cardiovascular: negative Gastrointestinal: negative Musculoskeletal:positive for myalgias Neurological: positive for headaches   Objective:    BP 112/62   Pulse 89   Temp 97.8 F (36.6 C) (Oral)   Wt 193 lb 6.4 oz (87.7 kg)   BMI 33.20 kg/m  General appearance: alert, cooperative and mild distress Head: Normocephalic, without obvious abnormality, atraumatic Eyes: negative Ears: abnormal TM right ear - serous middle ear fluid and abnormal TM left ear - serous middle ear fluid Nose: scant and yellow discharge, moderate congestion, turbinates red, swollen, sinus tenderness bilateral Throat: abnormal findings: mild oropharyngeal erythema and postnasal drainage Neck: mild anterior cervical adenopathy, no carotid bruit, no JVD, supple, symmetrical, trachea midline and thyroid not enlarged, symmetric, no tenderness/mass/nodules Lungs: clear to auscultation bilaterally Heart: regular rate and rhythm, S1, S2 normal, no murmur, click, rub or gallop Skin: Skin color, texture, turgor normal. No rashes or  lesions Lymph nodes: Cervical adenopathy: mild Neurologic: Grossly normal    Assessment:     Alyssah was seen today for cough, nasal congestion and generalized body aches.  Diagnoses and all orders for this visit:  Acute bacterial rhinosinusitis -     amoxicillin-clavulanate (AUGMENTIN) 875-125 MG tablet; Take 1 tablet by mouth 2 (two) times daily for 7 days. -     fluticasone (FLONASE) 50 MCG/ACT nasal spray; Place 2 sprays into both nostrils daily.    Plan:    Nasal saline sprays. Nasal steroids per medication orders. Augmentin per medication orders. Report any new or worsening symptoms. Follow up as needed.     The above assessment and management plan was discussed with the patient. The patient verbalized understanding of and has agreed to the management plan. Patient is aware to call the clinic if symptoms fail to improve or worsen. Patient is aware when to return to the clinic for a follow-up visit. Patient educated on when it is appropriate to go to the emergency department.   Monia Pouch, FNP-C [facility] [address] [place], [place] [address] [phone]

## 2018-07-09 ENCOUNTER — Ambulatory Visit (INDEPENDENT_AMBULATORY_CARE_PROVIDER_SITE_OTHER): Payer: Medicare Other | Admitting: Nurse Practitioner

## 2018-07-09 ENCOUNTER — Encounter: Payer: Self-pay | Admitting: Nurse Practitioner

## 2018-07-09 ENCOUNTER — Other Ambulatory Visit: Payer: Self-pay

## 2018-07-09 DIAGNOSIS — G8929 Other chronic pain: Secondary | ICD-10-CM

## 2018-07-09 DIAGNOSIS — M545 Low back pain, unspecified: Secondary | ICD-10-CM

## 2018-07-09 DIAGNOSIS — E034 Atrophy of thyroid (acquired): Secondary | ICD-10-CM

## 2018-07-09 DIAGNOSIS — Z6831 Body mass index (BMI) 31.0-31.9, adult: Secondary | ICD-10-CM | POA: Diagnosis not present

## 2018-07-09 DIAGNOSIS — D420 Neoplasm of uncertain behavior of cerebral meninges: Secondary | ICD-10-CM | POA: Diagnosis not present

## 2018-07-09 DIAGNOSIS — E782 Mixed hyperlipidemia: Secondary | ICD-10-CM

## 2018-07-09 DIAGNOSIS — M549 Dorsalgia, unspecified: Secondary | ICD-10-CM

## 2018-07-09 MED ORDER — ROSUVASTATIN CALCIUM 10 MG PO TABS: 10.0000 mg | ORAL_TABLET | Freq: Every day | ORAL | 1 refills | Status: DC

## 2018-07-09 MED ORDER — ACETAMINOPHEN-CODEINE #3 300-30 MG PO TABS: 1.0000 | ORAL_TABLET | Freq: Four times a day (QID) | ORAL | 2 refills | Status: DC | PRN

## 2018-07-09 MED ORDER — LEVOTHYROXINE SODIUM 50 MCG PO TABS: 50.0000 ug | ORAL_TABLET | Freq: Every day | ORAL | 1 refills | Status: DC

## 2018-07-09 NOTE — Progress Notes (Signed)
Patient ID: Julia Poole, female   DOB: 01-Sep-1955, 63 y.o.   MRN: [id number]     Virtual Visit via telephone Note  I connected with Julia Poole on 07/09/18 at 9:45AM by telephone and verified that I am speaking with the correct person using two identifiers. Julia Poole is currently located at home and her husband is currently with her during visit. The provider, [name] Done, FNP is located in their office at time of visit.  I discussed the limitations, risks, security and privacy concerns of performing an evaluation and management service by telephone and the availability of in person appointments. I also discussed with the patient that there may be a patient responsible charge related to this service. The patient expressed understanding and agreed to proceed.   History and Present Illness:   Chief Complaint: Medical Management of Chronic Issues   HPI:  1. Mixed hyperlipidemia  Does not really watch diet all that well. Does no exercise  2. Hypothyroidism due to acquired atrophy of thyroid  Not having any problems that aware ot  3. Atypical meningioma of brain ([place])  Has had no consfusion, headache or speaking problems or visual changes  4. BMI 31.0-31.9,adult  No recent weight chanages  5.      Chronic back pain           She has had for years. Is on tylenol #34x a  Day.  Pain assessment: Cause of pain- DDD Pain location- low back pain Pain on scale of 1-10- 6/10 currrently Frequency- daily What increases pain-lots of standing What makes pain Better-rest Effects on ADL - none Any change in general medical condition-none  Current opioids rx- tylenol #3 # meds rx- 120 Effectiveness of current meds-helps but never completely relieves pain Adverse reactions form pain meds-none Morphine equivalent- 18 MEDD  Pill count performed-No Last drug screen - not done- will do at next visit ( high risk q39m, moderate risk q73m, low risk yearly ) Urine drug screen today-  No Was the [place] reviewed- yes  If yes were their any concerning findings? - none   Pain contract signed on: 01/09/18    Outpatient Encounter Medications as of 07/09/2018  Medication Sig   Ascorbic Acid (VITAMIN C) 500 MG tablet Take 500 mg by mouth daily.    aspirin 81 MG tablet Take 81 mg by mouth daily.     Cholecalciferol (VITAMIN D) 2000 UNITS tablet Take 2,000 Units by mouth 2 (two) times daily.   COCONUT OIL PO Take by mouth.   cyanocobalamin 100 MCG tablet Take 100 mcg by mouth daily.   FLAXSEED, LINSEED, PO Take by mouth.   fluticasone (FLONASE) 50 MCG/ACT nasal spray Place 2 sprays into both nostrils daily.   GARLIC PO Take 6,237 mg by mouth daily.   Glucosamine-Chondroitin (OSTEO BI-FLEX REGULAR STRENGTH PO) Take by mouth.   levothyroxine (SYNTHROID, LEVOTHROID) 50 MCG tablet Take 1 tablet (50 mcg total) by mouth daily.   Multiple Vitamins-Minerals (CENTRUM SILVER PO) Take by mouth.     rosuvastatin (CRESTOR) 10 MG tablet Take 1 tablet (10 mg total) by mouth daily.   TURMERIC PO Take by mouth.   vitamin E (VITAMIN E) 400 UNIT capsule Take 400 Units by mouth daily.      New complaints: None today  Social history: Lives with husband and son.  Review of Systems  Constitutional: Negative for diaphoresis and weight loss.  Eyes: Negative for blurred vision, double vision and pain.  Respiratory: Negative  for shortness of breath.   Cardiovascular: Negative for chest pain, palpitations, orthopnea and leg swelling.  Gastrointestinal: Negative for abdominal pain.  Skin: Negative for rash.  Neurological: Negative for dizziness, sensory change, loss of consciousness, weakness and headaches.  Endo/Heme/Allergies: Negative for polydipsia. Does not bruise/bleed easily.  Psychiatric/Behavioral: Negative for memory loss. The patient does not have insomnia.   All other systems reviewed and are negative.      Observations/Objective: Alert and oriented- answers  all questions appropriately No distress indicated  Assessment and Plan: Julia Poole calls in today with chief complaint of Medical Management of Chronic Issues   Diagnosis and orders addressed:  1. Mixed hyperlipidemia Low fat diet - rosuvastatin (CRESTOR) 10 MG tablet; Take 1 tablet (10 mg total) by mouth daily.  Dispense: 90 tablet; Refill: 1  2. Hypothyroidism due to acquired atrophy of thyroid - levothyroxine (SYNTHROID, LEVOTHROID) 50 MCG tablet; Take 1 tablet (50 mcg total) by mouth daily.  Dispense: 90 tablet; Refill: 1  3. Atypical meningioma of brain ([place]) Watch for signs of ICP  4. BMI 31.0-31.9,adult Discussed diet and exercise for person with BMI >25 Will recheck weight in 3-6 months  5. Chronic midline low back pain without sciatica Back stretches. - acetaminophen-codeine (TYLENOL #3) 300-30 MG tablet; Take 1 tablet by mouth every 6 (six) hours as needed for moderate pain.  Dispense: 120 tablet; Refill: 2   Previous labs reviewed Health Maintenance reviewed Diet and exercise encouraged  Follow up plan: 3 months     I discussed the assessment and treatment plan with the patient. The patient was provided an opportunity to ask questions and all were answered. The patient agreed with the plan and demonstrated an understanding of the instructions.   The patient was advised to call back or seek an in-person evaluation if the symptoms worsen or if the condition fails to improve as anticipated.  The above assessment and management plan was discussed with the patient. The patient verbalized understanding of and has agreed to the management plan. Patient is aware to call the clinic if symptoms persist or worsen. Patient is aware when to return to the clinic for a follow-up visit. Patient educated on when it is appropriate to go to the emergency department.    I provided 14 minutes of non-face-to-face time during this encounter.    [name] Done, FNP

## 2018-10-12 ENCOUNTER — Other Ambulatory Visit: Payer: Self-pay

## 2018-10-14 ENCOUNTER — Ambulatory Visit (INDEPENDENT_AMBULATORY_CARE_PROVIDER_SITE_OTHER): Payer: Medicare Other | Admitting: Nurse Practitioner

## 2018-10-14 ENCOUNTER — Other Ambulatory Visit: Payer: Self-pay

## 2018-10-14 ENCOUNTER — Encounter: Payer: Self-pay | Admitting: Nurse Practitioner

## 2018-10-14 VITALS — BP 120/70 | HR 68 | Temp 98.0°F | Ht 64.0 in | Wt 192.0 lb

## 2018-10-14 DIAGNOSIS — Z79891 Long term (current) use of opiate analgesic: Secondary | ICD-10-CM | POA: Diagnosis not present

## 2018-10-14 DIAGNOSIS — D420 Neoplasm of uncertain behavior of cerebral meninges: Secondary | ICD-10-CM

## 2018-10-14 DIAGNOSIS — M4316 Spondylolisthesis, lumbar region: Secondary | ICD-10-CM

## 2018-10-14 DIAGNOSIS — E034 Atrophy of thyroid (acquired): Secondary | ICD-10-CM | POA: Diagnosis not present

## 2018-10-14 DIAGNOSIS — Z6832 Body mass index (BMI) 32.0-32.9, adult: Secondary | ICD-10-CM

## 2018-10-14 DIAGNOSIS — E782 Mixed hyperlipidemia: Secondary | ICD-10-CM

## 2018-10-14 MED ORDER — ROSUVASTATIN CALCIUM 10 MG PO TABS: 10.0000 mg | ORAL_TABLET | Freq: Every day | ORAL | 1 refills | Status: DC

## 2018-10-14 MED ORDER — ACETAMINOPHEN-CODEINE #3 300-30 MG PO TABS: 1.0000 | ORAL_TABLET | Freq: Four times a day (QID) | ORAL | 2 refills | Status: DC | PRN

## 2018-10-14 MED ORDER — LEVOTHYROXINE SODIUM 50 MCG PO TABS: 50.0000 ug | ORAL_TABLET | Freq: Every day | ORAL | 1 refills | Status: DC

## 2018-10-14 NOTE — Progress Notes (Signed)
Subjective:    Patient ID: Julia Poole, female    DOB: Jul 22, 1955, 63 y.o.   MRN: [id number]   Chief Complaint: medical management of chronic issues   HPI:  1. Hypothyroidism due to acquired atrophy of thyroid No problems that aware of  2. Mixed hyperlipidemia Tries to watch diet. Does very little exercise.  3. Spondylolisthesis of lumbar region Has chronic back pain. Pain assessment: Cause of pain- spondylolisthesis Pain location- lower back Pain on scale of 1-10- 4-5/10 currently Frequency- daily What increases pain-laying around to much What makes pain Better-stretching Effects on ADL - none Any change in general medical condition-none  Current opioids rx- tylenol #3 # meds rx- 4x a day Effectiveness of current meds-helps Adverse reactions form pain meds-none Morphine equivalent- 18 MEDD  Pill count performed-No Last drug screen - never ( high risk q80m moderate risk q675mlow risk yearly ) U[place] drug [place] today- [place]  If yes were their any concerning findings? - no  Opioid Risk  10/14/2018  Alcohol 0  Illegal Drugs 0  Rx Drugs 0  Alcohol 0  Illegal Drugs 0  Rx Drugs 0  Age between 16-45 years  0  History of Preadolescent Sexual Abuse 0  Psychological Disease 0  Depression 0  Opioid Risk Tool Scoring 0  Opioid Risk Interpretation Low Risk     Pain contract signed on: new today   4. [facility]. Is doing well. No residual effects  5. BMI 31.0-31.9,adult Weight is up 8lbs since last visit    Outpatient Encounter Medications as of 10/14/2018  Medication Sig  . acetaminophen-codeine (TYLENOL #3) 300-30 MG tablet Take 1 tablet by mouth every 6 (six) hours as needed for moderate pain.  . Ascorbic Acid (VITAMIN C) 500 MG tablet Take 500 mg by mouth daily.   . Marland Kitchenspirin 81 MG tablet Take 81 mg by mouth daily.    . Cholecalciferol (VITAMIN D) 2000  UNITS tablet Take 2,000 Units by mouth 2 (two) times daily.  . COCONUT OIL PO Take by mouth.  . cyanocobalamin 100 MCG tablet Take 100 mcg by mouth daily.  . Marland KitchenLAXSEED, LINSEED, PO Take by mouth.  . fluticasone (FLONASE) 50 MCG/ACT nasal spray Place 2 sprays into both nostrils daily.  . Marland KitchenARLIC PO Take 1,6,195g by mouth daily.  . Glucosamine-Chondroitin (OSTEO BI-FLEX REGULAR STRENGTH PO) Take by mouth.  . levothyroxine (SYNTHROID, LEVOTHROID) 50 MCG tablet Take 1 tablet (50 mcg total) by mouth daily.  . Multiple Vitamins-Minerals (CENTRUM SILVER PO) Take by mouth.    . rosuvastatin (CRESTOR) 10 MG tablet Take 1 tablet (10 mg total) by mouth daily.  . TURMERIC PO Take by mouth.  . vitamin E (VITAMIN E) 400 UNIT capsule Take 400 Units by mouth daily.     Past Surgical History:  Procedure Laterality Date  . BRAIN SURGERY    . BREAST SURGERY  AUG. 2010   right breast lumpectomy  . BREAST SURGERY     Cancer  . COLONOSCOPY    . CRANIOTOMY Left 10/16/2015   Procedure: PARA-SAGITTAL CRANIOTOMY WITH STEREOTACTIC NAVIGATION FOR TUMOR;  Surgeon: BeKevan Nyitty, MD;  [facility];  Service: Neurosurgery;  Laterality: Left;  . GANGLION CYST EXCISION  SEP. 1997   RIGHT WRIST    Family History  Problem Relation Age of Onset  . Colon cancer Mother 5657. Diabetes  Father   . Heart disease Father   . Cancer Father        skin  . Breast cancer Brother 75  . Cancer Brother        bone mets  . Leukemia Sister 53       CLL  . Cancer Brother        skin  . Ovarian cancer Maternal Aunt        dx in her 18s  . Tuberculosis Maternal Grandmother   . Heart disease Maternal Grandfather   . Diabetes Paternal Grandmother   . Alcohol abuse Paternal Grandfather   . Esophageal cancer Neg Hx   . Rectal cancer Neg Hx   . Stomach cancer Neg Hx     New complaints: Non etoday  Social history: Lives with husband who are both retired  Controlled substance contract: 10/14/18     Review of Systems  Constitutional: Negative for activity change and appetite change.  HENT: Negative.   Eyes: Negative for pain.  Respiratory: Negative for shortness of breath.   Cardiovascular: Negative for chest pain, palpitations and leg swelling.  Gastrointestinal: Negative for abdominal pain.  Endocrine: Negative for polydipsia.  Genitourinary: Negative.   Musculoskeletal: Positive for back pain.  Skin: Negative for rash.  Neurological: Negative for dizziness, weakness and headaches.  Hematological: Does not bruise/bleed easily.  Psychiatric/Behavioral: Negative.   All other systems reviewed and are negative.      Objective:   Physical Exam Vitals signs and nursing note reviewed.  Constitutional:      General: She is not in acute distress.    Appearance: Normal appearance. She is well-developed.  HENT:     Head: Normocephalic.     Nose: Nose normal.  Eyes:     Pupils: Pupils are equal, round, and reactive to light.  Neck:     Musculoskeletal: Normal range of motion and neck supple.     Vascular: No carotid bruit or JVD.  Cardiovascular:     Rate and Rhythm: Normal rate and regular rhythm.     Heart sounds: Normal heart sounds.  Pulmonary:     Effort: Pulmonary effort is normal. No respiratory distress.     Breath sounds: Normal breath sounds. No wheezing or rales.  Chest:     Chest Klimaszewski: No tenderness.  Abdominal:     General: Bowel sounds are normal. There is no distension or abdominal bruit.     Palpations: Abdomen is soft. There is no hepatomegaly, splenomegaly, mass or pulsatile mass.     Tenderness: There is no abdominal tenderness.  Musculoskeletal: Normal range of motion.     Comments: Rises slowly from laying to sitting  Lymphadenopathy:     Cervical: No cervical adenopathy.  Skin:    General: Skin is warm and dry.  Neurological:     Mental Status: She is alert and oriented to person, place, and time.     Deep Tendon Reflexes: Reflexes are normal and  symmetric.  Psychiatric:        Behavior: Behavior normal.        Thought Content: Thought content normal.        Judgment: Judgment normal.       BP 120/70   Pulse 68   Temp 98 F (36.7 C) (Oral)   Ht 5' 4" (1.626 m)   Wt 192 lb (87.1 kg)   BMI 32.96 kg/m      Assessment & Plan:  Sung Renton Naff comes in today with  chief complaint of Medical Management of Chronic Issues   Diagnosis and orders addressed:  1. Hypothyroidism due to acquired atrophy of thyroid - levothyroxine (SYNTHROID) 50 MCG tablet; Take 1 tablet (50 mcg total) by mouth daily.  Dispense: 90 tablet; Refill: 1 - Thyroid Panel With TSH  2. Mixed hyperlipidemia Low fat - rosuvastatin (CRESTOR) 10 MG tablet; Take 1 tablet (10 mg total) by mouth daily.  Dispense: 90 tablet; Refill: 1 - CMP14+EGFR - Lipid panel  3. Spondylolisthesis of lumbar region - acetaminophen-codeine (TYLENOL #3) 300-30 MG tablet; Take 1 tablet by mouth every 6 (six) hours as needed for moderate pain.  Dispense: 120 tablet; Refill: 2  4. Atypical meningioma of brain [facility]) If develop headache- let us know  5. Adult BMI 32.0-32.9 kg/sq m Discussed diet and exercise for person with BMI >25 Will recheck weight in 3-6 months   Labs pending Health Maintenance reviewed Diet and exercise encouraged  Follow up plan: 6 months   [name] Done, FNP

## 2018-10-14 NOTE — Patient Instructions (Signed)
Exercising to Stay Healthy To become healthy and stay healthy, it is recommended that you do moderate-intensity and vigorous-intensity exercise. You can tell that you are exercising at a moderate intensity if your heart starts beating faster and you start breathing faster but can still hold a conversation. You can tell that you are exercising at a vigorous intensity if you are breathing much harder and faster and cannot hold a conversation while exercising. Exercising regularly is important. It has many health benefits, such as:  Improving overall fitness, flexibility, and endurance.  Increasing bone density.  Helping with weight control.  Decreasing body fat.  Increasing muscle strength.  Reducing stress and tension.  Improving overall health. How often should I exercise? Choose an activity that you enjoy, and set realistic goals. Your health care provider can help you make an activity plan that works for you. Exercise regularly as told by your health care provider. This may include:  Doing strength training two times a week, such as: ? Lifting weights. ? Using resistance bands. ? Push-ups. ? Sit-ups. ? Yoga.  Doing a certain intensity of exercise for a given amount of time. Choose from these options: ? A total of 150 minutes of moderate-intensity exercise every week. ? A total of 75 minutes of vigorous-intensity exercise every week. ? A mix of moderate-intensity and vigorous-intensity exercise every week. Children, pregnant women, people who have not exercised regularly, people who are overweight, and older adults may need to talk with a health care provider about what activities are safe to do. If you have a medical condition, be sure to talk with your health care provider before you start a new exercise program. What are some exercise ideas? Moderate-intensity exercise ideas include:  Walking 1 mile (1.6 km) in about 15 minutes.  Biking.  Hiking.  Golfing.  Dancing.   Water aerobics. Vigorous-intensity exercise ideas include:  Walking 4.5 miles (7.2 km) or more in about 1 hour.  Jogging or running 5 miles (8 km) in about 1 hour.  Biking 10 miles (16.1 km) or more in about 1 hour.  Lap swimming.  Roller-skating or in-line skating.  Cross-country skiing.  Vigorous competitive sports, such as football, basketball, and soccer.  Jumping rope.  Aerobic dancing. What are some everyday activities that can help me to get exercise?  Yard work, such as: ? Pushing a lawn mower. ? Raking and bagging leaves.  Washing your car.  Pushing a stroller.  Shoveling snow.  Gardening.  Washing windows or floors. How can I be more active in my day-to-day activities?  Use stairs instead of an elevator.  Take a walk during your lunch break.  If you drive, park your car farther away from your work or school.  If you take public transportation, get off one stop early and walk the rest of the way.  Stand up or walk around during all of your indoor phone calls.  Get up, stretch, and walk around every 30 minutes throughout the day.  Enjoy exercise with a friend. Support to continue exercising will help you keep a regular routine of activity. What guidelines can I follow while exercising?  Before you start a new exercise program, talk with your health care provider.  Do not exercise so much that you hurt yourself, feel dizzy, or get very short of breath.  Wear comfortable clothes and wear shoes with good support.  Drink plenty of water while you exercise to prevent dehydration or heat stroke.  Work out until your breathing   and your heartbeat get faster. Where to find more information  [place] Department of Health and Human Services: www.hhs.gov  Centers for Disease Control and Prevention (CDC): www.cdc.gov Summary  Exercising regularly is important. It will improve your overall fitness, flexibility, and endurance.  Regular exercise also will  improve your overall health. It can help you control your weight, reduce stress, and improve your bone density.  Do not exercise so much that you hurt yourself, feel dizzy, or get very short of breath.  Before you start a new exercise program, talk with your health care provider. This information is not intended to replace advice given to you by your health care provider. Make sure you discuss any questions you have with your health care provider. Document Released: 04/20/2010 Document Revised: 02/28/2017 Document Reviewed: 02/06/2017 Elsevier Patient Education  2020 [employer]  

## 2018-10-14 NOTE — Addendum Note (Signed)
Addended by: Chevis Pretty on: 10/14/2018 10:45 AM   Modules accepted: Orders

## 2018-10-15 LAB — CMP14+EGFR
ALT: 21 IU/L (ref 0–32)
AST: 21 IU/L (ref 0–40)
Albumin/Globulin Ratio: 2.1 (ref 1.2–2.2)
Albumin: 4.4 g/dL (ref 3.8–4.8)
Alkaline Phosphatase: 80 IU/L (ref 39–117)
BUN/Creatinine Ratio: 20 (ref 12–28)
BUN: 15 mg/dL (ref 8–27)
Bilirubin Total: 1.1 mg/dL (ref 0.0–1.2)
CO2: 24 mmol/L (ref 20–29)
Calcium: 9.8 mg/dL (ref 8.7–10.3)
Chloride: 104 mmol/L (ref 96–106)
Creatinine, Ser: 0.74 mg/dL (ref 0.57–1.00)
GFR calc Af Amer: 99 mL/min/{1.73_m2} (ref >59)
GFR calc non Af Amer: 86 mL/min/{1.73_m2} (ref >59)
Globulin, Total: 2.1 g/dL (ref 1.5–4.5)
Glucose: 102 mg/dL — ABNORMAL HIGH (ref 65–99)
Potassium: 4.5 mmol/L (ref 3.5–5.2)
Sodium: 142 mmol/L (ref 134–144)
Total Protein: 6.5 g/dL (ref 6.0–8.5)

## 2018-10-15 LAB — LIPID PANEL
Chol/HDL Ratio: 2.8 ratio (ref 0.0–4.4)
Cholesterol, Total: 142 mg/dL (ref 100–199)
HDL: 50 mg/dL (ref >39)
LDL Calculated: 73 mg/dL (ref 0–99)
Triglycerides: 96 mg/dL (ref 0–149)
VLDL Cholesterol Cal: 19 mg/dL (ref 5–40)

## 2018-10-15 LAB — THYROID PANEL WITH TSH
Free Thyroxine Index: 2 (ref 1.2–4.9)
T3 Uptake Ratio: 25 % (ref 24–39)
T4, Total: 7.8 ug/dL (ref 4.5–12.0)
TSH: 1.74 u[IU]/mL (ref 0.450–4.500)

## 2018-10-18 LAB — TOXASSURE SELECT 13 (MW), URINE

## 2018-12-16 LAB — HM MAMMOGRAPHY

## 2019-01-20 ENCOUNTER — Other Ambulatory Visit: Payer: Self-pay

## 2019-01-20 ENCOUNTER — Ambulatory Visit (INDEPENDENT_AMBULATORY_CARE_PROVIDER_SITE_OTHER): Payer: Medicare Other | Admitting: *Deleted

## 2019-01-20 DIAGNOSIS — Z0000 Encounter for general adult medical examination without abnormal findings: Secondary | ICD-10-CM

## 2019-01-20 NOTE — Progress Notes (Signed)
MEDICARE ANNUAL WELLNESS VISIT  01/20/2019  Telephone Visit Disclaimer This Medicare AWV was conducted by telephone due to national recommendations for restrictions regarding the COVID-19 Pandemic (e.g. social distancing).  I verified, using two identifiers, that I am speaking with Julia Poole or their authorized healthcare agent. I discussed the limitations, risks, security, and privacy concerns of performing an evaluation and management service by telephone and the potential availability of an in-person appointment in the future. The patient expressed understanding and agreed to proceed.   Subjective:  Julia Poole is a 63 y.o. female patient of Chevis Pretty, FNP who had a Medicare Annual Wellness Visit today via telephone. Julia Poole is Retired and lives with their spouse. she has 1 child. she reports that she is socially active and does interact with friends/family regularly. she is moderately physically active and enjoys music and flower arranging.  Patient Care Team: Chevis Pretty, FNP as PCP - General (Nurse Practitioner) Merilyn Baba, MD as Referring Physician (Internal Medicine) Consuella Lose, MD as Consulting Physician (Neurosurgery)  Advanced Directives 01/20/2019 09/25/2017 08/28/2016 08/09/2016 07/26/2016 11/15/2015 10/18/2015  Does Patient Have a Medical Advance Directive? No No No No No No No  Would patient like information on creating a medical advance directive? No - Patient declined No - Patient declined Yes (MAU/Ambulatory/Procedural Areas - Information given) - - Yes - Educational materials given -    Hospital Utilization Over the Past 12 Months: # of hospitalizations or ER visits: 0 # of surgeries: 0  Review of Systems    Patient reports that her overall health is unchanged compared to last year.  History obtained from chart review and the patient  Patient Reported Readings (BP, Pulse, CBG, Weight, etc) none  Pain Assessment Pain :  0-10 Pain Score: 1  Pain Type: Chronic pain Pain Location: Back Pain Orientation: Lower Pain Descriptors / Indicators: Sharp Pain Onset: Other (comment) Pain Frequency: Intermittent Pain Relieving Factors: pain med Effect of Pain on Daily Activities: just takes it slow  Pain Relieving Factors: pain med  Current Medications & Allergies (verified) Allergies as of 01/20/2019      Reactions   Norco [hydrocodone-acetaminophen] Nausea Only      Medication List       Accurate as of January 20, 2019  2:06 PM. If you have any questions, ask your nurse or doctor.        acetaminophen-codeine 300-30 MG tablet Commonly known as: TYLENOL #3 Take 1 tablet by mouth every 6 (six) hours as needed for moderate pain.   aspirin 81 MG tablet Take 81 mg by mouth daily.   CENTRUM SILVER PO Take by mouth.   COCONUT OIL PO Take by mouth.   cyanocobalamin 100 MCG tablet Take 100 mcg by mouth daily.   FLAXSEED (LINSEED) PO Take by mouth.   fluticasone 50 MCG/ACT nasal spray Commonly known as: FLONASE Place 2 sprays into both nostrils daily.   levothyroxine 50 MCG tablet Commonly known as: SYNTHROID Take 1 tablet (50 mcg total) by mouth daily.   OSTEO BI-FLEX REGULAR STRENGTH PO Take by mouth.   rosuvastatin 10 MG tablet Commonly known as: CRESTOR Take 1 tablet (10 mg total) by mouth daily.   TURMERIC PO Take by mouth.   vitamin C 500 MG tablet Commonly known as: ASCORBIC ACID Take 500 mg by mouth daily.   Vitamin D 50 MCG (2000 UT) tablet Take 2,000 Units by mouth 2 (two) times daily.   vitamin E 400 UNIT capsule  Generic drug: vitamin E Take 400 Units by mouth daily.       History (reviewed): Past Medical History:  Diagnosis Date  . Allergy   . Arthritis   . Bleeding nose   . Cancer [facility]) 2010   right breast  . Cancer ([place]) 09/2015   brain  . Cataract   . Degenerative disk disease 2012  . Family history of breast cancer in female   . Family history of  colon cancer   . Family history of ovarian cancer   . Hemorrhoids   . Hyperlipidemia   . Sinus problem   . Thyroid disease    Past Surgical History:  Procedure Laterality Date  . BRAIN SURGERY    . BREAST SURGERY  AUG. 2010   right breast lumpectomy  . BREAST SURGERY     Cancer  . COLONOSCOPY    . CRANIOTOMY Left 10/16/2015   Procedure: PARA-SAGITTAL CRANIOTOMY WITH STEREOTACTIC NAVIGATION FOR TUMOR;  Surgeon: Kevan Ny Ditty, MD;  Location: [place] [facility];  Service: Neurosurgery;  Laterality: Left;  . GANGLION CYST EXCISION  SEP. 1997   RIGHT WRIST   Family History  Problem Relation Age of Onset  . Colon cancer Mother 102  . Diabetes Father   . Heart disease Father   . Cancer Father        skin  . Breast cancer Brother 62  . Cancer Brother        bone mets  . Leukemia Sister 100       CLL  . Cancer Brother        skin  . Ovarian cancer Maternal Aunt        dx in her 89s  . Tuberculosis Maternal Grandmother   . Heart disease Maternal Grandfather   . Diabetes Paternal Grandmother   . Alcohol abuse Paternal Grandfather   . Esophageal cancer Neg Hx   . Rectal cancer Neg Hx   . Stomach cancer Neg Hx    Social History   Socioeconomic History  . Marital status: Married    Spouse name: Not on file  . Number of children: 1  . Years of education: 67  . Highest education level: 12th grade  Occupational History  . Occupation: Librarian, academic. [employer]  Social Needs  . Financial resource strain: Not hard at all  . Food insecurity    Worry: Never true    Inability: Never true  . Transportation needs    Medical: No    Non-medical: No  Tobacco Use  . Smoking status: Never Smoker  . Smokeless tobacco: Never Used  Substance and Sexual Activity  . Alcohol use: No  . Drug use: No  . Sexual activity: Yes    Birth control/protection: Post-menopausal  Lifestyle  . Physical activity    Days per week: 5 days    Minutes per session: 30 min  . Stress: Not at all  Relationships   . Social connections    Talks on phone: More than three times a week    Gets together: More than three times a week    Attends religious service: More than 4 times per year    Active member of club or organization: No    Attends meetings of clubs or organizations: Never    Relationship status: Married  Other Topics Concern  . Not on file  Social History Narrative  . Not on file    Activities of Daily Living In your present state of health, do you  have any difficulty performing the following activities: 01/20/2019  Hearing? N  Vision? N  Difficulty concentrating or making decisions? N  Walking or climbing stairs? N  Dressing or bathing? N  Doing errands, shopping? N  Preparing Food and eating ? Y  Using the Toilet? N  In the past six months, have you accidently leaked urine? N  Do you have problems with loss of bowel control? N  Managing your Medications? N  Managing your Finances? N  Housekeeping or managing your Housekeeping? N  Some recent data might be hidden    Patient Education/ Literacy How often do you need to have someone help you when you read instructions, pamphlets, or other written materials from your doctor or pharmacy?: 1 - Never What is the last grade level you completed in school?: 12  Exercise Current Exercise Habits: Home exercise routine, Type of exercise: walking, Time (Minutes): > 60, Frequency (Times/Week): 5, Weekly Exercise (Minutes/Week): 0, Intensity: Moderate  Diet Patient reports consuming 2 meals a day and 1 snack(s) a day Patient reports that her primary diet is: Regular Patient reports that she does have regular access to food.   Depression Screen PHQ 2/9 Scores 01/20/2019 10/14/2018 01/06/2018 09/25/2017 06/05/2017 03/21/2017 12/30/2016  PHQ - 2 Score 0 0 0 0 0 0 0     Fall Risk Fall Risk  01/20/2019 10/14/2018 01/06/2018 09/25/2017 06/05/2017  Falls in the past year? 0 0 No No No     Objective:  Julia Poole seemed alert and oriented and  she participated appropriately during our telephone visit.  Blood Pressure Weight BMI  BP Readings from Last 3 Encounters:  10/14/18 120/70  04/18/18 112/62  01/06/18 111/71   Wt Readings from Last 3 Encounters:  10/14/18 192 lb (87.1 kg)  04/18/18 193 lb 6.4 oz (87.7 kg)  01/06/18 196 lb (88.9 kg)   BMI Readings from Last 1 Encounters:  10/14/18 32.96 kg/m    *Unable to obtain current vital signs, weight, and BMI due to telephone visit type  Hearing/Vision  . Julia Poole did not seem to have difficulty with hearing/understanding during the telephone conversation . Reports that she has had a formal eye exam by an eye care professional within the past year . Reports that she has not had a formal hearing evaluation within the past year *Unable to fully assess hearing and vision during telephone visit type  Cognitive Function: 6CIT Screen 01/20/2019  What Year? 0 points  What month? 0 points  What time? 0 points  Count back from 20 0 points  Months in reverse 0 points  Repeat phrase 0 points  Total Score 0   (Normal:0-7, Significant for Dysfunction: >8)  Normal Cognitive Function Screening: Yes   Immunization & Health Maintenance Record Immunization History  Administered Date(s) Administered  . Influenza,inj,Quad PF,6+ Mos 01/01/2013, 01/19/2014, 02/09/2015, 02/26/2016, 01/06/2018  . Influenza-Unspecified 12/30/2013  . Zoster 05/19/2015    Health Maintenance  Topic Date Due  . HIV Screening  10/31/1968  . TETANUS/TDAP  10/31/1972  . DEXA SCAN  09/25/2018  . INFLUENZA VACCINE  10/31/2018  . PNA vac Low Risk Adult (1 of 2 - PCV13) 11/01/2018  . PAP SMEAR-Modifier  07/31/2020  . MAMMOGRAM  12/15/2020  . COLONOSCOPY  08/09/2021  . Hepatitis C Screening  Completed       Assessment  This is a routine wellness examination for Julia Poole.  Health Maintenance: Due or Overdue Health Maintenance Due  Topic Date Due  . HIV Screening  10/31/1968  . TETANUS/TDAP   10/31/1972  . DEXA SCAN  09/25/2018  . INFLUENZA VACCINE  10/31/2018  . PNA vac Low Risk Adult (1 of 2 - PCV13) 11/01/2018    Julia Poole does not need a referral for Community Assistance: Care Management:   no Social Work:    no Prescription Assistance:  no Nutrition/Diabetes Education:  no   Plan:  Personalized Goals Goals Addressed            This Visit's Progress   . <enter goal here>   On track    Stay Healthy Stay Cancer Free    . Exercise 150 min/wk Moderate Activity   On track   . Have 3 meals a day   Worsening    Eat More at home Increase fruits and vegetables  Increase lean proteins Goal Weight 170 lbs     . Weight (lb) < 200 lb (90.7 kg)       Set Goal of loseing 20 pds this year      Personalized Health Maintenance & Screening Recommendations  Pneumococcal vaccine  Influenza vaccine Bone densitometry screening  Lung Cancer Screening Recommended: no (Low Dose CT Chest recommended if Age 51-80 years, 30 pack-year currently smoking OR have quit w/in past 15 years) Hepatitis C Screening recommended: no HIV Screening recommended: yes  Advanced Directives: Written information was not prepared per patient's request.  Referrals & Orders No orders of the defined types were placed in this encounter.   Follow-up Plan . Follow-up with Chevis Pretty, FNP as planned  Pt will get flu and prevnar vaccines on 01/27/19. She declines tdap. We will discuss Dexa at next office visit (04/16/19). Patient is very active and overall healthy. Hearing and vision are fine. Pt would like to lose 20 pounds this upcoming year.  .     I have personally reviewed and noted the following in the patient's chart:   . Medical and social history . Use of alcohol, tobacco or illicit drugs  . Current medications and supplements . Functional ability and status . Nutritional status . Physical activity . Advanced directives . List of other physicians .  Hospitalizations, surgeries, and ER visits in previous 12 months . Vitals . Screenings to include cognitive, depression, and falls . Referrals and appointments  In addition, I have reviewed and discussed with Julia Poole certain preventive protocols, quality metrics, and best practice recommendations. A written personalized care plan for preventive services as well as general preventive health recommendations is available and can be mailed to the patient at her request.      Rana Snare, LPN  [phone]

## 2019-01-26 ENCOUNTER — Other Ambulatory Visit: Payer: Self-pay

## 2019-01-27 ENCOUNTER — Ambulatory Visit (INDEPENDENT_AMBULATORY_CARE_PROVIDER_SITE_OTHER): Payer: Medicare Other

## 2019-01-27 DIAGNOSIS — Z23 Encounter for immunization: Secondary | ICD-10-CM

## 2019-01-27 NOTE — Progress Notes (Signed)
Prevnar 13 vaccine given to left deltoid. Patient tolerated well.

## 2019-02-15 ENCOUNTER — Other Ambulatory Visit: Payer: Self-pay | Admitting: Physician Assistant

## 2019-02-15 DIAGNOSIS — D329 Benign neoplasm of meninges, unspecified: Secondary | ICD-10-CM

## 2019-04-16 ENCOUNTER — Encounter: Payer: Self-pay | Admitting: Nurse Practitioner

## 2019-04-16 ENCOUNTER — Other Ambulatory Visit: Payer: Self-pay

## 2019-04-16 ENCOUNTER — Ambulatory Visit (INDEPENDENT_AMBULATORY_CARE_PROVIDER_SITE_OTHER): Payer: Medicare Other | Admitting: Nurse Practitioner

## 2019-04-16 ENCOUNTER — Ambulatory Visit (INDEPENDENT_AMBULATORY_CARE_PROVIDER_SITE_OTHER): Payer: Medicare Other

## 2019-04-16 VITALS — BP 127/74 | HR 79 | Temp 97.1°F | Resp 20 | Ht 64.0 in | Wt 196.0 lb

## 2019-04-16 DIAGNOSIS — M4316 Spondylolisthesis, lumbar region: Secondary | ICD-10-CM | POA: Diagnosis not present

## 2019-04-16 DIAGNOSIS — Z780 Asymptomatic menopausal state: Secondary | ICD-10-CM

## 2019-04-16 DIAGNOSIS — E782 Mixed hyperlipidemia: Secondary | ICD-10-CM | POA: Diagnosis not present

## 2019-04-16 DIAGNOSIS — Z6831 Body mass index (BMI) 31.0-31.9, adult: Secondary | ICD-10-CM

## 2019-04-16 DIAGNOSIS — E034 Atrophy of thyroid (acquired): Secondary | ICD-10-CM

## 2019-04-16 DIAGNOSIS — D420 Neoplasm of uncertain behavior of cerebral meninges: Secondary | ICD-10-CM

## 2019-04-16 MED ORDER — LEVOTHYROXINE SODIUM 50 MCG PO TABS: 50.0000 ug | ORAL_TABLET | Freq: Every day | ORAL | 1 refills | Status: DC

## 2019-04-16 MED ORDER — ROSUVASTATIN CALCIUM 10 MG PO TABS: 10.0000 mg | ORAL_TABLET | Freq: Every day | ORAL | 1 refills | Status: DC

## 2019-04-16 NOTE — Progress Notes (Signed)
Subjective:    Patient ID: Julia Poole, female    DOB: 1955/09/21, 64 y.o.   MRN: [id number]   Chief Complaint: Medical Management of Chronic Issues    HPI:  1. Mixed hyperlipidemia Does not watch diet and does not do any exercise. Lab Results  Component Value Date   CHOL 142 10/14/2018   HDL 50 10/14/2018   LDLCALC 73 10/14/2018   TRIG 96 10/14/2018   CHOLHDL 2.8 10/14/2018     2. Hypothyroidism due to acquired atrophy of thyroid No problems that she is aware of. Lab Results  Component Value Date   TSH 1.740 10/14/2018     3. Atypical meningioma of brain [facility]) Has had no reoccurence since 2017. She has had no physical limitations since her surgery.  4. Spondylolisthesis of lumbar region Has chronic back pain. She takes tylenol #3  On occasion when her pain is really bad.  5. BMI 31.0-31.9,adult Weight is up 5lbs Wt Readings from Last 3 Encounters:  04/16/19 196 lb (88.9 kg)  10/14/18 192 lb (87.1 kg)  04/18/18 193 lb 6.4 oz (87.7 kg)   BMI Readings from Last 3 Encounters:  04/16/19 33.64 kg/m  10/14/18 32.96 kg/m  04/18/18 33.20 kg/m       Outpatient Encounter Medications as of 04/16/2019  Medication Sig  . acetaminophen-codeine (TYLENOL #3) 300-30 MG tablet Take 1 tablet by mouth every 6 (six) hours as needed for moderate pain.  . Ascorbic Acid (VITAMIN C) 500 MG tablet Take 500 mg by mouth daily.   Marland Kitchen aspirin 81 MG tablet Take 81 mg by mouth daily.    . Cholecalciferol (VITAMIN D) 2000 UNITS tablet Take 2,000 Units by mouth 2 (two) times daily.  . COCONUT OIL PO Take by mouth.  . cyanocobalamin 100 MCG tablet Take 100 mcg by mouth daily.  Marland Kitchen FLAXSEED, LINSEED, PO Take by mouth.  . fluticasone (FLONASE) 50 MCG/ACT nasal spray Place 2 sprays into both nostrils daily.  . Glucosamine-Chondroitin (OSTEO BI-FLEX REGULAR STRENGTH PO) Take by mouth.  . levothyroxine (SYNTHROID) 50 MCG tablet Take 1 tablet (50 mcg total) by mouth daily.  . Multiple  Vitamins-Minerals (CENTRUM SILVER PO) Take by mouth.    . rosuvastatin (CRESTOR) 10 MG tablet Take 1 tablet (10 mg total) by mouth daily.  . TURMERIC PO Take by mouth.  . vitamin E (VITAMIN E) 400 UNIT capsule Take 400 Units by mouth daily.     Past Surgical History:  Procedure Laterality Date  . BRAIN SURGERY    . BREAST SURGERY  AUG. 2010   right breast lumpectomy  . BREAST SURGERY     Cancer  . COLONOSCOPY    . CRANIOTOMY Left 10/16/2015   Procedure: PARA-SAGITTAL CRANIOTOMY WITH STEREOTACTIC NAVIGATION FOR TUMOR;  Surgeon: Kevan Ny Ditty, MD;  Location: [place] [facility];  Service: Neurosurgery;  Laterality: Left;  . GANGLION CYST EXCISION  SEP. 1997   RIGHT WRIST    Family History  Problem Relation Age of Onset  . Colon cancer Mother 44  . Diabetes Father   . Heart disease Father   . Cancer Father        skin  . Breast cancer Brother 74  . Cancer Brother        bone mets  . Leukemia Sister 38       CLL  . Cancer Brother        skin  . Ovarian cancer Maternal Aunt  dx in her 48s  . Tuberculosis Maternal Grandmother   . Heart disease Maternal Grandfather   . Diabetes Paternal Grandmother   . Alcohol abuse Paternal Grandfather   . Esophageal cancer Neg Hx   . Rectal cancer Neg Hx   . Stomach cancer Neg Hx     New complaints: None today  Social history: Lives with husband and son  Controlled substance contract: 10/19/18    Review of Systems  Constitutional: Negative for diaphoresis.  Eyes: Negative for pain.  Respiratory: Negative for shortness of breath.   Cardiovascular: Negative for chest pain, palpitations and leg swelling.  Gastrointestinal: Negative for abdominal pain.  Endocrine: Negative for polydipsia.  Skin: Negative for rash.  Neurological: Negative for dizziness, weakness and headaches.  Hematological: Does not bruise/bleed easily.  All other systems reviewed and are negative.      Objective:   Physical Exam Vitals and nursing  note reviewed.  Constitutional:      General: She is not in acute distress.    Appearance: Normal appearance. She is well-developed.  HENT:     Head: Normocephalic.     Nose: Nose normal.  Eyes:     Pupils: Pupils are equal, round, and reactive to light.  Neck:     Vascular: No carotid bruit or JVD.  Cardiovascular:     Rate and Rhythm: Normal rate and regular rhythm.     Heart sounds: Normal heart sounds.  Pulmonary:     Effort: Pulmonary effort is normal. No respiratory distress.     Breath sounds: Normal breath sounds. No wheezing or rales.  Chest:     Chest Adel: No tenderness.  Abdominal:     General: Bowel sounds are normal. There is no distension or abdominal bruit.     Palpations: Abdomen is soft. There is no hepatomegaly, splenomegaly, mass or pulsatile mass.     Tenderness: There is no abdominal tenderness.  Musculoskeletal:        General: Normal range of motion.     Cervical back: Normal range of motion and neck supple.  Lymphadenopathy:     Cervical: No cervical adenopathy.  Skin:    General: Skin is warm and dry.  Neurological:     Mental Status: She is alert and oriented to person, place, and time.     Deep Tendon Reflexes: Reflexes are normal and symmetric.  Psychiatric:        Behavior: Behavior normal.        Thought Content: Thought content normal.        Judgment: Judgment normal.    BP 127/74   Pulse 79   Temp (!) 97.1 F (36.2 C) (Temporal)   Resp 20   Ht '5\' 4"'  (1.626 m)   Wt 196 lb (88.9 kg)   SpO2 100%   BMI 33.64 kg/m        Assessment & Plan:  Julia Poole comes in today with chief complaint of Medical Management of Chronic Issues   Diagnosis and orders addressed:  1. Mixed hyperlipidemia Low fat diet and exercise - rosuvastatin (CRESTOR) 10 MG tablet; Take 1 tablet (10 mg total) by mouth daily.  Dispense: 90 tablet; Refill: 1 - CMP14+EGFR - Lipid panel  2. Hypothyroidism due to acquired atrophy of thyroid - levothyroxine  (SYNTHROID) 50 MCG tablet; Take 1 tablet (50 mcg total) by mouth daily.  Dispense: 90 tablet; Refill: 1 - Thyroid Panel With TSH  3. Atypical meningioma of brain ([place]) Keep follow up  4. Spondylolisthesis of lumbar region Take tylenol #3 as needed  5. BMI 31.0-31.9,adult Discussed diet and exercise for person with BMI >25 Will recheck weight in 3-6 months   Labs pending Health Maintenance reviewed Diet and exercise encouraged  Follow up plan: 6 months   [name] Done, FNP

## 2019-04-16 NOTE — Patient Instructions (Signed)

## 2019-04-17 LAB — LIPID PANEL
Chol/HDL Ratio: 2.8 ratio (ref 0.0–4.4)
Cholesterol, Total: 146 mg/dL (ref 100–199)
HDL: 53 mg/dL (ref >39)
LDL Chol Calc (NIH): 77 mg/dL (ref 0–99)
Triglycerides: 84 mg/dL (ref 0–149)
VLDL Cholesterol Cal: 16 mg/dL (ref 5–40)

## 2019-04-17 LAB — THYROID PANEL WITH TSH
Free Thyroxine Index: 2.1 (ref 1.2–4.9)
T3 Uptake Ratio: 25 % (ref 24–39)
T4, Total: 8.4 ug/dL (ref 4.5–12.0)
TSH: 1.87 u[IU]/mL (ref 0.450–4.500)

## 2019-04-17 LAB — CMP14+EGFR
ALT: 28 IU/L (ref 0–32)
AST: 27 IU/L (ref 0–40)
Albumin/Globulin Ratio: 2.2 (ref 1.2–2.2)
Albumin: 4.6 g/dL (ref 3.8–4.8)
Alkaline Phosphatase: 89 IU/L (ref 39–117)
BUN/Creatinine Ratio: 18 (ref 12–28)
BUN: 13 mg/dL (ref 8–27)
Bilirubin Total: 1.2 mg/dL (ref 0.0–1.2)
CO2: 21 mmol/L (ref 20–29)
Calcium: 9.7 mg/dL (ref 8.7–10.3)
Chloride: 103 mmol/L (ref 96–106)
Creatinine, Ser: 0.71 mg/dL (ref 0.57–1.00)
GFR calc Af Amer: 103 mL/min/{1.73_m2} (ref >59)
GFR calc non Af Amer: 90 mL/min/{1.73_m2} (ref >59)
Globulin, Total: 2.1 g/dL (ref 1.5–4.5)
Glucose: 101 mg/dL — ABNORMAL HIGH (ref 65–99)
Potassium: 4.2 mmol/L (ref 3.5–5.2)
Sodium: 141 mmol/L (ref 134–144)
Total Protein: 6.7 g/dL (ref 6.0–8.5)

## 2019-05-03 ENCOUNTER — Other Ambulatory Visit: Payer: Self-pay | Admitting: Radiation Therapy

## 2019-05-24 ENCOUNTER — Ambulatory Visit
Admission: RE | Admit: 2019-05-24 | Discharge: 2019-05-24 | Disposition: A | Discharge status: Home / Self Care | Payer: Medicare Other | Source: Ambulatory Visit | Attending: Physician Assistant | Admitting: Physician Assistant

## 2019-05-24 ENCOUNTER — Other Ambulatory Visit: Payer: Self-pay

## 2019-05-24 DIAGNOSIS — D329 Benign neoplasm of meninges, unspecified: Secondary | ICD-10-CM

## 2019-05-24 MED ORDER — GADOBENATE DIMEGLUMINE 529 MG/ML IV SOLN
18.0000 mL | Freq: Once | INTRAVENOUS | Status: AC | PRN
  Administered 2019-05-24: 14:00:00 18 mL via INTRAVENOUS

## 2019-05-31 DIAGNOSIS — D329 Benign neoplasm of meninges, unspecified: Secondary | ICD-10-CM | POA: Diagnosis not present

## 2019-07-29 ENCOUNTER — Other Ambulatory Visit: Payer: Self-pay | Admitting: Nurse Practitioner

## 2019-07-29 DIAGNOSIS — M4316 Spondylolisthesis, lumbar region: Secondary | ICD-10-CM

## 2019-07-30 ENCOUNTER — Ambulatory Visit (INDEPENDENT_AMBULATORY_CARE_PROVIDER_SITE_OTHER): Payer: Medicare Other | Admitting: Nurse Practitioner

## 2019-07-30 ENCOUNTER — Other Ambulatory Visit: Payer: Self-pay

## 2019-07-30 ENCOUNTER — Encounter: Payer: Self-pay | Admitting: Nurse Practitioner

## 2019-07-30 ENCOUNTER — Telehealth: Payer: Self-pay | Admitting: Nurse Practitioner

## 2019-07-30 VITALS — BP 119/71 | HR 78 | Temp 98.4°F | Ht 64.0 in | Wt 195.5 lb

## 2019-07-30 DIAGNOSIS — E034 Atrophy of thyroid (acquired): Secondary | ICD-10-CM | POA: Diagnosis not present

## 2019-07-30 DIAGNOSIS — E782 Mixed hyperlipidemia: Secondary | ICD-10-CM | POA: Diagnosis not present

## 2019-07-30 DIAGNOSIS — D420 Neoplasm of uncertain behavior of cerebral meninges: Secondary | ICD-10-CM

## 2019-07-30 DIAGNOSIS — M4316 Spondylolisthesis, lumbar region: Secondary | ICD-10-CM

## 2019-07-30 DIAGNOSIS — Z6831 Body mass index (BMI) 31.0-31.9, adult: Secondary | ICD-10-CM | POA: Diagnosis not present

## 2019-07-30 MED ORDER — ROSUVASTATIN CALCIUM 10 MG PO TABS: 10.0000 mg | ORAL_TABLET | Freq: Every day | ORAL | 1 refills | Status: DC

## 2019-07-30 MED ORDER — LEVOTHYROXINE SODIUM 50 MCG PO TABS: 50.0000 ug | ORAL_TABLET | Freq: Every day | ORAL | 1 refills | Status: DC

## 2019-07-30 MED ORDER — ACETAMINOPHEN-CODEINE #3 300-30 MG PO TABS: 1.0000 | ORAL_TABLET | Freq: Four times a day (QID) | ORAL | 2 refills | Status: DC | PRN

## 2019-07-30 NOTE — Telephone Encounter (Signed)
  Prescription Request  07/30/2019  What is the name of the medication or equipment? acetaminophen-codeine (TYLENOL #3) 300-30 MG tablet   Have you contacted your pharmacy to request a refill? (if applicable) yes  Which pharmacy would you like this sent to? [facility] has appt in July with [facility]    Patient notified that their request is being sent to the clinical staff for review and that they should receive a response within 2 business days.

## 2019-07-30 NOTE — Patient Instructions (Signed)

## 2019-07-30 NOTE — Progress Notes (Signed)
Subjective:    Patient ID: Julia Poole, female    DOB: 1955/11/24, 64 y.o.   MRN: [id number]   Chief Complaint: Medical Management of Chronic Issues (pt here today for refill on Tylenol #3)    HPI:  1. Mixed hyperlipidemia Not really watching diet and walks evryday. Lab Results  Component Value Date   CHOL 146 04/16/2019   HDL 53 04/16/2019   LDLCALC 77 04/16/2019   TRIG 84 04/16/2019   CHOLHDL 2.8 04/16/2019     2. Hypothyroidism due to acquired atrophy of thyroid Is having no problems that she is aware  3. Spondylolisthesis of lumbar region Is on tylenol #3 4x a day. Is doing well and does not want to change anything  4. BMI 31.0-31.9,adult No recent eight changes Wt Readings from Last 3 Encounters:  07/30/19 195 lb 8 oz (88.7 kg)  04/16/19 196 lb (88.9 kg)  10/14/18 192 lb (87.1 kg)   BMI Readings from Last 3 Encounters:  07/30/19 33.56 kg/m  04/16/19 33.64 kg/m  10/14/18 32.96 kg/m     5. Atypical meningioma of brain [facility]) Had to have brain surgery in 2017. She has not had any more issues.    Outpatient Encounter Medications as of 07/30/2019  Medication Sig  . acetaminophen-codeine (TYLENOL #3) 300-30 MG tablet Take 1 tablet by mouth every 6 (six) hours as needed for moderate pain.  . Ascorbic Acid (VITAMIN C) 500 MG tablet Take 500 mg by mouth daily.   Marland Kitchen aspirin 81 MG tablet Take 81 mg by mouth daily.    . Cholecalciferol (VITAMIN D) 2000 UNITS tablet Take 2,000 Units by mouth 2 (two) times daily.  . COCONUT OIL PO Take by mouth.  . cyanocobalamin 100 MCG tablet Take 100 mcg by mouth daily.  Marland Kitchen FLAXSEED, LINSEED, PO Take by mouth.  . fluticasone (FLONASE) 50 MCG/ACT nasal spray Place 2 sprays into both nostrils daily.  . Glucosamine-Chondroitin (OSTEO BI-FLEX REGULAR STRENGTH PO) Take by mouth.  . levothyroxine (SYNTHROID) 50 MCG tablet Take 1 tablet (50 mcg total) by mouth daily.  . Multiple Vitamins-Minerals (CENTRUM SILVER PO) Take by mouth.      . rosuvastatin (CRESTOR) 10 MG tablet Take 1 tablet (10 mg total) by mouth daily.  . TURMERIC PO Take by mouth.  . vitamin E (VITAMIN E) 400 UNIT capsule Take 400 Units by mouth daily.     Past Surgical History:  Procedure Laterality Date  . BRAIN SURGERY    . BREAST SURGERY  AUG. 2010   right breast lumpectomy  . BREAST SURGERY     Cancer  . COLONOSCOPY    . CRANIOTOMY Left 10/16/2015   Procedure: PARA-SAGITTAL CRANIOTOMY WITH STEREOTACTIC NAVIGATION FOR TUMOR;  Surgeon: Kevan Ny Ditty, MD;  Location: [place] [facility];  Service: Neurosurgery;  Laterality: Left;  . GANGLION CYST EXCISION  SEP. 1997   RIGHT WRIST    Family History  Problem Relation Age of Onset  . Colon cancer Mother 56  . Diabetes Father   . Heart disease Father   . Cancer Father        skin  . Breast cancer Brother 60  . Cancer Brother        bone mets  . Leukemia Sister 20       CLL  . Cancer Brother        skin  . Ovarian cancer Maternal Aunt        dx in her 55s  . Tuberculosis Maternal  Grandmother   . Heart disease Maternal Grandfather   . Diabetes Paternal Grandmother   . Alcohol abuse Paternal Grandfather   . Esophageal cancer Neg Hx   . Rectal cancer Neg Hx   . Stomach cancer Neg Hx     New complaints: None today  Social history: Lives with her husband  Controlled substance contract: 10/19/18    Review of Systems  Constitutional: Negative for diaphoresis.  Eyes: Negative for pain.  Respiratory: Negative for shortness of breath.   Cardiovascular: Negative for chest pain, palpitations and leg swelling.  Gastrointestinal: Negative for abdominal pain.  Endocrine: Negative for polydipsia.  Skin: Negative for rash.  Neurological: Negative for dizziness, weakness and headaches.  Hematological: Does not bruise/bleed easily.  All other systems reviewed and are negative.      Objective:   Physical Exam Vitals and nursing note reviewed.  Constitutional:      General: She is not  in acute distress.    Appearance: Normal appearance. She is well-developed.  HENT:     Head: Normocephalic.     Nose: Nose normal.  Eyes:     Pupils: Pupils are equal, round, and reactive to light.  Neck:     Vascular: No carotid bruit or JVD.  Cardiovascular:     Rate and Rhythm: Normal rate and regular rhythm.     Heart sounds: Normal heart sounds.  Pulmonary:     Effort: Pulmonary effort is normal. No respiratory distress.     Breath sounds: Normal breath sounds. No wheezing or rales.  Chest:     Chest Traxler: No tenderness.  Abdominal:     General: Bowel sounds are normal. There is no distension or abdominal bruit.     Palpations: Abdomen is soft. There is no hepatomegaly, splenomegaly, mass or pulsatile mass.     Tenderness: There is no abdominal tenderness.  Musculoskeletal:        General: Normal range of motion.     Cervical back: Normal range of motion and neck supple.  Lymphadenopathy:     Cervical: No cervical adenopathy.  Skin:    General: Skin is warm and dry.  Neurological:     Mental Status: She is alert and oriented to person, place, and time.     Deep Tendon Reflexes: Reflexes are normal and symmetric.  Psychiatric:        Behavior: Behavior normal.        Thought Content: Thought content normal.        Judgment: Judgment normal.    BP 119/71   Pulse 78   Temp 98.4 F (36.9 C) (Temporal)   Ht 5' 4" (1.626 m)   Wt 195 lb 8 oz (88.7 kg)   BMI 33.56 kg/m         Assessment & Plan:  Julia Poole comes in today with chief complaint of Medical Management of Chronic Issues (pt here today for refill on Tylenol #3)   Diagnosis and orders addressed:  1. Mixed hyperlipidemia Low fat diet - rosuvastatin (CRESTOR) 10 MG tablet; Take 1 tablet (10 mg total) by mouth daily.  Dispense: 90 tablet; Refill: 1 - CMP14+EGFR - CBC with Differential/Platelet - Lipid panel  2. Hypothyroidism due to acquired atrophy of thyroid Labs pending - levothyroxine  (SYNTHROID) 50 MCG tablet; Take 1 tablet (50 mcg total) by mouth daily.  Dispense: 90 tablet; Refill: 1 - Thyroid Panel With TSH  3. Spondylolisthesis of lumbar region - acetaminophen-codeine (TYLENOL #3) 300-30 MG tablet; Take  1 tablet by mouth every 6 (six) hours as needed for moderate pain.  Dispense: 120 tablet; Refill: 2  4. BMI 31.0-31.9,adult Discussed diet and exercise for person with BMI >25 Will recheck weight in 3-6 months  5. Atypical meningioma of brain [facility])   Labs pending Health Maintenance reviewed Diet and exercise encouraged  Follow up plan: 6 months   [place], FNP

## 2019-07-30 NOTE — Telephone Encounter (Signed)
Appointment schedule  

## 2019-10-18 ENCOUNTER — Ambulatory Visit: Payer: Self-pay | Admitting: Nurse Practitioner

## 2019-12-21 DIAGNOSIS — Z1231 Encounter for screening mammogram for malignant neoplasm of breast: Secondary | ICD-10-CM | POA: Diagnosis not present

## 2019-12-24 DIAGNOSIS — R928 Other abnormal and inconclusive findings on diagnostic imaging of breast: Secondary | ICD-10-CM

## 2020-01-07 DIAGNOSIS — R921 Mammographic calcification found on diagnostic imaging of breast: Secondary | ICD-10-CM | POA: Diagnosis not present

## 2020-01-24 ENCOUNTER — Ambulatory Visit (INDEPENDENT_AMBULATORY_CARE_PROVIDER_SITE_OTHER): Payer: Medicare Other

## 2020-01-24 DIAGNOSIS — Z0000 Encounter for general adult medical examination without abnormal findings: Secondary | ICD-10-CM

## 2020-01-24 NOTE — Progress Notes (Signed)
MEDICARE ANNUAL WELLNESS VISIT  01/24/2020  Telephone Visit Disclaimer This Medicare AWV was conducted by telephone due to national recommendations for restrictions regarding the COVID-19 Pandemic (e.g. social distancing).  I verified, using two identifiers, that I am speaking with Julia Poole or their authorized healthcare agent. I discussed the limitations, risks, security, and privacy concerns of performing an evaluation and management service by telephone and the potential availability of an in-person appointment in the future. The patient expressed understanding and agreed to proceed.  Location of Patient: Home Location of Provider (nurse):  [facility]  Subjective:    Julia Poole is a 64 y.o. female patient of Julia Poole, [place] who had a Medicare Annual Wellness Visit today via telephone. Julia Poole is Retired and lives with their spouse. She  has one biological son and two step-children, she has no grandchildren.. She reports that she is socially active and does interact with friends/family regularly. She is moderately physically active and enjoys playing the piano or keyboard, doing word search puzzles and baking for her family and friends..  Patient Care Team: Julia Pretty, FNP as PCP - General (Nurse Practitioner) Merilyn Baba, MD as Referring Physician (Internal Medicine) Consuella Lose, MD as Consulting Physician (Neurosurgery)  Advanced Directives 01/24/2020 01/20/2019 09/25/2017 08/28/2016 08/09/2016 07/26/2016 11/15/2015  Does Patient Have a Medical Advance Directive? No No No No No No No  Would patient like information on creating a medical advance directive? No - Patient declined No - Patient declined No - Patient declined Yes (MAU/Ambulatory/Procedural Areas - Information given) - - Yes - Educational materials given    Hospital Utilization Over the Past 12 Months: # of hospitalizations or ER visits: 0 # of surgeries: 0  Review of Systems      Patient reports that her overall health is unchanged compared to last year.  History obtained from chart review and the patient  Patient Reported Readings (BP, Pulse, CBG, Weight, etc) none  Pain Assessment Pain : 0-10 Pain Score: 4  Pain Type: Chronic pain Pain Location: Back Pain Orientation: Lower Pain Descriptors / Indicators: Aching, Discomfort Pain Onset: More than a month ago Pain Frequency: Occasional Pain Relieving Factors: pain is worse when she is on her feet a lot, taking Tylenol 3 at bedtime  Pain Relieving Factors: pain is worse when she is on her feet a lot, taking Tylenol 3 at bedtime  Current Medications & Allergies (verified) Allergies as of 01/24/2020      Reactions   Norco [hydrocodone-acetaminophen] Nausea Only      Medication List       Accurate as of January 24, 2020  1:32 PM. If you have any questions, ask your nurse or doctor.        acetaminophen-codeine 300-30 MG tablet Commonly known as: TYLENOL #3 Take 1 tablet by mouth every 6 (six) hours as needed for moderate pain.   aspirin 81 MG tablet Take 81 mg by mouth daily.   Biotin 10 MG Caps Take by mouth.   CENTRUM SILVER PO Take by mouth.   COCONUT OIL PO Take by mouth.   cyanocobalamin 100 MCG tablet Take 100 mcg by mouth daily.   Fiber Choice 1.5 g Chew Generic drug: Inulin Chew by mouth.   FLAXSEED (LINSEED) PO Take by mouth.   fluticasone 50 MCG/ACT nasal spray Commonly known as: FLONASE Place 2 sprays into both nostrils daily. What changed:   when to take this  reasons to take this   levothyroxine 50  MCG tablet Commonly known as: SYNTHROID Take 1 tablet (50 mcg total) by mouth daily.   OSTEO BI-FLEX REGULAR STRENGTH PO Take by mouth.   rosuvastatin 10 MG tablet Commonly known as: CRESTOR Take 1 tablet (10 mg total) by mouth daily.   TURMERIC PO Take by mouth.   vitamin C 500 MG tablet Commonly known as: ASCORBIC ACID Take 500 mg by mouth daily.    Vitamin D 50 MCG (2000 UT) tablet Take 2,000 Units by mouth 2 (two) times daily.   vitamin E 180 MG (400 UNITS) capsule Generic drug: vitamin E Take 400 Units by mouth daily.       History (reviewed): Past Medical History:  Diagnosis Date  . Allergy   . Arthritis   . Bleeding nose   . Cancer [facility]) 2010   right breast  . Cancer ([place]) 09/2015   brain  . Cataract   . Degenerative disk disease 2012  . Family history of breast cancer in female   . Family history of colon cancer   . Family history of ovarian cancer   . Hemorrhoids   . Hyperlipidemia   . Sinus problem   . Thyroid disease    Past Surgical History:  Procedure Laterality Date  . BRAIN SURGERY    . BREAST SURGERY  AUG. 2010   right breast lumpectomy  . BREAST SURGERY     Cancer  . COLONOSCOPY    . CRANIOTOMY Left 10/16/2015   Procedure: PARA-SAGITTAL CRANIOTOMY WITH STEREOTACTIC NAVIGATION FOR TUMOR;  Surgeon: Kevan Ny Ditty, MD;  Location: [place] [facility];  Service: Neurosurgery;  Laterality: Left;  . GANGLION CYST EXCISION  SEP. 1997   RIGHT WRIST   Family History  Problem Relation Age of Onset  . Colon cancer Mother 15  . Diabetes Father   . Heart disease Father   . Cancer Father        skin  . Breast cancer Brother 36  . Cancer Brother        bone mets  . Leukemia Sister 45       CLL  . Cancer Brother        skin  . Ovarian cancer Maternal Aunt        dx in her 88s  . Tuberculosis Maternal Grandmother   . Heart disease Maternal Grandfather   . Diabetes Paternal Grandmother   . Alcohol abuse Paternal Grandfather   . Esophageal cancer Neg Hx   . Rectal cancer Neg Hx   . Stomach cancer Neg Hx    Social History   Socioeconomic History  . Marital status: Married    Spouse name: Not on file  . Number of children: 1  . Years of education: 20  . Highest education level: 12th grade  Occupational History  . Occupation: Librarian, academic. [employer]  Tobacco Use  . Smoking status: Never Smoker  .  Smokeless tobacco: Never Used  Vaping Use  . Vaping Use: Never used  Substance and Sexual Activity  . Alcohol use: No  . Drug use: No  . Sexual activity: Yes    Birth control/protection: Post-menopausal  Other Topics Concern  . Not on file  Social History Narrative  . Not on file   Social Determinants of Health   Financial Resource Strain:   . Difficulty of Paying Living Expenses: Not on file  Food Insecurity:   . Worried About Charity fundraiser in the Last Year: Not on file  . Ran Out of Food  in the Last Year: Not on file  Transportation Needs:   . Lack of Transportation (Medical): Not on file  . Lack of Transportation (Non-Medical): Not on file  Physical Activity:   . Days of Exercise per Week: Not on file  . Minutes of Exercise per Session: Not on file  Stress:   . Feeling of Stress : Not on file  Social Connections:   . Frequency of Communication with Friends and Family: Not on file  . Frequency of Social Gatherings with Friends and Family: Not on file  . Attends Religious Services: Not on file  . Active Member of Clubs or Organizations: Not on file  . Attends Archivist Meetings: Not on file  . Marital Status: Not on file    Activities of Daily Living In your present state of health, do you have any difficulty performing the following activities: 01/24/2020  Hearing? N  Vision? N  Difficulty concentrating or making decisions? N  Walking or climbing stairs? Y  Comment frequent walking and climbing stairs causes back pain  Dressing or bathing? N  Doing errands, shopping? N  Preparing Food and eating ? N  Using the Toilet? N  In the past six months, have you accidently leaked urine? N  Do you have problems with loss of bowel control? N  Managing your Medications? N  Managing your Finances? N  Housekeeping or managing your Housekeeping? N  Some recent data might be hidden   Patient reports lower back pain when she spends too much time on her feet or  does a lot of walking.  She normally will sit down and rest and the pain will improve.  Patient Education/ Literacy How often do you need to have someone help you when you read instructions, pamphlets, or other written materials from your doctor or pharmacy?: 1 - Never What is the last grade level you completed in school?: 12th grade  Exercise Current Exercise Habits: Home exercise routine, Type of exercise: walking, Time (Minutes): 45, Frequency (Times/Week): 6, Weekly Exercise (Minutes/Week): 270, Intensity: Mild, Exercise limited by: orthopedic condition(s)  Diet Patient reports consuming 3 meals a day and 1 snack(s) a day Patient reports that her primary diet is: Regular Patient reports that she does have regular access to food.   Depression Screen PHQ 2/9 Scores 01/24/2020 07/30/2019 04/16/2019 01/20/2019 10/14/2018 01/06/2018 09/25/2017  PHQ - 2 Score 0 0 0 0 0 0 0     Fall Risk Fall Risk  01/24/2020 07/30/2019 04/16/2019 01/20/2019 10/14/2018  Falls in the past year? 0 0 0 0 0  Follow up Falls evaluation completed - - - -     Objective:  Julia Poole seemed alert and oriented and she participated appropriately during our telephone visit.  Blood Pressure Weight BMI  BP Readings from Last 3 Encounters:  07/30/19 119/71  04/16/19 127/74  10/14/18 120/70   Wt Readings from Last 3 Encounters:  07/30/19 195 lb 8 oz (88.7 kg)  04/16/19 196 lb (88.9 kg)  10/14/18 192 lb (87.1 kg)   BMI Readings from Last 1 Encounters:  07/30/19 33.56 kg/m    *Unable to obtain current vital signs, weight, and BMI due to telephone visit type  Hearing/Vision  . Julia Poole did not seem to have difficulty with hearing/understanding during the telephone conversation . Reports that she has not had a formal eye exam by an eye care professional within the past year . Reports that she has not had a formal hearing evaluation within  the past year *Unable to fully assess hearing and vision during telephone  visit type  Cognitive Function: 6CIT Screen 01/24/2020 01/20/2019  What Year? - 0 points  What month? - 0 points  What time? 0 points 0 points  Count back from 20 0 points 0 points  Months in reverse 0 points 0 points  Repeat phrase 2 points 0 points  Total Score - 0   (Normal:0-7, Significant for Dysfunction: >8)  Normal Cognitive Function Screening: Yes   Immunization & Health Maintenance Record Immunization History  Administered Date(s) Administered  . Fluad Quad(high Dose 65+) 01/27/2019  . Influenza,inj,Quad PF,6+ Mos 01/01/2013, 01/19/2014, 02/09/2015, 02/26/2016, 01/06/2018  . Influenza-Unspecified 12/30/2013  . Moderna SARS-COVID-2 Vaccination 08/16/2019, 09/13/2019  . Pneumococcal Conjugate-13 01/27/2019  . Zoster 05/19/2015    Health Maintenance  Topic Date Due  . TETANUS/TDAP  Never done  . INFLUENZA VACCINE  10/31/2019  . PNA vac Low Risk Adult (2 of 2 - PPSV23) 01/27/2020  . DEXA SCAN  04/15/2021  . COLONOSCOPY  08/09/2021  . MAMMOGRAM  01/06/2022  . COVID-19 Vaccine  Completed  . Hepatitis C Screening  Completed       Assessment  This is a routine wellness examination for Julia Poole.  Health Maintenance: Due or Overdue Health Maintenance Due  Topic Date Due  . TETANUS/TDAP  Never done  . INFLUENZA VACCINE  10/31/2019    Julia Poole does not need a referral for Community Assistance: Care Management:   no Social Work:    no Prescription Assistance:  no Nutrition/Diabetes Education:  no   Plan:  Personalized Goals Goals Addressed            This Visit's Progress   . Patient Stated       01/24/2020 AWV Goal: Fall Prevention  . Over the next year, patient will decrease their risk for falls by: o Using assistive devices, such as a cane or walker, as needed o Identifying fall risks within their home and correcting them by: - Removing throw rugs - Adding handrails to stairs or ramps - Removing clutter and keeping a clear pathway  throughout the home - Increasing light, especially at night - Adding shower handles/bars - Raising toilet seat o Identifying potential personal risk factors for falls: - Medication side effects - Incontinence/urgency - Vestibular dysfunction - Hearing loss - Musculoskeletal disorders - Neurological disorders - Orthostatic hypotension        Personalized Health Maintenance & Screening Recommendations  Pneumococcal vaccine  Influenza vaccine Td vaccine Glaucoma screening  Lung Cancer Screening Recommended: no (Low Dose CT Chest recommended if Age 63-80 years, 30 pack-year currently smoking OR have quit w/in past 15 years) Hepatitis C Screening recommended: no HIV Screening recommended: no  Advanced Directives: Written information was not prepared per patient's request.  Referrals & Orders No orders of the defined types were placed in this encounter.   Follow-up Plan . Follow-up with Julia Pretty, FNP as planned . Schedule eye exam . Tdap vaccine . Pneumovax vaccine . Shingrix vaccine . Influenza vaccine   I have personally reviewed and noted the following in the patient's chart:   . Medical and social history . Use of alcohol, tobacco or illicit drugs  . Current medications and supplements . Functional ability and status . Nutritional status . Physical activity . Advanced directives . List of other physicians . Hospitalizations, surgeries, and ER visits in previous 12 months . Vitals . Screenings to include cognitive, depression, and falls .  Referrals and appointments  In addition, I have reviewed and discussed with Julia Poole certain preventive protocols, quality metrics, and best practice recommendations. A written personalized care plan for preventive services as well as general preventive health recommendations is available and can be mailed to the patient at her request.      Julia Coyer, LPN    [id number]   Patient declines after visit  summary

## 2020-01-26 ENCOUNTER — Other Ambulatory Visit: Payer: Self-pay | Admitting: Radiology

## 2020-01-26 DIAGNOSIS — N6012 Diffuse cystic mastopathy of left breast: Secondary | ICD-10-CM | POA: Diagnosis not present

## 2020-01-26 DIAGNOSIS — R921 Mammographic calcification found on diagnostic imaging of breast: Secondary | ICD-10-CM | POA: Diagnosis not present

## 2020-01-27 ENCOUNTER — Encounter: Payer: Self-pay | Admitting: Nurse Practitioner

## 2020-01-27 ENCOUNTER — Ambulatory Visit (INDEPENDENT_AMBULATORY_CARE_PROVIDER_SITE_OTHER): Payer: Medicare Other | Admitting: Nurse Practitioner

## 2020-01-27 ENCOUNTER — Other Ambulatory Visit: Payer: Self-pay

## 2020-01-27 VITALS — BP 127/80 | HR 63 | Temp 97.6°F | Resp 20 | Ht 64.0 in | Wt 193.0 lb

## 2020-01-27 DIAGNOSIS — M4316 Spondylolisthesis, lumbar region: Secondary | ICD-10-CM | POA: Diagnosis not present

## 2020-01-27 DIAGNOSIS — E782 Mixed hyperlipidemia: Secondary | ICD-10-CM

## 2020-01-27 DIAGNOSIS — D420 Neoplasm of uncertain behavior of cerebral meninges: Secondary | ICD-10-CM | POA: Diagnosis not present

## 2020-01-27 DIAGNOSIS — Z6831 Body mass index (BMI) 31.0-31.9, adult: Secondary | ICD-10-CM

## 2020-01-27 DIAGNOSIS — E034 Atrophy of thyroid (acquired): Secondary | ICD-10-CM | POA: Diagnosis not present

## 2020-01-27 DIAGNOSIS — Z79891 Long term (current) use of opiate analgesic: Secondary | ICD-10-CM | POA: Diagnosis not present

## 2020-01-27 DIAGNOSIS — C50911 Malignant neoplasm of unspecified site of right female breast: Secondary | ICD-10-CM | POA: Diagnosis not present

## 2020-01-27 DIAGNOSIS — Z23 Encounter for immunization: Secondary | ICD-10-CM | POA: Diagnosis not present

## 2020-01-27 MED ORDER — ROSUVASTATIN CALCIUM 10 MG PO TABS: 10.0000 mg | ORAL_TABLET | Freq: Every day | ORAL | 1 refills | Status: DC

## 2020-01-27 MED ORDER — LEVOTHYROXINE SODIUM 50 MCG PO TABS: 50.0000 ug | ORAL_TABLET | Freq: Every day | ORAL | 1 refills | Status: DC

## 2020-01-27 MED ORDER — ACETAMINOPHEN-CODEINE #3 300-30 MG PO TABS: 1.0000 | ORAL_TABLET | Freq: Four times a day (QID) | ORAL | 2 refills | Status: DC | PRN

## 2020-01-27 NOTE — Addendum Note (Signed)
Addended by: Rolena Infante on: 01/27/2020 10:54 AM   Modules accepted: Orders

## 2020-01-27 NOTE — Patient Instructions (Signed)

## 2020-01-27 NOTE — Progress Notes (Signed)
 Subjective:    Patient ID: Julia Poole, female    DOB: 02/12/1956, 64 y.o.   MRN: [id number]   Chief Complaint: Medical Management of Chronic Issues    HPI:  1. Mixed hyperlipidemia Dies try to watch diet but does little to no exercise. Lab Results  Component Value Date   CHOL 146 04/16/2019   HDL 53 04/16/2019   LDLCALC 77 04/16/2019   TRIG 84 04/16/2019   CHOLHDL 2.8 04/16/2019     2. Hypothyroidism due to acquired atrophy of thyroid No problems that she is aware of. Lab Results  Component Value Date   TSH 1.870 04/16/2019     3. Atypical meningioma of brain (HCC) Had brain surgery in 2017. Has had no residual effect. She is doing well. denies headaches or blurred vision.  4. Malignant neoplasm of right female breast, unspecified estrogen receptor status, unspecified site of breast (HCC) Was dx back in 2013. She has yearly mammograms. She does check her breasts often.  5. Lumbar spondylolisthesis Pain assessment: Cause of pain- spondylolistheisi Pain location- lumbar Pain on scale of 1-10- 2/10 currently Frequency- daily What increases pain-to much activity What makes pain Better-rest Effects on ADL - none Any change in general medical condition-none  Current opioids rx- tylenol #3 # meds rx- 120 Effectiveness of current meds-works welll for her Adverse reactions from pain meds-none Morphine equivalent- 18 MME  Pill count performed-No Last drug screen - 10/14/18 ( high risk q3m, moderate risk q6m, low risk yearly ) Urine drug screen today- Yes Was the [facility] reviewed- yes  If yes were their any concerning findings? - no   Overdose risk: 1 Opioid Risk  10/14/2018  Alcohol 0  Illegal Drugs 0  Rx Drugs 0  Alcohol 0  Illegal Drugs 0  Rx Drugs 0  Age between 16-45 years  0  History of Preadolescent Sexual Abuse 0  Psychological Disease 0  Depression 0  Opioid Risk Tool Scoring 0  Opioid Risk Interpretation Low Risk     Pain contract signed  on:01/27/20    6.  BMI 31.0-31.9,adult No recent weight changes Wt Readings from Last 3 Encounters:  01/27/20 193 lb (87.5 kg)  07/30/19 195 lb 8 oz (88.7 kg)  04/16/19 196 lb (88.9 kg)   BMI Readings from Last 3 Encounters:  01/27/20 33.13 kg/m  07/30/19 33.56 kg/m  04/16/19 33.64 kg/m       Outpatient Encounter Medications as of 01/27/2020  Medication Sig  . acetaminophen-codeine (TYLENOL #3) 300-30 MG tablet Take 1 tablet by mouth every 6 (six) hours as needed for moderate pain.  . Ascorbic Acid (VITAMIN C) 500 MG tablet Take 500 mg by mouth daily.   . aspirin 81 MG tablet Take 81 mg by mouth daily.    . Biotin 10 MG CAPS Take by mouth.  . Cholecalciferol (VITAMIN D) 2000 UNITS tablet Take 2,000 Units by mouth 2 (two) times daily.  . COCONUT OIL PO Take by mouth.  . cyanocobalamin 100 MCG tablet Take 100 mcg by mouth daily.  . FLAXSEED, LINSEED, PO Take by mouth.  . fluticasone (FLONASE) 50 MCG/ACT nasal spray Place 2 sprays into both nostrils daily. (Patient taking differently: Place 2 sprays into both nostrils daily as needed. )  . Glucosamine-Chondroitin (OSTEO BI-FLEX REGULAR STRENGTH PO) Take by mouth.  . Inulin (FIBER CHOICE) 1.5 g CHEW Chew by mouth.  . levothyroxine (SYNTHROID) 50 MCG tablet Take 1 tablet (50 mcg total) by mouth daily.  . Multiple   Vitamins-Minerals (CENTRUM SILVER PO) Take by mouth.    . rosuvastatin (CRESTOR) 10 MG tablet Take 1 tablet (10 mg total) by mouth daily.  . TURMERIC PO Take by mouth.  . vitamin E (VITAMIN E) 400 UNIT capsule Take 400 Units by mouth daily.   Facility-Administered Encounter Medications as of 01/27/2020  Medication  . 0.9 %  sodium chloride infusion    Past Surgical History:  Procedure Laterality Date  . BRAIN SURGERY    . BREAST SURGERY  AUG. 2010   right breast lumpectomy  . BREAST SURGERY     Cancer  . COLONOSCOPY    . CRANIOTOMY Left 10/16/2015   Procedure: PARA-SAGITTAL CRANIOTOMY WITH STEREOTACTIC  NAVIGATION FOR TUMOR;  Surgeon: Kevan Ny Ditty, MD;  Location: [place] [facility];  Service: Neurosurgery;  Laterality: Left;  . GANGLION CYST EXCISION  SEP. 1997   RIGHT WRIST    Family History  Problem Relation Age of Onset  . Colon cancer Mother 66  . Diabetes Father   . Heart disease Father   . Cancer Father        skin  . Breast cancer Brother 11  . Cancer Brother        bone mets  . Leukemia Sister 52       CLL  . Cancer Brother        skin  . Ovarian cancer Maternal Aunt        dx in her 46s  . Tuberculosis Maternal Grandmother   . Heart disease Maternal Grandfather   . Diabetes Paternal Grandmother   . Alcohol abuse Paternal Grandfather   . Esophageal cancer Neg Hx   . Rectal cancer Neg Hx   . Stomach cancer Neg Hx     New complaints: Patient had mammogram and they found calcifications in left breast- She had boipsy yesterday and is waiting to find out results.  Social history: Lives with husband      Review of Systems  Constitutional: Negative for diaphoresis.  Eyes: Negative for pain.  Respiratory: Negative for shortness of breath.   Cardiovascular: Negative for chest pain, palpitations and leg swelling.  Gastrointestinal: Negative for abdominal pain.  Endocrine: Negative for polydipsia.  Skin: Negative for rash.  Neurological: Negative for dizziness, weakness and headaches.  Hematological: Does not bruise/bleed easily.  All other systems reviewed and are negative.      Objective:   Physical Exam Vitals and nursing note reviewed.  Constitutional:      General: She is not in acute distress.    Appearance: Normal appearance. She is well-developed.  HENT:     Head: Normocephalic.     Nose: Nose normal.  Eyes:     Pupils: Pupils are equal, round, and reactive to light.  Neck:     Vascular: No carotid bruit or JVD.  Cardiovascular:     Rate and Rhythm: Normal rate and regular rhythm.     Heart sounds: Normal heart sounds.  Pulmonary:      Effort: Pulmonary effort is normal. No respiratory distress.     Breath sounds: Normal breath sounds. No wheezing or rales.  Chest:     Chest Korf: No tenderness.     Comments: Slight drainage on left breast dressing Abdominal:     General: Bowel sounds are normal. There is no distension or abdominal bruit.     Palpations: Abdomen is soft. There is no hepatomegaly, splenomegaly, mass or pulsatile mass.     Tenderness: There is no abdominal  tenderness.  Musculoskeletal:        General: Normal range of motion.     Cervical back: Normal range of motion and neck supple.  Lymphadenopathy:     Cervical: No cervical adenopathy.  Skin:    General: Skin is warm and dry.  Neurological:     Mental Status: She is alert and oriented to person, place, and time.     Deep Tendon Reflexes: Reflexes are normal and symmetric.  Psychiatric:        Behavior: Behavior normal.        Thought Content: Thought content normal.        Judgment: Judgment normal.     BP 127/80   Pulse 63   Temp 97.6 F (36.4 C) (Temporal)   Resp 20   Ht 5' 4" (1.626 m)   Wt 193 lb (87.5 kg)   SpO2 99%   BMI 33.13 kg/m        Assessment & Plan:  Antrice Pal Lyles comes in today with chief complaint of Medical Management of Chronic Issues   Diagnosis and orders addressed:  1. Mixed hyperlipidemia Low fat diet - CBC with Differential/Platelet - CMP14+EGFR - Lipid panel - rosuvastatin (CRESTOR) 10 MG tablet; Take 1 tablet (10 mg total) by mouth daily.  Dispense: 90 tablet; Refill: 1  2. Hypothyroidism due to acquired atrophy of thyroid Labs pending - Thyroid Panel With TSH - levothyroxine (SYNTHROID) 50 MCG tablet; Take 1 tablet (50 mcg total) by mouth daily.  Dispense: 90 tablet; Refill: 1  3. Atypical meningioma of brain ([place])  4. Malignant neoplasm of right female breast, unspecified estrogen receptor status, unspecified site of breast [facility]) Patient will let me know results of breast bx  5. BMI  31.0-31.9,adult Discussed diet and exercise for person with BMI >25 Will recheck weight in 3-6 months  6. Spondylolisthesis of lumbar region Moist heat  rest - acetaminophen-codeine (TYLENOL #3) 300-30 MG tablet; Take 1 tablet by mouth every 6 (six) hours as needed for moderate pain.  Dispense: 120 tablet; Refill: 2 - ToxASSURE Select 13 (MW), Urine   Labs pending Health Maintenance reviewed Diet and exercise encouraged  Follow up plan: 3 months   [name] Done, FNP

## 2020-01-28 LAB — CMP14+EGFR
ALT: 22 IU/L (ref 0–32)
AST: 22 IU/L (ref 0–40)
Albumin/Globulin Ratio: 1.9 (ref 1.2–2.2)
Albumin: 4.6 g/dL (ref 3.8–4.8)
Alkaline Phosphatase: 87 IU/L (ref 44–121)
BUN/Creatinine Ratio: 21 (ref 12–28)
BUN: 14 mg/dL (ref 8–27)
Bilirubin Total: 1 mg/dL (ref 0.0–1.2)
CO2: 25 mmol/L (ref 20–29)
Calcium: 10.1 mg/dL (ref 8.7–10.3)
Chloride: 102 mmol/L (ref 96–106)
Creatinine, Ser: 0.67 mg/dL (ref 0.57–1.00)
GFR calc Af Amer: 106 mL/min/{1.73_m2} (ref >59)
GFR calc non Af Amer: 92 mL/min/{1.73_m2} (ref >59)
Globulin, Total: 2.4 g/dL (ref 1.5–4.5)
Glucose: 97 mg/dL (ref 65–99)
Potassium: 4.8 mmol/L (ref 3.5–5.2)
Sodium: 140 mmol/L (ref 134–144)
Total Protein: 7 g/dL (ref 6.0–8.5)

## 2020-01-28 LAB — CBC WITH DIFFERENTIAL/PLATELET
Basophils Absolute: 0 10*3/uL (ref 0.0–0.2)
Basos: 0 %
EOS (ABSOLUTE): 0.2 10*3/uL (ref 0.0–0.4)
Eos: 3 %
Hematocrit: 42.4 % (ref 34.0–46.6)
Hemoglobin: 14.4 g/dL (ref 11.1–15.9)
Immature Grans (Abs): 0 10*3/uL (ref 0.0–0.1)
Immature Granulocytes: 1 %
Lymphocytes Absolute: 2.3 10*3/uL (ref 0.7–3.1)
Lymphs: 37 %
MCH: 31.4 pg (ref 26.6–33.0)
MCHC: 34 g/dL (ref 31.5–35.7)
MCV: 92 fL (ref 79–97)
Monocytes Absolute: 0.4 10*3/uL (ref 0.1–0.9)
Monocytes: 6 %
Neutrophils Absolute: 3.4 10*3/uL (ref 1.4–7.0)
Neutrophils: 53 %
Platelets: 251 10*3/uL (ref 150–450)
RBC: 4.59 x10E6/uL (ref 3.77–5.28)
RDW: 12.7 % (ref 11.7–15.4)
WBC: 6.3 10*3/uL (ref 3.4–10.8)

## 2020-01-28 LAB — LIPID PANEL
Chol/HDL Ratio: 3.1 ratio (ref 0.0–4.4)
Cholesterol, Total: 162 mg/dL (ref 100–199)
HDL: 53 mg/dL (ref >39)
LDL Chol Calc (NIH): 89 mg/dL (ref 0–99)
Triglycerides: 114 mg/dL (ref 0–149)
VLDL Cholesterol Cal: 20 mg/dL (ref 5–40)

## 2020-01-28 LAB — THYROID PANEL WITH TSH
Free Thyroxine Index: 1.7 (ref 1.2–4.9)
T3 Uptake Ratio: 23 % — ABNORMAL LOW (ref 24–39)
T4, Total: 7.5 ug/dL (ref 4.5–12.0)
TSH: 2.58 u[IU]/mL (ref 0.450–4.500)

## 2020-01-31 LAB — TOXASSURE SELECT 13 (MW), URINE

## 2020-05-01 ENCOUNTER — Ambulatory Visit (INDEPENDENT_AMBULATORY_CARE_PROVIDER_SITE_OTHER): Payer: Medicare Other | Admitting: Nurse Practitioner

## 2020-05-01 ENCOUNTER — Other Ambulatory Visit: Payer: Self-pay

## 2020-05-01 ENCOUNTER — Encounter: Payer: Self-pay | Admitting: Nurse Practitioner

## 2020-05-01 VITALS — BP 141/82 | HR 83 | Temp 97.7°F | Resp 20 | Ht 64.0 in | Wt 195.0 lb

## 2020-05-01 DIAGNOSIS — D420 Neoplasm of uncertain behavior of cerebral meninges: Secondary | ICD-10-CM

## 2020-05-01 DIAGNOSIS — E782 Mixed hyperlipidemia: Secondary | ICD-10-CM | POA: Diagnosis not present

## 2020-05-01 DIAGNOSIS — E034 Atrophy of thyroid (acquired): Secondary | ICD-10-CM | POA: Diagnosis not present

## 2020-05-01 DIAGNOSIS — Z6831 Body mass index (BMI) 31.0-31.9, adult: Secondary | ICD-10-CM

## 2020-05-01 MED ORDER — LEVOTHYROXINE SODIUM 50 MCG PO TABS: 50.0000 ug | ORAL_TABLET | Freq: Every day | ORAL | 1 refills | Status: DC

## 2020-05-01 MED ORDER — ROSUVASTATIN CALCIUM 10 MG PO TABS: 10.0000 mg | ORAL_TABLET | Freq: Every day | ORAL | 1 refills | Status: DC

## 2020-05-01 NOTE — Progress Notes (Signed)
Subjective:    Patient ID: Julia Poole, female    DOB: 1955/11/24, 65 y.o.   MRN: [id number]   Chief Complaint: Medical Management of Chronic Issues    HPI:  1. Mixed hyperlipidemia Does try to watch diet and does little to no dedicated exercise. Lab Results  Component Value Date   CHOL 162 01/27/2020   HDL 53 01/27/2020   LDLCALC 89 01/27/2020   TRIG 114 01/27/2020   CHOLHDL 3.1 01/27/2020     2. Hypothyroidism due to acquired atrophy of thyroid No problems that she is aware of. Lab Results  Component Value Date   TSH 2.580 01/27/2020     3. Atypical meningioma of brain [facility]) Is currently doing well. Has had no flare ups. Has no residual effects from brain surgery.  4. BMI 31.0-31.9,adult No recent weight changes Wt Readings from Last 3 Encounters:  05/01/20 195 lb (88.5 kg)  01/27/20 193 lb (87.5 kg)  07/30/19 195 lb 8 oz (88.7 kg)   BMI Readings from Last 3 Encounters:  05/01/20 33.47 kg/m  01/27/20 33.13 kg/m  07/30/19 33.56 kg/m       Outpatient Encounter Medications as of 05/01/2020  Medication Sig  . acetaminophen-codeine (TYLENOL #3) 300-30 MG tablet Take 1 tablet by mouth every 6 (six) hours as needed for moderate pain.  . Ascorbic Acid (VITAMIN C) 500 MG tablet Take 500 mg by mouth daily.   Marland Kitchen aspirin 81 MG tablet Take 81 mg by mouth daily.    . Biotin 10 MG CAPS Take by mouth.  . Cholecalciferol (VITAMIN D) 2000 UNITS tablet Take 2,000 Units by mouth 2 (two) times daily.  . COCONUT OIL PO Take by mouth.  . cyanocobalamin 100 MCG tablet Take 100 mcg by mouth daily.  Marland Kitchen FLAXSEED, LINSEED, PO Take by mouth.  . fluticasone (FLONASE) 50 MCG/ACT nasal spray Place 2 sprays into both nostrils daily. (Patient taking differently: Place 2 sprays into both nostrils daily as needed. )  . Glucosamine-Chondroitin (OSTEO BI-FLEX REGULAR STRENGTH PO) Take by mouth.  . Inulin (FIBER CHOICE) 1.5 g CHEW Chew by mouth.  . levothyroxine (SYNTHROID) 50 MCG tablet  Take 1 tablet (50 mcg total) by mouth daily.  . Multiple Vitamins-Minerals (CENTRUM SILVER PO) Take by mouth.    . rosuvastatin (CRESTOR) 10 MG tablet Take 1 tablet (10 mg total) by mouth daily.  . TURMERIC PO Take by mouth.  . vitamin E (VITAMIN E) 400 UNIT capsule Take 400 Units by mouth daily.     Past Surgical History:  Procedure Laterality Date  . BRAIN SURGERY    . BREAST SURGERY  AUG. 2010   right breast lumpectomy  . BREAST SURGERY     Cancer  . COLONOSCOPY    . CRANIOTOMY Left 10/16/2015   Procedure: PARA-SAGITTAL CRANIOTOMY WITH STEREOTACTIC NAVIGATION FOR TUMOR;  Surgeon: Kevan Ny Ditty, MD;  Location: [place] [facility];  Service: Neurosurgery;  Laterality: Left;  . GANGLION CYST EXCISION  SEP. 1997   RIGHT WRIST    Family History  Problem Relation Age of Onset  . Colon cancer Mother 43  . Diabetes Father   . Heart disease Father   . Cancer Father        skin  . Breast cancer Brother 65  . Cancer Brother        bone mets  . Leukemia Sister 56       CLL  . Cancer Brother        skin  .  Ovarian cancer Maternal Aunt        dx in her 54s  . Tuberculosis Maternal Grandmother   . Heart disease Maternal Grandfather   . Diabetes Paternal Grandmother   . Alcohol abuse Paternal Grandfather   . Esophageal cancer Neg Hx   . Rectal cancer Neg Hx   . Stomach cancer Neg Hx     New complaints: None today  Social history: Lives with her husband  Controlled substance contract: n/a    Review of Systems  Constitutional: Negative for diaphoresis.  Eyes: Negative for pain.  Respiratory: Negative for shortness of breath.   Cardiovascular: Negative for chest pain, palpitations and leg swelling.  Gastrointestinal: Negative for abdominal pain.  Endocrine: Negative for polydipsia.  Skin: Negative for rash.  Neurological: Negative for dizziness, weakness and headaches.  Hematological: Does not bruise/bleed easily.  All other systems reviewed and are negative.       Objective:   Physical Exam Vitals and nursing note reviewed.  Constitutional:      General: She is not in acute distress.    Appearance: Normal appearance. She is well-developed and well-nourished.  HENT:     Head: Normocephalic.     Nose: Nose normal.     Mouth/Throat:     Mouth: Oropharynx is clear and moist.  Eyes:     Extraocular Movements: EOM normal.     Pupils: Pupils are equal, round, and reactive to light.  Neck:     Vascular: No carotid bruit or JVD.  Cardiovascular:     Rate and Rhythm: Normal rate and regular rhythm.     Pulses: Intact distal pulses.     Heart sounds: Normal heart sounds.  Pulmonary:     Effort: Pulmonary effort is normal. No respiratory distress.     Breath sounds: Normal breath sounds. No wheezing or rales.  Chest:     Chest Lem[place]: No tenderness.  Abdominal:     General: Bowel sounds are normal. There is no distension or abdominal bruit. Aorta is normal.     Palpations: Abdomen is soft. There is no hepatomegaly, splenomegaly, mass or pulsatile mass.     Tenderness: There is no abdominal tenderness.  Musculoskeletal:        General: No edema. Normal range of motion.     Cervical back: Normal range of motion and neck supple.  Lymphadenopathy:     Cervical: No cervical adenopathy.  Skin:    General: Skin is warm and dry.  Neurological:     Mental Status: She is alert and oriented to person, place, and time.     Deep Tendon Reflexes: Reflexes are normal and symmetric.  Psychiatric:        Mood and Affect: Mood and affect normal.        Behavior: Behavior normal.        Thought Content: Thought content normal.        Judgment: Judgment normal.     BP (!) 141/82   Pulse 83   Temp 97.7 F (36.5 C) (Temporal)   Resp 20   Ht '5\' 4"'  (1.626 m)   Wt 195 lb (88.5 kg)   BMI 33.47 kg/m        Assessment & Plan:  Julia Poole comes in today with chief complaint of Medical Management of Chronic Issues   Diagnosis and orders  addressed:  1. Mixed hyperlipidemia Low fat diet - rosuvastatin (CRESTOR) 10 MG tablet; Take 1 tablet (10 mg total) by mouth daily.  Dispense: 90 tablet; Refill: 1 - CBC with Differential/Platelet - CMP14+EGFR - Lipid panel  2. Hypothyroidism due to acquired atrophy of thyroid Labs pending - levothyroxine (SYNTHROID) 50 MCG tablet; Take 1 tablet (50 mcg total) by mouth daily.  Dispense: 90 tablet; Refill: 1 - Thyroid Panel With TSH  3. Atypical meningioma of brain [facility]) Keep follow up with specialist  4. BMI 31.0-31.9,adult Discussed diet and exercise for person with BMI >25 Will recheck weight in 3-6 months   Labs pending Health Maintenance reviewed Diet and exercise encouraged  Follow up plan: 6 months   [name] Done, FNP

## 2020-05-01 NOTE — Patient Instructions (Signed)
Exercising to Stay Healthy To become healthy and stay healthy, it is recommended that you do moderate-intensity and vigorous-intensity exercise. You can tell that you are exercising at a moderate intensity if your heart starts beating faster and you start breathing faster but can still hold a conversation. You can tell that you are exercising at a vigorous intensity if you are breathing much harder and faster and cannot hold a conversation while exercising. Exercising regularly is important. It has many health benefits, such as:  Improving overall fitness, flexibility, and endurance.  Increasing bone density.  Helping with weight control.  Decreasing body fat.  Increasing muscle strength.  Reducing stress and tension.  Improving overall health. How often should I exercise? Choose an activity that you enjoy, and set realistic goals. Your health care provider can help you make an activity plan that works for you. Exercise regularly as told by your health care provider. This may include:  Doing strength training two times a week, such as: ? Lifting weights. ? Using resistance bands. ? Push-ups. ? Sit-ups. ? Yoga.  Doing a certain intensity of exercise for a given amount of time. Choose from these options: ? A total of 150 minutes of moderate-intensity exercise every week. ? A total of 75 minutes of vigorous-intensity exercise every week. ? A mix of moderate-intensity and vigorous-intensity exercise every week. Children, pregnant women, people who have not exercised regularly, people who are overweight, and older adults may need to talk with a health care provider about what activities are safe to do. If you have a medical condition, be sure to talk with your health care provider before you start a new exercise program. What are some exercise ideas? Moderate-intensity exercise ideas include:  Walking 1 mile (1.6 km) in about 15  minutes.  Biking.  Hiking.  Golfing.  Dancing.  Water aerobics. Vigorous-intensity exercise ideas include:  Walking 4.5 miles (7.2 km) or more in about 1 hour.  Jogging or running 5 miles (8 km) in about 1 hour.  Biking 10 miles (16.1 km) or more in about 1 hour.  Lap swimming.  Roller-skating or in-line skating.  Cross-country skiing.  Vigorous competitive sports, such as football, basketball, and soccer.  Jumping rope.  Aerobic dancing.   What are some everyday activities that can help me to get exercise?  Yard work, such as: ? Pushing a lawn mower. ? Raking and bagging leaves.  Washing your car.  Pushing a stroller.  Shoveling snow.  Gardening.  Washing windows or floors. How can I be more active in my day-to-day activities?  Use stairs instead of an elevator.  Take a walk during your lunch break.  If you drive, park your car farther away from your work or school.  If you take public transportation, get off one stop early and walk the rest of the way.  Stand up or walk around during all of your indoor phone calls.  Get up, stretch, and walk around every 30 minutes throughout the day.  Enjoy exercise with a friend. Support to continue exercising will help you keep a regular routine of activity. What guidelines can I follow while exercising?  Before you start a new exercise program, talk with your health care provider.  Do not exercise so much that you hurt yourself, feel dizzy, or get very short of breath.  Wear comfortable clothes and wear shoes with good support.  Drink plenty of water while you exercise to prevent dehydration or heat stroke.  Work out until   your breathing and your heartbeat get faster. Where to find more information  [place] Department of Health and Human Services: www.hhs.gov  Centers for Disease Control and Prevention (CDC): www.cdc.gov Summary  Exercising regularly is important. It will improve your overall fitness,  flexibility, and endurance.  Regular exercise also will improve your overall health. It can help you control your weight, reduce stress, and improve your bone density.  Do not exercise so much that you hurt yourself, feel dizzy, or get very short of breath.  Before you start a new exercise program, talk with your health care provider. This information is not intended to replace advice given to you by your health care provider. Make sure you discuss any questions you have with your health care provider. Document Revised: 02/28/2017 Document Reviewed: 02/06/2017 Elsevier Patient Education  2021 Elsevier Inc.  

## 2020-05-02 LAB — THYROID PANEL WITH TSH
Free Thyroxine Index: 1.9 (ref 1.2–4.9)
T3 Uptake Ratio: 24 % (ref 24–39)
T4, Total: 8.1 ug/dL (ref 4.5–12.0)
TSH: 1.53 u[IU]/mL (ref 0.450–4.500)

## 2020-05-02 LAB — CBC WITH DIFFERENTIAL/PLATELET
Basophils Absolute: 0 10*3/uL (ref 0.0–0.2)
Basos: 1 %
EOS (ABSOLUTE): 0.1 10*3/uL (ref 0.0–0.4)
Eos: 2 %
Hematocrit: 45.1 % (ref 34.0–46.6)
Hemoglobin: 14.9 g/dL (ref 11.1–15.9)
Immature Grans (Abs): 0 10*3/uL (ref 0.0–0.1)
Immature Granulocytes: 0 %
Lymphocytes Absolute: 2.1 10*3/uL (ref 0.7–3.1)
Lymphs: 34 %
MCH: 30.7 pg (ref 26.6–33.0)
MCHC: 33 g/dL (ref 31.5–35.7)
MCV: 93 fL (ref 79–97)
Monocytes Absolute: 0.4 10*3/uL (ref 0.1–0.9)
Monocytes: 6 %
Neutrophils Absolute: 3.5 10*3/uL (ref 1.4–7.0)
Neutrophils: 57 %
Platelets: 267 10*3/uL (ref 150–450)
RBC: 4.86 x10E6/uL (ref 3.77–5.28)
RDW: 12.5 % (ref 11.7–15.4)
WBC: 6.2 10*3/uL (ref 3.4–10.8)

## 2020-05-02 LAB — CMP14+EGFR
ALT: 22 IU/L (ref 0–32)
AST: 27 IU/L (ref 0–40)
Albumin/Globulin Ratio: 2.4 — ABNORMAL HIGH (ref 1.2–2.2)
Albumin: 4.8 g/dL (ref 3.8–4.8)
Alkaline Phosphatase: 87 IU/L (ref 44–121)
BUN/Creatinine Ratio: 17 (ref 12–28)
BUN: 11 mg/dL (ref 8–27)
Bilirubin Total: 0.9 mg/dL (ref 0.0–1.2)
CO2: 26 mmol/L (ref 20–29)
Calcium: 9.9 mg/dL (ref 8.7–10.3)
Chloride: 101 mmol/L (ref 96–106)
Creatinine, Ser: 0.65 mg/dL (ref 0.57–1.00)
GFR calc Af Amer: 107 mL/min/{1.73_m2} (ref >59)
GFR calc non Af Amer: 93 mL/min/{1.73_m2} (ref >59)
Globulin, Total: 2 g/dL (ref 1.5–4.5)
Glucose: 93 mg/dL (ref 65–99)
Potassium: 4.5 mmol/L (ref 3.5–5.2)
Sodium: 139 mmol/L (ref 134–144)
Total Protein: 6.8 g/dL (ref 6.0–8.5)

## 2020-05-02 LAB — LIPID PANEL
Chol/HDL Ratio: 3.2 ratio (ref 0.0–4.4)
Cholesterol, Total: 165 mg/dL (ref 100–199)
HDL: 52 mg/dL (ref >39)
LDL Chol Calc (NIH): 92 mg/dL (ref 0–99)
Triglycerides: 116 mg/dL (ref 0–149)
VLDL Cholesterol Cal: 21 mg/dL (ref 5–40)

## 2020-10-20 ENCOUNTER — Encounter: Payer: Self-pay | Admitting: Nurse Practitioner

## 2020-10-20 ENCOUNTER — Telehealth: Payer: Self-pay | Admitting: Nurse Practitioner

## 2020-10-20 ENCOUNTER — Other Ambulatory Visit: Payer: Self-pay | Admitting: Nurse Practitioner

## 2020-10-20 ENCOUNTER — Ambulatory Visit (INDEPENDENT_AMBULATORY_CARE_PROVIDER_SITE_OTHER): Payer: Medicare Other | Admitting: Nurse Practitioner

## 2020-10-20 DIAGNOSIS — U071 COVID-19: Secondary | ICD-10-CM | POA: Diagnosis not present

## 2020-10-20 MED ORDER — NIRMATRELVIR/RITONAVIR (PAXLOVID)TABLET: 3.0000 | ORAL_TABLET | Freq: Two times a day (BID) | ORAL | 0 refills | Status: AC

## 2020-10-20 MED ORDER — MOLNUPIRAVIR EUA 200MG CAPSULE: 4.0000 | ORAL_CAPSULE | Freq: Two times a day (BID) | ORAL | 0 refills | Status: AC

## 2020-10-20 NOTE — Progress Notes (Signed)
Covd positive

## 2020-10-20 NOTE — Assessment & Plan Note (Signed)
Patient has home COVID-19 test positive.  In the last 24 hours, symptoms of cough, fever of 102, body aches and fatigue, chills, and sore throat.  Advised patient to increase hydration, Tylenol for headache,Paxlovid antiviral for 5 days.  Monitor oxygen saturations, patient verbalized understanding.

## 2020-10-20 NOTE — Progress Notes (Signed)
   Virtual Visit  Note Due to COVID-19 pandemic this visit was conducted virtually. This visit type was conducted due to national recommendations for restrictions regarding the COVID-19 Pandemic (e.g. social distancing, sheltering in place) in an effort to limit this patient's exposure and mitigate transmission in our community. All issues noted in this document were discussed and addressed.  A physical exam was not performed with this format.  I connected with Nashayla [place] on 10/20/20 at 11:46 am by telephone and verified that I am speaking with the correct person using two identifiers. Chevella Rubi Greenblatt is currently located at home during visit. The provider, Ivy Lynn, NP is located in their office at time of visit.  I discussed the limitations, risks, security and privacy concerns of performing an evaluation and management service by telephone and the availability of in person appointments. I also discussed with the patient that there may be a patient responsible charge related to this service. The patient expressed understanding and agreed to proceed.   History and Present Illness:  HPI  Patients at home COVID-19 test positive in the last 24 hours.  Symptoms of chills, fever 102, body aches and fatigue, cough and sore throat.  Patient has used nothing for symptoms.  No nausea vomiting or rash associated with symptoms.  Review of Systems  Constitutional:  Positive for chills, fever and malaise/fatigue.  HENT:  Positive for sore throat. Negative for congestion and sinus pain.   Respiratory:  Positive for cough.   Skin:  Negative for itching and rash.  All other systems reviewed and are negative.   Observations/Objective: Televisit patient not in distress.  Assessment and Plan: Patient has home COVID-19 test positive.  In the last 24 hours, symptoms of cough, fever of 102, body aches and fatigue, chills, and sore throat.  Advised patient to increase hydration, Tylenol for  headache,Paxlovid antiviral for 5 days.  Monitor oxygen saturations, patient verbalized understanding.  Follow Up Instructions: Follow-up with worsening unresolved symptoms.    I discussed the assessment and treatment plan with the patient. The patient was provided an opportunity to ask questions and all were answered. The patient agreed with the plan and demonstrated an understanding of the instructions.   The patient was advised to call back or seek an in-person evaluation if the symptoms worsen or if the condition fails to improve as anticipated.  The above assessment and management plan was discussed with the patient. The patient verbalized understanding of and has agreed to the management plan. Patient is aware to call the clinic if symptoms persist or worsen. Patient is aware when to return to the clinic for a follow-up visit. Patient educated on when it is appropriate to go to the emergency department.   Time call ended: 11:52 am    I provided 6 minutes of  non face-to-face time during this encounter.    Ivy Lynn, NP

## 2020-10-26 ENCOUNTER — Ambulatory Visit: Payer: Self-pay | Admitting: Nurse Practitioner

## 2020-10-26 ENCOUNTER — Encounter: Payer: Self-pay | Admitting: Nurse Practitioner

## 2020-10-26 ENCOUNTER — Ambulatory Visit (INDEPENDENT_AMBULATORY_CARE_PROVIDER_SITE_OTHER): Payer: Medicare Other | Admitting: Nurse Practitioner

## 2020-10-26 DIAGNOSIS — E034 Atrophy of thyroid (acquired): Secondary | ICD-10-CM

## 2020-10-26 DIAGNOSIS — M4316 Spondylolisthesis, lumbar region: Secondary | ICD-10-CM | POA: Diagnosis not present

## 2020-10-26 DIAGNOSIS — E782 Mixed hyperlipidemia: Secondary | ICD-10-CM

## 2020-10-26 DIAGNOSIS — Z6831 Body mass index (BMI) 31.0-31.9, adult: Secondary | ICD-10-CM

## 2020-10-26 DIAGNOSIS — D420 Neoplasm of uncertain behavior of cerebral meninges: Secondary | ICD-10-CM

## 2020-10-26 MED ORDER — LEVOTHYROXINE SODIUM 50 MCG PO TABS
50.0000 ug | ORAL_TABLET | Freq: Every day | ORAL | 1 refills | Status: DC
Start: 2020-10-26 — End: 2021-02-12

## 2020-10-26 MED ORDER — ACETAMINOPHEN-CODEINE #3 300-30 MG PO TABS: 1.0000 | ORAL_TABLET | Freq: Four times a day (QID) | ORAL | 2 refills | Status: DC | PRN

## 2020-10-26 MED ORDER — ROSUVASTATIN CALCIUM 10 MG PO TABS: 10.0000 mg | ORAL_TABLET | Freq: Every day | ORAL | 1 refills | Status: DC

## 2020-10-26 NOTE — Progress Notes (Signed)
Subjective:    Patient ID: Julia Poole, female    DOB: 1955-11-06, 65 y.o.   MRN: [id number]   Chief Complaint: medical management of chronic issues     HPI:  1. Hypothyroidism due to acquired atrophy of thyroid No problems that she is aware of. Lab Results  Component Value Date   TSH 1.530 05/01/2020     2. Mixed hyperlipidemia Does not really watch diet and does no dedicated exercise.  Lab Results  Component Value Date   CHOL 165 05/01/2020   HDL 52 05/01/2020   LDLCALC 92 05/01/2020   TRIG 116 05/01/2020   CHOLHDL 3.2 05/01/2020     3. Atypical meningioma of brain ([place]) This first appeared in 2017. Patient had brain surgery. Has no residual effects and has had no recent occurences. Denies any headaches, dizziness or blurred vision.  4. Spondylolisthesis of lumbar region Pain assessment: Cause of pain- spondylolisthesis Pain location- limbar pine Pain on scale of 1-10- 4/10 Frequency- daily What increases pain-nothing really What makes pain Better-rest helps Effects on ADL - none Any change in general medical condition-no  Current opioids rx- tylenol #3 up to 4x a day # meds rx- 120 Effectiveness of current meds-helps Adverse reactions from pain meds-none Morphine equivalent- 18MME  Pill count performed-No Last drug screen - 01/27/20 ( high risk q66m moderate risk q638mlow risk yearly ) Urine drug screen today- No Was [place] [facility]- yes  If yes were their any concerning findings? - no   Overdose risk: 1 Opioid Risk  10/14/2018  Alcohol 0  Illegal Drugs 0  Rx Drugs 0  Alcohol 0  Illegal Drugs 0  Rx Drugs 0  Age between 16-45 years  0  History of Preadolescent Sexual Abuse 0  Psychological Disease 0  Depression 0  Opioid Risk Tool Scoring 0  Opioid Risk Interpretation Low Risk     Pain contract signed on:02/01/20   5. BMI 31.0-31.9,adult No recent weight changes    Outpatient Encounter Medications as of 10/26/2020  Medication  Sig   acetaminophen-codeine (TYLENOL #3) 300-30 MG tablet Take 1 tablet by mouth every 6 (six) hours as needed for moderate pain.   Ascorbic Acid (VITAMIN C) 500 MG tablet Take 500 mg by mouth daily.    aspirin 81 MG tablet Take 81 mg by mouth daily.     Biotin 10 MG CAPS Take by mouth.   Cholecalciferol (VITAMIN D) 2000 UNITS tablet Take 2,000 Units by mouth 2 (two) times daily.   COCONUT OIL PO Take by mouth.   cyanocobalamin 100 MCG tablet Take 100 mcg by mouth daily.   FLAXSEED, LINSEED, PO Take by mouth.   fluticasone (FLONASE) 50 MCG/ACT nasal spray Place 2 sprays into both nostrils daily. (Patient taking differently: Place 2 sprays into both nostrils daily as needed. )   Glucosamine-Chondroitin (OSTEO BI-FLEX REGULAR STRENGTH PO) Take by mouth.   Inulin (FIBER CHOICE) 1.5 g CHEW Chew by mouth.   levothyroxine (SYNTHROID) 50 MCG tablet Take 1 tablet (50 mcg total) by mouth daily.   Multiple Vitamins-Minerals (CENTRUM SILVER PO) Take by mouth.     rosuvastatin (CRESTOR) 10 MG tablet Take 1 tablet (10 mg total) by mouth daily.   TURMERIC PO Take by mouth.   vitamin E (VITAMIN E) 400 UNIT capsule Take 400 Units by mouth daily.   Facility-Administered Encounter Medications as of 10/26/2020  Medication   0.9 %  sodium chloride infusion    Past Surgical History:  Procedure Laterality Date   BRAIN SURGERY     BREAST SURGERY  AUG. 2010   right breast lumpectomy   BREAST SURGERY     Cancer   COLONOSCOPY     CRANIOTOMY Left 10/16/2015   Procedure: PARA-SAGITTAL CRANIOTOMY WITH STEREOTACTIC NAVIGATION FOR TUMOR;  Surgeon: Kevan Ny Ditty, MD;  Location: [facility];  Service: Neurosurgery;  Laterality: Left;   GANGLION CYST EXCISION  SEP. 1997   RIGHT WRIST    Family History  Problem Relation Age of Onset   Colon cancer Mother 14   Diabetes Father    Heart disease Father    Cancer Father        skin   Breast cancer Brother 18   Cancer Brother        bone mets   Leukemia  Sister 34       CLL   Cancer Brother        skin   Ovarian cancer Maternal Aunt        dx in her 72s   Tuberculosis Maternal Grandmother    Heart disease Maternal Grandfather    Diabetes Paternal Grandmother    Alcohol abuse Paternal Grandfather    Esophageal cancer Neg Hx    Rectal cancer Neg Hx    Stomach cancer Neg Hx     New complaints:  Social history:  Controlled substance contract:     Review of Systems     Objective:   Physical Exam        Assessment & Plan:

## 2020-10-26 NOTE — Progress Notes (Signed)
Virtual Visit  Note Due to COVID-19 pandemic this visit was conducted virtually. This visit type was conducted due to national recommendations for restrictions regarding the COVID-19 Pandemic (e.g. social distancing, sheltering in place) in an effort to limit this patient's exposure and mitigate transmission in our community. All issues noted in this document were discussed and addressed.  A physical exam was not performed with this format.  I connected with Julia Poole on 10/26/20 at 3:38 by telephone and verified that I am speaking with the correct person using two identifiers. Julia Poole is currently located at home and her husband is currently with her during visit. The provider, [name] Done, FNP is located in their office at time of visit.  I discussed the limitations, risks, security and privacy concerns of performing an evaluation and management service by telephone and the availability of in person appointments. I also discussed with the patient that there may be a patient responsible charge related to this service. The patient expressed understanding and agreed to proceed.   History and Present Illness:    Subjective:    Patient ID: Julia Poole, female    DOB: Nov 13, 1955, 65 y.o.   MRN: [id number]   Chief Complaint: medical management of chronic issues     HPI:  1. Hypothyroidism due to acquired atrophy of thyroid No problems that she is aware of. Lab Results  Component Value Date   TSH 1.530 05/01/2020     2. Mixed hyperlipidemia Does not really watch diet and does no dedicated exercise.  Lab Results  Component Value Date   CHOL 165 05/01/2020   HDL 52 05/01/2020   LDLCALC 92 05/01/2020   TRIG 116 05/01/2020   CHOLHDL 3.2 05/01/2020     3. Atypical meningioma of brain ([place]) This first appeared in 2017. Patient had brain surgery. Has no residual effects and has had no recent occurences. Denies any headaches, dizziness or blurred vision.  4.  Spondylolisthesis of lumbar region Pain assessment: Cause of pain- spondylolisthesis Pain location- limbar pine Pain on scale of 1-10- 4/10 Frequency- daily What increases pain-nothing really What makes pain Better-rest helps Effects on ADL - none Any change in general medical condition-no  Current opioids rx- tylenol #3 up to 4x a day # meds rx- 120 Effectiveness of current meds-helps Adverse reactions from pain meds-none Morphine equivalent- 18MME  Pill count performed-No Last drug screen - 01/27/20 ( high risk q35m moderate risk q670mlow risk yearly ) Urine drug screen today- No [place] yes  If yes were their any concerning findings? - no   Overdose risk: 1 Opioid Risk  10/14/2018  Alcohol 0  Illegal Drugs 0  Rx Drugs 0  Alcohol 0  Illegal Drugs 0  Rx Drugs 0  Age between 16-45 years  0  History of Preadolescent Sexual Abuse 0  Psychological Disease 0  Depression 0  Opioid Risk Tool Scoring 0  Opioid Risk Interpretation Low Risk     Pain contract signed on:02/01/20   5. BMI 33.0-33.9,adult No recent weight changes Wt Readings from Last 3 Encounters:  05/01/20 195 lb (88.5 kg)  01/27/20 193 lb (87.5 kg)  07/30/19 195 lb 8 oz (88.7 kg)   BMI Readings from Last 3 Encounters:  05/01/20 33.47 kg/m  01/27/20 33.13 kg/m  07/30/19 33.56 kg/m      Outpatient Encounter Medications as of 10/26/2020  Medication Sig   acetaminophen-codeine (TYLENOL #3) 300-30 MG tablet Take 1 tablet by mouth  every 6 (six) hours as needed for moderate pain.   Ascorbic Acid (VITAMIN C) 500 MG tablet Take 500 mg by mouth daily.    aspirin 81 MG tablet Take 81 mg by mouth daily.     Biotin 10 MG CAPS Take by mouth.   Cholecalciferol (VITAMIN D) 2000 UNITS tablet Take 2,000 Units by mouth 2 (two) times daily.   COCONUT OIL PO Take by mouth.   cyanocobalamin 100 MCG tablet Take 100 mcg by mouth daily.   FLAXSEED, LINSEED, PO Take by mouth.   fluticasone (FLONASE)  50 MCG/ACT nasal spray Place 2 sprays into both nostrils daily. (Patient taking differently: Place 2 sprays into both nostrils daily as needed. )   Glucosamine-Chondroitin (OSTEO BI-FLEX REGULAR STRENGTH PO) Take by mouth.   Inulin (FIBER CHOICE) 1.5 g CHEW Chew by mouth.   levothyroxine (SYNTHROID) 50 MCG tablet Take 1 tablet (50 mcg total) by mouth daily.   Multiple Vitamins-Minerals (CENTRUM SILVER PO) Take by mouth.     rosuvastatin (CRESTOR) 10 MG tablet Take 1 tablet (10 mg total) by mouth daily.   TURMERIC PO Take by mouth.   vitamin E (VITAMIN E) 400 UNIT capsule Take 400 Units by mouth daily.   Facility-Administered Encounter Medications as of 10/26/2020  Medication   0.9 %  sodium chloride infusion    Past Surgical History:  Procedure Laterality Date   BRAIN SURGERY     BREAST SURGERY  AUG. 2010   right breast lumpectomy   BREAST SURGERY     Cancer   COLONOSCOPY     CRANIOTOMY Left 10/16/2015   Procedure: PARA-SAGITTAL CRANIOTOMY WITH STEREOTACTIC NAVIGATION FOR TUMOR;  Surgeon: Kevan Ny Ditty, MD;  Location: [facility];  Service: Neurosurgery;  Laterality: Left;   GANGLION CYST EXCISION  SEP. 1997   RIGHT WRIST    Family History  Problem Relation Age of Onset   Colon cancer Mother 32   Diabetes Father    Heart disease Father    Cancer Father        skin   Breast cancer Brother 6   Cancer Brother        bone mets   Leukemia Sister 18       CLL   Cancer Brother        skin   Ovarian cancer Maternal Aunt        dx in her 25s   Tuberculosis Maternal Grandmother    Heart disease Maternal Grandfather    Diabetes Paternal Grandmother    Alcohol abuse Paternal Grandfather    Esophageal cancer Neg Hx    Rectal cancer Neg Hx    Stomach cancer Neg Hx     New complaints: Had covid last week. So was not able to comei n for visit  Social history: Lives with husband  Controlled substance contract: n/a    Review of Systems  Constitutional:  Negative  for diaphoresis and weight loss.  Eyes:  Negative for blurred vision, double vision and pain.  Respiratory:  Negative for shortness of breath.   Cardiovascular:  Negative for chest pain, palpitations, orthopnea and leg swelling.  Gastrointestinal:  Negative for abdominal pain.  Skin:  Negative for rash.  Neurological:  Negative for dizziness, sensory change, loss of consciousness, weakness and headaches.  Endo/Heme/Allergies:  Negative for polydipsia. Does not bruise/bleed easily.  Psychiatric/Behavioral:  Negative for memory loss. The patient does not have insomnia.   All other systems reviewed and are negative.   Observations/Objective:  Alert and oriented- answers all questions appropriately No distress Voice still hoarse  Assessment and Plan: Julia Poole comes in today with chief complaint of No chief complaint on file.   Diagnosis and orders addressed:  1. Hypothyroidism due to acquired atrophy of thyroid Labs pending - levothyroxine (SYNTHROID) 50 MCG tablet; Take 1 tablet (50 mcg total) by mouth daily.  Dispense: 90 tablet; Refill: 1 - Thyroid Panel With TSH; Future  2. Mixed hyperlipidemia Low fat diet - rosuvastatin (CRESTOR) 10 MG tablet; Take 1 tablet (10 mg total) by mouth daily.  Dispense: 90 tablet; Refill: 1 - CBC with Differential/Platelet; Future - CMP14+EGFR; Future - Lipid panel; Future  3. Atypical meningioma of brain [facility]) Keep follow up with neurologist yearly  4. Spondylolisthesis of lumbar region Continue paon meds Stretch back daily - acetaminophen-codeine (TYLENOL #3) 300-30 MG tablet; Take 1 tablet by mouth every 6 (six) hours as needed for moderate pain.  Dispense: 120 tablet; Refill: 2  5. BMI 31.0-31.9,adult Discussed diet and exercise for person with BMI >25 Will recheck weight in 3-6 months     Labs pending- patient will come in and have drawn when over covid Health Maintenance reviewed Diet and exercise encouraged  Follow up  plan: 6 months     I discussed the assessment and treatment plan with the patient. The patient was provided an opportunity to ask questions and all were answered. The patient agreed with the plan and demonstrated an understanding of the instructions.   The patient was advised to call back or seek an in-person evaluation if the symptoms worsen or if the condition fails to improve as anticipated.  The above assessment and management plan was discussed with the patient. The patient verbalized understanding of and has agreed to the management plan. Patient is aware to call the clinic if symptoms persist or worsen. Patient is aware when to return to the clinic for a follow-up visit. Patient educated on when it is appropriate to go to the emergency department.   Time call ended:  8:56  I provided 18 minutes of  non face-to-face time during this encounter.    [place], FNP  v

## 2020-11-02 ENCOUNTER — Ambulatory Visit (INDEPENDENT_AMBULATORY_CARE_PROVIDER_SITE_OTHER): Payer: Medicare Other | Admitting: Family Medicine

## 2020-11-02 ENCOUNTER — Encounter: Payer: Self-pay | Admitting: Family Medicine

## 2020-11-02 DIAGNOSIS — R059 Cough, unspecified: Secondary | ICD-10-CM

## 2020-11-02 MED ORDER — PREDNISONE 10 MG (21) PO TBPK: ORAL_TABLET | ORAL | 0 refills | Status: DC

## 2020-11-02 MED ORDER — DM-GUAIFENESIN ER 30-600 MG PO TB12: 1.0000 | ORAL_TABLET | Freq: Two times a day (BID) | ORAL | 0 refills | Status: DC

## 2020-11-02 NOTE — Progress Notes (Signed)
Virtual Visit via telephone Note Due to COVID-19 pandemic this visit was conducted virtually. This visit type was conducted due to national recommendations for restrictions regarding the COVID-19 Pandemic (e.g. social distancing, sheltering in place) in an effort to limit this patient's exposure and mitigate transmission in our community. All issues noted in this document were discussed and addressed.  A physical exam was not performed with this format.   I connected with Julia Poole on 11/02/2020 by telephone and verified that I am speaking with the correct person using two identifiers. Julia Poole is currently located at home and  spouse  is currently with them during visit. The provider, Monia Pouch, FNP is located in their office at time of visit.  I discussed the limitations, risks, security and privacy concerns of performing an evaluation and management service by telephone and the availability of in person appointments. I also discussed with the patient that there may be a patient responsible charge related to this service. The patient expressed understanding and agreed to proceed.  Subjective:  Patient ID: Julia Poole, female    DOB: [birth year], 65 y.o.   MRN: [id number]  Chief Complaint:  Cough   HPI: Julia Poole is a 65 y.o. female presenting on 11/02/2020 for Cough   Pt states she had COVID-19 last month and has recovered well but continues to have a nonproductive cough.   Cough This is a new problem. The current episode started 1 to 4 weeks ago. The problem has been waxing and waning. The problem occurs every few minutes. The cough is Non-productive. Pertinent negatives include no chest pain, chills, ear congestion, ear pain, fever, headaches, heartburn, hemoptysis, myalgias, nasal congestion, postnasal drip, rash, rhinorrhea, sore throat, shortness of breath, sweats, weight loss or wheezing. Nothing aggravates the symptoms. She has tried cool air and OTC cough suppressant  (flonase) for the symptoms. The treatment provided mild relief. There is no history of asthma, COPD or pneumonia.    Relevant past medical, surgical, family, and social history reviewed and updated as indicated.  Allergies and medications reviewed and updated.   Past Medical History:  Diagnosis Date   Allergy    Arthritis    Bleeding nose    Cancer ([place]) 2010   right breast   Cancer ([place]) 09/2015   brain   Cataract    Degenerative disk disease 2012   Family history of breast cancer in female    Family history of colon cancer    Family history of ovarian cancer    Hemorrhoids    Hyperlipidemia    Sinus problem    Thyroid disease     Past Surgical History:  Procedure Laterality Date   BRAIN SURGERY     BREAST SURGERY  AUG. 2010   right breast lumpectomy   BREAST SURGERY     Cancer   COLONOSCOPY     CRANIOTOMY Left 10/16/2015   Procedure: PARA-SAGITTAL CRANIOTOMY WITH STEREOTACTIC NAVIGATION FOR TUMOR;  Surgeon: Kevan Ny Ditty, MD;  Location: [facility];  Service: Neurosurgery;  Laterality: Left;   GANGLION CYST EXCISION  SEP. 1997   RIGHT WRIST    Social History   Socioeconomic History   Marital status: Married    Spouse name: Not on file   Number of children: 1   Years of education: 12   Highest education level: 12th grade  Occupational History   Occupation: Librarian, academic. [employer]  Tobacco Use   Smoking status: Never   Smokeless tobacco:  Never  Vaping Use   Vaping Use: Never used  Substance and Sexual Activity   Alcohol use: No   Drug use: No   Sexual activity: Yes    Birth control/protection: Post-menopausal  Other Topics Concern   Not on file  Social History Narrative   Not on file   Social Determinants of Health   Financial Resource Strain: Not on file  Food Insecurity: Not on file  Transportation Needs: Not on file  Physical Activity: Not on file  Stress: Not on file  Social Connections: Not on file  Intimate Partner Violence: Not on file     Outpatient Encounter Medications as of 11/02/2020  Medication Sig   dextromethorphan-guaiFENesin (MUCINEX DM) 30-600 MG 12hr tablet Take 1 tablet by mouth 2 (two) times daily.   predniSONE (STERAPRED UNI-PAK 21 TAB) 10 MG (21) TBPK tablet As directed x 6 days   acetaminophen-codeine (TYLENOL #3) 300-30 MG tablet Take 1 tablet by mouth every 6 (six) hours as needed for moderate pain.   Ascorbic Acid (VITAMIN C) 500 MG tablet Take 500 mg by mouth daily.    aspirin 81 MG tablet Take 81 mg by mouth daily.     Biotin 10 MG CAPS Take by mouth.   Cholecalciferol (VITAMIN D) 2000 UNITS tablet Take 2,000 Units by mouth 2 (two) times daily.   COCONUT OIL PO Take by mouth.   cyanocobalamin 100 MCG tablet Take 100 mcg by mouth daily.   FLAXSEED, LINSEED, PO Take by mouth.   fluticasone (FLONASE) 50 MCG/ACT nasal spray Place 2 sprays into both nostrils daily. (Patient taking differently: Place 2 sprays into both nostrils daily as needed. )   Glucosamine-Chondroitin (OSTEO BI-FLEX REGULAR STRENGTH PO) Take by mouth.   Inulin (FIBER CHOICE) 1.5 g CHEW Chew by mouth.   levothyroxine (SYNTHROID) 50 MCG tablet Take 1 tablet (50 mcg total) by mouth daily.   Multiple Vitamins-Minerals (CENTRUM SILVER PO) Take by mouth.     rosuvastatin (CRESTOR) 10 MG tablet Take 1 tablet (10 mg total) by mouth daily.   TURMERIC PO Take by mouth.   vitamin E (VITAMIN E) 400 UNIT capsule Take 400 Units by mouth daily.   Facility-Administered Encounter Medications as of 11/02/2020  Medication   0.9 %  sodium chloride infusion    Allergies  Allergen Reactions   Norco [Hydrocodone-Acetaminophen] Nausea Only    Review of Systems  Constitutional:  Negative for activity change, appetite change, chills, diaphoresis, fatigue, fever, unexpected weight change and weight loss.  HENT:  Negative for congestion, ear pain, postnasal drip, rhinorrhea, sinus pressure, sinus pain, sneezing and sore throat.   Respiratory:  Positive for  cough. Negative for apnea, hemoptysis, chest tightness, shortness of breath, wheezing and stridor.   Cardiovascular:  Negative for chest pain.  Gastrointestinal:  Negative for heartburn.  Musculoskeletal:  Negative for myalgias.  Skin:  Negative for rash.  Neurological:  Negative for headaches.  All other systems reviewed and are negative.       Observations/Objective: No vital signs or physical exam, this was a telephone or virtual health encounter.  Pt alert and oriented, answers all questions appropriately, and able to speak in full sentences.    Assessment and Plan: Lakeitha was seen today for cough.  Diagnoses and all orders for this visit:  Cough Continued cough post COVID-19. No indications of bacterial infection per pts reported symptoms. Symptomatic care discussed in detail. Will burst with steroids. Mucinex as prescribed with plenty of water.  -  dextromethorphan-guaiFENesin (MUCINEX DM) 30-600 MG 12hr tablet; Take 1 tablet by mouth 2 (two) times daily. -     predniSONE (STERAPRED UNI-PAK 21 TAB) 10 MG (21) TBPK tablet; As directed x 6 days    Follow Up Instructions: Return in about 4 weeks (around 11/30/2020), or if symptoms worsen or fail to improve.    I discussed the assessment and treatment plan with the patient. The patient was provided an opportunity to ask questions and all were answered. The patient agreed with the plan and demonstrated an understanding of the instructions.   The patient was advised to call back or seek an in-person evaluation if the symptoms worsen or if the condition fails to improve as anticipated.  The above assessment and management plan was discussed with the patient. The patient verbalized understanding of and has agreed to the management plan. Patient is aware to call the clinic if they develop any new symptoms or if symptoms persist or worsen. Patient is aware when to return to the clinic for a follow-up visit. Patient educated on when  it is appropriate to go to the emergency department.    I provided 14 minutes of non-face-to-face time during this encounter. The call started at 0911. The call ended at 0925. The other time was used for coordination of care.    Monia Pouch, FNP-C [facility] [address] [place], [place] [address] [phone] 11/02/2020

## 2020-12-18 ENCOUNTER — Telehealth: Payer: Self-pay | Admitting: Nurse Practitioner

## 2020-12-18 NOTE — Telephone Encounter (Signed)
Patient needs appointment scheduled for Shingrix vaccine.  Appointment scheduled 12/21/20 at 10:30 am.

## 2020-12-18 NOTE — Telephone Encounter (Signed)
Pt calling to talk to nurse about getting a shingles shot.

## 2020-12-21 ENCOUNTER — Ambulatory Visit: Payer: Medicare Other

## 2020-12-21 ENCOUNTER — Other Ambulatory Visit: Payer: Self-pay

## 2020-12-26 DIAGNOSIS — R921 Mammographic calcification found on diagnostic imaging of breast: Secondary | ICD-10-CM | POA: Diagnosis not present

## 2020-12-26 DIAGNOSIS — Z853 Personal history of malignant neoplasm of breast: Secondary | ICD-10-CM | POA: Diagnosis not present

## 2020-12-26 LAB — HM MAMMOGRAPHY

## 2021-02-08 ENCOUNTER — Ambulatory Visit (INDEPENDENT_AMBULATORY_CARE_PROVIDER_SITE_OTHER): Payer: Medicare Other

## 2021-02-08 ENCOUNTER — Other Ambulatory Visit: Payer: Self-pay

## 2021-02-08 DIAGNOSIS — Z23 Encounter for immunization: Secondary | ICD-10-CM | POA: Diagnosis not present

## 2021-02-12 ENCOUNTER — Telehealth: Payer: Self-pay | Admitting: Nurse Practitioner

## 2021-02-12 DIAGNOSIS — E034 Atrophy of thyroid (acquired): Secondary | ICD-10-CM

## 2021-02-12 DIAGNOSIS — M4316 Spondylolisthesis, lumbar region: Secondary | ICD-10-CM

## 2021-02-12 DIAGNOSIS — E782 Mixed hyperlipidemia: Secondary | ICD-10-CM

## 2021-02-12 MED ORDER — ACETAMINOPHEN-CODEINE #3 300-30 MG PO TABS: 1.0000 | ORAL_TABLET | Freq: Four times a day (QID) | ORAL | 1 refills | Status: DC | PRN

## 2021-02-12 MED ORDER — LEVOTHYROXINE SODIUM 50 MCG PO TABS
50.0000 ug | ORAL_TABLET | Freq: Every day | ORAL | 1 refills | Status: DC
Start: 2021-02-12 — End: 2021-04-30

## 2021-02-12 MED ORDER — ROSUVASTATIN CALCIUM 10 MG PO TABS: 10.0000 mg | ORAL_TABLET | Freq: Every day | ORAL | 1 refills | Status: DC

## 2021-02-12 NOTE — Telephone Encounter (Signed)
Resent tylenol with codeine rx to pharmacy because there was a mix up with prescriptions.

## 2021-02-14 ENCOUNTER — Ambulatory Visit (INDEPENDENT_AMBULATORY_CARE_PROVIDER_SITE_OTHER): Payer: Medicare Other

## 2021-02-14 VITALS — Ht 65.0 in | Wt 195.0 lb

## 2021-02-14 DIAGNOSIS — Z0000 Encounter for general adult medical examination without abnormal findings: Secondary | ICD-10-CM

## 2021-02-14 DIAGNOSIS — Z780 Asymptomatic menopausal state: Secondary | ICD-10-CM

## 2021-02-14 NOTE — Progress Notes (Signed)
Subjective:   Julia Poole is a 65 y.o. female who presents for Medicare Annual (Subsequent) preventive examination. Virtual Visit via Telephone Note  I connected with  Julia Poole on 02/14/21 at 10:30 AM EST by telephone and verified that I am speaking with the correct person using two identifiers.  Location: Patient: Home Provider: [facility] Persons participating in the virtual visit: patient/Nurse Health Advisor   I discussed the limitations, risks, security and privacy concerns of performing an evaluation and management service by telephone and the availability of in person appointments. The patient expressed understanding and agreed to proceed.  Interactive audio and video telecommunications were attempted between this nurse and patient, however failed, due to patient having technical difficulties OR patient did not have access to video capability.  We continued and completed visit with audio only.  Some vital signs may be absent or patient reported.   Julia Driver, LPN  Review of Systems     Cardiac Risk Factors include: advanced age (>71men, >50 women);dyslipidemia;obesity (BMI >30kg/m2);sedentary lifestyle     Objective:    Today's Vitals   02/14/21 1019  Weight: 195 lb (88.5 kg)  Height: 5\' 5"  (1.651 m)   Body mass index is 32.45 kg/m.  Advanced Directives 02/14/2021 01/24/2020 01/20/2019 09/25/2017 08/28/2016 08/09/2016 07/26/2016  Does Patient Have a Medical Advance Directive? No No No No No No No  Would patient like information on creating a medical advance directive? No - Patient declined No - Patient declined No - Patient declined No - Patient declined Yes (MAU/Ambulatory/Procedural Areas - Information given) - -    Current Medications (verified) Outpatient Encounter Medications as of 02/14/2021  Medication Sig   acetaminophen-codeine (TYLENOL #3) 300-30 MG tablet Take 1 tablet by mouth every 6 (six) hours as needed for moderate pain.   Ascorbic Acid  (VITAMIN C) 500 MG tablet Take 500 mg by mouth daily.    aspirin 81 MG tablet Take 81 mg by mouth daily.     Biotin 10 MG CAPS Take by mouth.   Cholecalciferol (VITAMIN D) 2000 UNITS tablet Take 2,000 Units by mouth 2 (two) times daily.   COCONUT OIL PO Take by mouth.   cyanocobalamin 100 MCG tablet Take 100 mcg by mouth daily.   dextromethorphan-guaiFENesin (MUCINEX DM) 30-600 MG 12hr tablet Take 1 tablet by mouth 2 (two) times daily.   FLAXSEED, LINSEED, PO Take by mouth.   fluticasone (FLONASE) 50 MCG/ACT nasal spray Place 2 sprays into both nostrils daily. (Patient taking differently: Place 2 sprays into both nostrils daily as needed.)   Glucosamine-Chondroitin (OSTEO BI-FLEX REGULAR STRENGTH PO) Take by mouth.   Inulin (FIBER CHOICE) 1.5 g CHEW Chew by mouth.   levothyroxine (SYNTHROID) 50 MCG tablet Take 1 tablet (50 mcg total) by mouth daily.   Multiple Vitamins-Minerals (CENTRUM SILVER PO) Take by mouth.     rosuvastatin (CRESTOR) 10 MG tablet Take 1 tablet (10 mg total) by mouth daily.   TURMERIC PO Take by mouth.   vitamin E 180 MG (400 UNITS) capsule Take 400 Units by mouth daily.   predniSONE (STERAPRED UNI-PAK 21 TAB) 10 MG (21) TBPK tablet As directed x 6 days (Patient not taking: Reported on 02/14/2021)   Facility-Administered Encounter Medications as of 02/14/2021  Medication   0.9 %  sodium chloride infusion    Allergies (verified) Norco [hydrocodone-acetaminophen]   History: Past Medical History:  Diagnosis Date   Allergy    Arthritis    Bleeding nose  Cancer [facility]) 2010   right breast   Cancer ([place]) 09/2015   brain   Cataract    Degenerative disk disease 2012   Family history of breast cancer in female    Family history of colon cancer    Family history of ovarian cancer    Hemorrhoids    Hyperlipidemia    Sinus problem    Thyroid disease    Past Surgical History:  Procedure Laterality Date   BRAIN SURGERY     BREAST SURGERY  AUG. 2010   right  breast lumpectomy   BREAST SURGERY     Cancer   COLONOSCOPY     CRANIOTOMY Left 10/16/2015   Procedure: PARA-SAGITTAL CRANIOTOMY WITH STEREOTACTIC NAVIGATION FOR TUMOR;  Surgeon: Julia Ny Ditty, MD;  Location: [place] [facility];  Service: Neurosurgery;  Laterality: Left;   GANGLION CYST EXCISION  SEP. 1997   RIGHT WRIST   Family History  Problem Relation Age of Onset   Colon cancer Mother 7   Diabetes Father    Heart disease Father    Cancer Father        skin   Breast cancer Brother 57   Cancer Brother        bone mets   Leukemia Sister 4       CLL   Cancer Brother        skin   Ovarian cancer Maternal Aunt        dx in her 58s   Tuberculosis Maternal Grandmother    Heart disease Maternal Grandfather    Diabetes Paternal Grandmother    Alcohol abuse Paternal Grandfather    Esophageal cancer Neg Hx    Rectal cancer Neg Hx    Stomach cancer Neg Hx    Social History   Socioeconomic History   Marital status: Married    Spouse name: Julia Poole   Number of children: 1   Years of education: 12   Highest education level: 12th grade  Occupational History   Occupation: Librarian, academic. [employer]  Tobacco Use   Smoking status: Never   Smokeless tobacco: Never  Vaping Use   Vaping Use: Never used  Substance and Sexual Activity   Alcohol use: No   Drug use: No   Sexual activity: Yes    Birth control/protection: Post-menopausal  Other Topics Concern   Not on file  Social History Narrative   Married 27 years on 01/18/2021.   1 son and 2 step children, son and daughter.   Social Determinants of Health   Financial Resource Strain: Low Risk    Difficulty of Paying Living Expenses: Not hard at all  Food Insecurity: No Food Insecurity   Worried About Charity fundraiser in the Last Year: Never true   [place] in the Last Year: Never true  Transportation Needs: No Transportation Needs   Lack of Transportation (Medical): No   Lack of Transportation (Non-Medical): No  Physical  Activity: Sufficiently Active   Days of Exercise per Week: 5 days   Minutes of Exercise per Session: 30 min  Stress: No Stress Concern Present   Feeling of Stress : Not at all  Social Connections: Socially Integrated   Frequency of Communication with Friends and Family: More than three times a week   Frequency of Social Gatherings with Friends and Family: More than three times a week   Attends Religious Services: 1 to 4 times per year   Active Member of [employer] or Organizations: Yes  Attends Archivist Meetings: 1 to 4 times per year   Marital Status: Married    Tobacco Counseling Counseling given: Not Answered   Clinical Intake:  Pre-visit preparation completed: Yes  Pain : No/denies pain     BMI - recorded: 32.45 Nutritional Status: BMI > 30  Obese Nutritional Risks: None Diabetes: No  How often do you need to have someone help you when you read instructions, pamphlets, or other written materials from your doctor or pharmacy?: 1 - Never  Diabetic?No  Interpreter Needed?: No  Information entered by :: MJ Terrell Shimko, LPN   Activities of Daily Living In your present state of health, do you have any difficulty performing the following activities: 02/14/2021  Hearing? N  Vision? N  Difficulty concentrating or making decisions? Y  Comment Memory issues since radiation treatments for cancer.  Walking or climbing stairs? N  Dressing or bathing? N  Doing errands, shopping? N  Preparing Food and eating ? N  Using the Toilet? N  In the past six months, have you accidently leaked urine? N  Do you have problems with loss of bowel control? N  Managing your Medications? N  Managing your Finances? N  Housekeeping or managing your Housekeeping? N  Some recent data might be hidden    Patient Care Team: Chevis Pretty, FNP as PCP - General (Nurse Practitioner) Merilyn Baba, MD as Referring Physician (Internal Medicine) Consuella Lose, MD as  Consulting Physician (Neurosurgery)  Indicate any recent Medical Services you may have received from other than [facility] providers in the past year (date may be approximate).     Assessment:   This is a routine wellness examination for [place].  Hearing/Vision screen Hearing Screening - Comments:: Some hearing issues.  Vision Screening - Comments:: Glasses. [employer]. Overdue.   Dietary issues and exercise activities discussed: Current Exercise Habits: Home exercise routine, Type of exercise: walking, Time (Minutes): 30, Frequency (Times/Week): 5, Weekly Exercise (Minutes/Week): 150, Intensity: Mild, Exercise limited by: cardiac condition(s);orthopedic condition(s)   Goals Addressed             This Visit's Progress    Exercise 150 min/wk Moderate Activity   On track      Depression Screen PHQ 2/9 Scores 02/14/2021 05/01/2020 01/27/2020 01/24/2020 07/30/2019 04/16/2019 01/20/2019  PHQ - 2 Score 0 0 0 0 0 0 0    Fall Risk Fall Risk  02/14/2021 05/01/2020 01/27/2020 01/24/2020 07/30/2019  Falls in the past year? 0 0 0 0 0  Number falls in past yr: 0 - - - -  Injury with Fall? 0 - - - -  Risk for fall due to : Impaired balance/gait;Impaired mobility;Orthopedic patient - - - -  Follow up Falls prevention discussed - - Falls evaluation completed -    FALL RISK PREVENTION PERTAINING TO THE HOME:  Any stairs in or around the home? Yes  If so, are there any without handrails? No  Home free of loose throw rugs in walkways, pet beds, electrical cords, etc? Yes  Adequate lighting in your home to reduce risk of falls? Yes   ASSISTIVE DEVICES UTILIZED TO PREVENT FALLS:  Life alert? No  Use of a cane, walker or w/c?  Pt has walker, s/p back surgery but doesn't all the time.  Grab bars in the bathroom? Yes  Shower chair or bench in shower? Yes  Elevated toilet seat or a handicapped toilet? Yes   TIMED UP AND GO:  Was the test performed? No .  Phone visit.   Cognitive  Function: MMSE - Mini Mental State Exam 09/25/2017 08/28/2016  Orientation to time 4 5  Orientation to Place 5 5  Registration 3 3  Attention/ Calculation 5 5  Recall 3 3  Language- name 2 objects 2 2  Language- repeat 1 1  Language- follow 3 step command 3 3  Language- read & follow direction 1 1  Write a sentence 1 1  Copy design 1 1  Total score 29 30     6CIT Screen 02/14/2021 01/24/2020 01/20/2019  What Year? 0 points - 0 points  What month? 0 points - 0 points  What time? 0 points 0 points 0 points  Count back from 20 0 points 0 points 0 points  Months in reverse 0 points 0 points 0 points  Repeat phrase 0 points 2 points 0 points  Total Score 0 - 0    Immunizations Immunization History  Administered Date(s) Administered   Fluad Quad(high Dose 65+) 01/27/2019, 01/27/2020, 02/08/2021   Influenza,inj,Quad PF,6+ Mos 01/01/2013, 01/19/2014, 02/09/2015, 02/26/2016, 01/06/2018   Influenza-Unspecified 12/30/2013   Moderna Sars-Covid-2 Vaccination 08/16/2019, 09/13/2019, 05/02/2020   Pneumococcal Conjugate-13 01/27/2019   Pneumococcal Polysaccharide-23 01/27/2020   Zoster, Live 05/19/2015    TDAP status: Due, Education has been provided regarding the importance of this vaccine. Advised may receive this vaccine at local pharmacy or Health Dept. Aware to provide a copy of the vaccination record if obtained from local pharmacy or Health Dept. Verbalized acceptance and understanding.  Flu Vaccine status: Up to date  Pneumococcal vaccine status: Up to date  Covid-19 vaccine status: Information provided on how to obtain vaccines.   Qualifies for Shingles Vaccine? Yes   Zostavax completed No   Shingrix Completed?: No.    Education has been provided regarding the importance of this vaccine. Patient has been advised to call insurance company to determine out of pocket expense if they have not yet received this vaccine. Advised may also receive vaccine at local pharmacy or Health  Dept. Verbalized acceptance and understanding.  Screening Tests Health Maintenance  Topic Date Due   TETANUS/TDAP  Never done   Zoster Vaccines- Shingrix (1 of 2) Never done   COVID-19 Vaccine (4 - Booster for Moderna series) 06/27/2020   DEXA SCAN  04/15/2021   COLONOSCOPY (Pts 45-60yrs Insurance coverage will need to be confirmed)  08/09/2021   MAMMOGRAM  01/06/2022   Pneumonia Vaccine 74+ Years old  Completed   INFLUENZA VACCINE  Completed   Hepatitis C Screening  Completed   HPV VACCINES  Aged Out    Health Maintenance  Health Maintenance Due  Topic Date Due   TETANUS/TDAP  Never done   Zoster Vaccines- Shingrix (1 of 2) Never done   COVID-19 Vaccine (4 - Booster for Moderna series) 06/27/2020    Colorectal cancer screening: Type of screening: Colonoscopy. Completed 08/09/2016. Repeat every 5 years  Mammogram status: Completed 12/26/2020. Repeat every year  Bone Density status: Completed 04/16/2019. Results reflect: Bone density results: OSTEOPENIA. Repeat every 2 years.  Lung Cancer Screening: (Low Dose CT Chest recommended if Age 20-80 years, 30 pack-year currently smoking OR have quit w/in 15years.) does not qualify. Non smoker  Additional Screening:  Hepatitis C Screening: does qualify; Completed 02/09/2015  Vision Screening: Recommended annual ophthalmology exams for early detection of glaucoma and other disorders of the eye. Is the patient up to date with their annual eye exam?  No  Who is the provider or what is the name  of the office in which the patient attends annual eye exams? [employer] If pt is not established with a provider, would they like to be referred to a provider to establish care? No .   Dental Screening: Recommended annual dental exams for proper oral hygiene  Community Resource Referral / Chronic Care Management: CRR required this visit?  No   CCM required this visit?  No      Plan:     I have personally reviewed and noted  the following in the patient's chart:   Medical and social history Use of alcohol, tobacco or illicit drugs  Current medications and supplements including opioid prescriptions.  Functional ability and status Nutritional status Physical activity Advanced directives List of other physicians Hospitalizations, surgeries, and ER visits in previous 12 months Vitals Screenings to include cognitive, depression, and falls Referrals and appointments  In addition, I have reviewed and discussed with patient certain preventive protocols, quality metrics, and best practice recommendations. A written personalized care plan for preventive services as well as general preventive health recommendations were provided to patient.     Julia Driver, LPN   [id number]   Nurse Notes: Up to date with health maintenance. Would like Dexa scheduled at [facility], same day  as appointment with provider. Up to date on vaccines.

## 2021-02-14 NOTE — Patient Instructions (Signed)
Julia Poole , Thank you for taking time to come for your Medicare Wellness Visit. I appreciate your ongoing commitment to your health goals. Please review the following plan we discussed and let me know if I can assist you in the future.   Screening recommendations/referrals: Colonoscopy: Done 08/09/2016 Repeat in 5 years  Mammogram: Done 12/26/2020 Repeat annually  Bone Density: Done 04/16/2019 Order placed to schedule on same day as appointment as Chevis Pretty, NP  Recommended yearly ophthalmology/optometry visit for glaucoma screening and checkup Recommended yearly dental visit for hygiene and checkup  Vaccinations: Influenza vaccine: Done 02/08/2021 Repeat annually  Pneumococcal vaccine: Done 01/27/2019 and 01/27/2020 Tdap vaccine: Due Repeat in 10 years  Shingles vaccine: Shingrix discussed. Please contact your pharmacy for coverage information.     Covid-19:Done 08/16/2019, 09/13/2019 and 05/02/2020.  Advanced directives: Advance directive discussed with you today. Even though you declined this today, please call our office should you change your mind, and we can give you the proper paperwork for you to fill out.   Conditions/risks identified: Aim for 30 minutes of exercise or walking each day, drink 6-8 glasses of water and eat lots of fruits and vegetables.   Next appointment: Follow up in one year for your annual wellness visit 2023.   Preventive Care 34 Years and Older, Female Preventive care refers to lifestyle choices and visits with your health care provider that can promote health and wellness. What does preventive care include? A yearly physical exam. This is also called an annual well check. Dental exams once or twice a year. Routine eye exams. Ask your health care provider how often you should have your eyes checked. Personal lifestyle choices, including: Daily care of your teeth and gums. Regular physical activity. Eating a healthy diet. Avoiding tobacco and  drug use. Limiting alcohol use. Practicing safe sex. Taking low-dose aspirin every day. Taking vitamin and mineral supplements as recommended by your health care provider. What happens during an annual well check? The services and screenings done by your health care provider during your annual well check will depend on your age, overall health, lifestyle risk factors, and family history of disease. Counseling  Your health care provider may ask you questions about your: Alcohol use. Tobacco use. Drug use. Emotional well-being. Home and relationship well-being. Sexual activity. Eating habits. History of falls. Memory and ability to understand (cognition). Work and work Statistician. Reproductive health. Screening  You may have the following tests or measurements: Height, weight, and BMI. Blood pressure. Lipid and cholesterol levels. These may be checked every 5 years, or more frequently if you are over 54 years old. Skin check. Lung cancer screening. You may have this screening every year starting at age 40 if you have a 30-pack-year history of smoking and currently smoke or have quit within the past 15 years. Fecal occult blood test (FOBT) of the stool. You may have this test every year starting at age 67. Flexible sigmoidoscopy or colonoscopy. You may have a sigmoidoscopy every 5 years or a colonoscopy every 10 years starting at age 75. Hepatitis C blood test. Hepatitis B blood test. Sexually transmitted disease (STD) testing. Diabetes screening. This is done by checking your blood sugar (glucose) after you have not eaten for a while (fasting). You may have this done every 1-3 years. Bone density scan. This is done to screen for osteoporosis. You may have this done starting at age 49. Mammogram. This may be done every 1-2 years. Talk to your health care provider  about how often you should have regular mammograms. Talk with your health care provider about your test results, treatment  options, and if necessary, the need for more tests. Vaccines  Your health care provider may recommend certain vaccines, such as: Influenza vaccine. This is recommended every year. Tetanus, diphtheria, and acellular pertussis (Tdap, Td) vaccine. You may need a Td booster every 10 years. Zoster vaccine. You may need this after age 86. Pneumococcal 13-valent conjugate (PCV13) vaccine. One dose is recommended after age 36. Pneumococcal polysaccharide (PPSV23) vaccine. One dose is recommended after age 70. Talk to your health care provider about which screenings and vaccines you need and how often you need them. This information is not intended to replace advice given to you by your health care provider. Make sure you discuss any questions you have with your health care provider. Document Released: 04/14/2015 Document Revised: 12/06/2015 Document Reviewed: 01/17/2015 Elsevier Interactive Patient Education  2017 [place] Prevention in the Home Falls can cause injuries. They can happen to people of all ages. There are many things you can do to make your home safe and to help prevent falls. What can I do on the outside of my home? Regularly fix the edges of walkways and driveways and fix any cracks. Remove anything that might make you trip as you walk through a door, such as a raised step or threshold. Trim any bushes or trees on the path to your home. Use bright outdoor lighting. Clear any walking paths of anything that might make someone trip, such as rocks or tools. Regularly check to see if handrails are loose or broken. Make sure that both sides of any steps have handrails. Any raised decks and porches should have guardrails on the edges. Have any leaves, snow, or ice cleared regularly. Use sand or salt on walking paths during winter. Clean up any spills in your garage right away. This includes oil or grease spills. What can I do in the bathroom? Use night lights. Install grab  bars by the toilet and in the tub and shower. Do not use towel bars as grab bars. Use non-skid mats or decals in the tub or shower. If you need to sit down in the shower, use a plastic, non-slip stool. Keep the floor dry. Clean up any water that spills on the floor as soon as it happens. Remove soap buildup in the tub or shower regularly. Attach bath mats securely with double-sided non-slip rug tape. Do not have throw rugs and other things on the floor that can make you trip. What can I do in the bedroom? Use night lights. Make sure that you have a light by your bed that is easy to reach. Do not use any sheets or blankets that are too big for your bed. They should not hang down onto the floor. Have a firm chair that has side arms. You can use this for support while you get dressed. Do not have throw rugs and other things on the floor that can make you trip. What can I do in the kitchen? Clean up any spills right away. Avoid walking on wet floors. Keep items that you use a lot in easy-to-reach places. If you need to reach something above you, use a strong step stool that has a grab bar. Keep electrical cords out of the way. Do not use floor polish or wax that makes floors slippery. If you must use wax, use non-skid floor wax. Do not have throw rugs and  other things on the floor that can make you trip. What can I do with my stairs? Do not leave any items on the stairs. Make sure that there are handrails on both sides of the stairs and use them. Fix handrails that are broken or loose. Make sure that handrails are as long as the stairways. Check any carpeting to make sure that it is firmly attached to the stairs. Fix any carpet that is loose or worn. Avoid having throw rugs at the top or bottom of the stairs. If you do have throw rugs, attach them to the floor with carpet tape. Make sure that you have a light switch at the top of the stairs and the bottom of the stairs. If you do not have them,  ask someone to add them for you. What else can I do to help prevent falls? Wear shoes that: Do not have high heels. Have rubber bottoms. Are comfortable and fit you well. Are closed at the toe. Do not wear sandals. If you use a stepladder: Make sure that it is fully opened. Do not climb a closed stepladder. Make sure that both sides of the stepladder are locked into place. Ask someone to hold it for you, if possible. Clearly mark and make sure that you can see: Any grab bars or handrails. First and last steps. Where the edge of each step is. Use tools that help you move around (mobility aids) if they are needed. These include: Canes. Walkers. Scooters. Crutches. Turn on the lights when you go into a dark area. Replace any light bulbs as soon as they burn out. Set up your furniture so you have a clear path. Avoid moving your furniture around. If any of your floors are uneven, fix them. If there are any pets around you, be aware of where they are. Review your medicines with your doctor. Some medicines can make you feel dizzy. This can increase your chance of falling. Ask your doctor what other things that you can do to help prevent falls. This information is not intended to replace advice given to you by your health care provider. Make sure you discuss any questions you have with your health care provider. Document Released: 01/12/2009 Document Revised: 08/24/2015 Document Reviewed: 04/22/2014 Elsevier Interactive Patient Education  2017 [employer].

## 2021-04-10 ENCOUNTER — Encounter: Payer: Self-pay | Admitting: Nurse Practitioner

## 2021-04-10 ENCOUNTER — Ambulatory Visit (INDEPENDENT_AMBULATORY_CARE_PROVIDER_SITE_OTHER): Payer: Medicare Other | Admitting: Nurse Practitioner

## 2021-04-10 DIAGNOSIS — J069 Acute upper respiratory infection, unspecified: Secondary | ICD-10-CM

## 2021-04-10 NOTE — Progress Notes (Signed)
° °  Virtual Visit  Note Due to COVID-19 pandemic this visit was conducted virtually. This visit type was conducted due to national recommendations for restrictions regarding the COVID-19 Pandemic (e.g. social distancing, sheltering in place) in an effort to limit this patient's exposure and mitigate transmission in our community. All issues noted in this document were discussed and addressed.  A physical exam was not performed with this format.  I connected with Julia Poole on 04/10/21 at 12:35 by telephone and verified that I am speaking with the correct person using two identifiers. Julia Poole is currently located at home and no one is currently with her during visit. The provider, [name] Done, FNP is located in their office at time of visit.  I discussed the limitations, risks, security and privacy concerns of performing an evaluation and management service by telephone and the availability of in person appointments. I also discussed with the patient that there may be a patient responsible charge related to this service. The patient expressed understanding and agreed to proceed.   History and Present Illness:  URI  This is a new problem. The current episode started yesterday. The problem has been unchanged. There has been no fever. Associated symptoms include congestion, coughing and rhinorrhea. Pertinent negatives include no headaches or sore throat (scratchy). She has tried decongestant for the symptoms. The treatment provided mild relief.     Review of Systems  HENT:  Positive for congestion and rhinorrhea. Negative for sore throat (scratchy).   Respiratory:  Positive for cough.   Neurological:  Negative for headaches.    Observations/Objective: Alert and oriented- answers all questions appropriately No distress Voice raspy Slight cough noted  Assessment and Plan: Julia Poole in today with chief complaint of URI   1. URI with cough and congestion 1. Take meds  as prescribed 2. Use a cool mist humidifier especially during the winter months and when heat has been humid. 3. Use saline nose sprays frequently 4. Saline irrigations of the nose can be very helpful if done frequently.  * 4X daily for 1 week*  * Use of a nettie pot can be helpful with this. Follow directions with this* 5. Drink plenty of fluids 6. Keep thermostat turn down low 7.For any cough or congestion- mucinex d 8. For fever or aces or pains- take tylenol or ibuprofen appropriate for age and weight.  * for fevers greater than 101 orally you may alternate ibuprofen and tylenol every  3 hours.        Follow Up Instructions: prn    I discussed the assessment and treatment plan with the patient. The patient was provided an opportunity to ask questions and all were answered. The patient agreed with the plan and demonstrated an understanding of the instructions.   The patient was advised to call back or seek an in-person evaluation if the symptoms worsen or if the condition fails to improve as anticipated.  The above assessment and management plan was discussed with the patient. The patient verbalized understanding of and has agreed to the management plan. Patient is aware to call the clinic if symptoms persist or worsen. Patient is aware when to return to the clinic for a follow-up visit. Patient educated on when it is appropriate to go to the emergency department.   Time call ended:  12:47  I provided 12 minutes of  non face-to-face time during this encounter.    [name] Done, FNP

## 2021-04-16 ENCOUNTER — Encounter: Payer: Self-pay | Admitting: Family

## 2021-04-16 ENCOUNTER — Ambulatory Visit (INDEPENDENT_AMBULATORY_CARE_PROVIDER_SITE_OTHER): Payer: Medicare Other | Admitting: Family

## 2021-04-16 DIAGNOSIS — J209 Acute bronchitis, unspecified: Secondary | ICD-10-CM | POA: Diagnosis not present

## 2021-04-16 MED ORDER — PREDNISONE 10 MG (21) PO TBPK: ORAL_TABLET | ORAL | 0 refills | Status: DC

## 2021-04-16 MED ORDER — ALBUTEROL SULFATE HFA 108 (90 BASE) MCG/ACT IN AERS: 2.0000 | INHALATION_SPRAY | Freq: Four times a day (QID) | RESPIRATORY_TRACT | 0 refills | Status: DC | PRN

## 2021-04-16 NOTE — Progress Notes (Signed)
Virtual Visit  Note Due to COVID-19 pandemic this visit was conducted virtually. This visit type was conducted due to national recommendations for restrictions regarding the COVID-19 Pandemic (e.g. social distancing, sheltering in place) in an effort to limit this patient's exposure and mitigate transmission in our community. All issues noted in this document were discussed and addressed.  A physical exam was not performed with this format.  I connected with Julia Poole on 04/16/21 at 2:11 pm  by telephone and verified that I am speaking with the correct person using two identifiers. Julia Poole is currently located at car and no one is currently with her during visit. The provider, Evelina Dun, FNP is located in their office at time of visit.  I discussed the limitations, risks, security and privacy concerns of performing an evaluation and management service by telephone and the availability of in person appointments. I also discussed with the patient that there may be a patient responsible charge related to this service. The patient expressed understanding and agreed to proceed.  Julia Poole, Julia Poole are scheduled for a virtual visit with your provider today.    Just as we do with appointments in the office, we must obtain your consent to participate.  Your consent will be active for this visit and any virtual visit you may have with one of our providers in the next 365 days.    If you have a MyChart account, I can also send a copy of this consent to you electronically.  All virtual visits are billed to your insurance company just like a traditional visit in the office.  As this is a virtual visit, video technology does not allow for your provider to perform a traditional examination.  This may limit your provider's ability to fully assess your condition.  If your provider identifies any concerns that need to be evaluated in person or the need to arrange testing such as labs, EKG, etc, we will make  arrangements to do so.    Although advances in technology are sophisticated, we cannot ensure that it will always work on either your end or our end.  If the connection with a video visit is poor, we may have to switch to a telephone visit.  With either a video or telephone visit, we are not always able to ensure that we have a secure connection.   I need to obtain your verbal consent now.   Are you willing to proceed with your visit today?   Julia Poole has provided verbal consent on 04/16/2021 for a virtual visit (video or telephone).   Evelina Dun, [place] 04/16/2021  2:13 PM   History and Present Illness:  Cough This is a new problem. The current episode started 1 to 4 weeks ago. The problem has been gradually worsening. The problem occurs every few minutes. The cough is Productive of sputum. Associated symptoms include nasal congestion, postnasal drip, shortness of breath and wheezing. Pertinent negatives include no chills, ear congestion, ear pain, fever, headaches or myalgias. She has tried rest and OTC cough suppressant for the symptoms.     Review of Systems  Constitutional:  Negative for chills and fever.  HENT:  Positive for postnasal drip. Negative for ear pain.   Respiratory:  Positive for cough, shortness of breath and wheezing.   Musculoskeletal:  Negative for myalgias.  Neurological:  Negative for headaches.    Observations/Objective: No SOB or distress noted   Assessment and Plan: 1. Acute bronchitis, unspecified organism -  Take meds as prescribed - Use a cool mist humidifier  -Use saline nose sprays frequently -Force fluids -For any cough or congestion  Use plain Mucinex- regular strength or max strength is fine -For fever or aces or pains- take tylenol or ibuprofen. -Throat lozenges if help -Follow up if symptoms worsen or do not improve  - predniSONE (STERAPRED UNI-PAK 21 TAB) 10 MG (21) TBPK tablet; Use as directed  Dispense: 21 tablet; Refill: 0 -  albuterol (VENTOLIN HFA) 108 (90 Base) MCG/ACT inhaler; Inhale 2 puffs into the lungs every 6 (six) hours as needed for wheezing or shortness of breath.  Dispense: 8 g; Refill: 0     I discussed the assessment and treatment plan with the patient. The patient was provided an opportunity to ask questions and all were answered. The patient agreed with the plan and demonstrated an understanding of the instructions.   The patient was advised to call back or seek an in-person evaluation if the symptoms worsen or if the condition fails to improve as anticipated.  The above assessment and management plan was discussed with the patient. The patient verbalized understanding of and has agreed to the management plan. Patient is aware to call the clinic if symptoms persist or worsen. Patient is aware when to return to the clinic for a follow-up visit. Patient educated on when it is appropriate to go to the emergency department.   Time call ended:  2:22 pm   I provided 11 minutes of  non face-to-face time during this encounter.    Evelina Dun, FNP

## 2021-04-24 ENCOUNTER — Other Ambulatory Visit: Payer: Self-pay | Admitting: Neurosurgery

## 2021-04-24 DIAGNOSIS — D329 Benign neoplasm of meninges, unspecified: Secondary | ICD-10-CM

## 2021-04-30 ENCOUNTER — Other Ambulatory Visit: Payer: Self-pay | Admitting: Nurse Practitioner

## 2021-04-30 ENCOUNTER — Other Ambulatory Visit: Payer: Medicare Other

## 2021-04-30 ENCOUNTER — Encounter: Payer: Self-pay | Admitting: Nurse Practitioner

## 2021-04-30 ENCOUNTER — Other Ambulatory Visit: Payer: Self-pay

## 2021-04-30 ENCOUNTER — Ambulatory Visit (INDEPENDENT_AMBULATORY_CARE_PROVIDER_SITE_OTHER): Payer: Medicare Other

## 2021-04-30 ENCOUNTER — Other Ambulatory Visit: Payer: Self-pay | Admitting: Family Medicine

## 2021-04-30 ENCOUNTER — Ambulatory Visit (INDEPENDENT_AMBULATORY_CARE_PROVIDER_SITE_OTHER): Payer: Medicare Other | Admitting: Nurse Practitioner

## 2021-04-30 VITALS — BP 124/74 | HR 75 | Temp 97.8°F | Resp 20 | Ht 65.0 in | Wt 191.0 lb

## 2021-04-30 DIAGNOSIS — E782 Mixed hyperlipidemia: Secondary | ICD-10-CM | POA: Diagnosis not present

## 2021-04-30 DIAGNOSIS — D420 Neoplasm of uncertain behavior of cerebral meninges: Secondary | ICD-10-CM

## 2021-04-30 DIAGNOSIS — Z6831 Body mass index (BMI) 31.0-31.9, adult: Secondary | ICD-10-CM

## 2021-04-30 DIAGNOSIS — E034 Atrophy of thyroid (acquired): Secondary | ICD-10-CM | POA: Diagnosis not present

## 2021-04-30 DIAGNOSIS — M4316 Spondylolisthesis, lumbar region: Secondary | ICD-10-CM | POA: Diagnosis not present

## 2021-04-30 DIAGNOSIS — C50911 Malignant neoplasm of unspecified site of right female breast: Secondary | ICD-10-CM

## 2021-04-30 DIAGNOSIS — Z780 Asymptomatic menopausal state: Secondary | ICD-10-CM

## 2021-04-30 DIAGNOSIS — Z23 Encounter for immunization: Secondary | ICD-10-CM

## 2021-04-30 DIAGNOSIS — Z79891 Long term (current) use of opiate analgesic: Secondary | ICD-10-CM | POA: Diagnosis not present

## 2021-04-30 MED ORDER — ACETAMINOPHEN-CODEINE #3 300-30 MG PO TABS: 1.0000 | ORAL_TABLET | Freq: Four times a day (QID) | ORAL | 1 refills | Status: DC | PRN

## 2021-04-30 MED ORDER — ROSUVASTATIN CALCIUM 10 MG PO TABS: 10.0000 mg | ORAL_TABLET | Freq: Every day | ORAL | 1 refills | Status: DC

## 2021-04-30 MED ORDER — LEVOTHYROXINE SODIUM 50 MCG PO TABS: 50.0000 ug | ORAL_TABLET | Freq: Every day | ORAL | 1 refills | Status: DC

## 2021-04-30 NOTE — Patient Instructions (Signed)
Chronic Back Pain When back pain lasts longer than 3 months, it is called chronic back pain. Pain may get worse at certain times (flare-ups). There are things you can do at home to manage your pain. Follow these instructions at home: Pay attention to any changes in your symptoms. Take these actions to help with your pain: Managing pain and stiffness   If told, put ice on the painful area. Your doctor may tell you to use ice for 24-48 hours after the flare-up starts. To do this: Put ice in a plastic bag. Place a towel between your skin and the bag. Leave the ice on for 20 minutes, 2-3 times a day. If told, put heat on the painful area. Do this as often as told by your doctor. Use the heat source that your doctor recommends, such as a moist heat pack or a heating pad. Place a towel between your skin and the heat source. Leave the heat on for 20-30 minutes. Take off the heat if your skin turns bright red. This is especially important if you are unable to feel pain, heat, or cold. You may have a greater risk of getting burned. Soak in a warm bath. This can help relieve pain. Activity  Avoid bending and other activities that make pain worse. When standing: Keep your upper back and neck straight. Keep your shoulders pulled back. Avoid slouching. When sitting: Keep your back straight. Relax your shoulders. Do not round your shoulders or pull them backward. Do not sit or stand in one place for long periods of time. Take short rest breaks during the day. Lying down or standing is usually better than sitting. Resting can help relieve pain. When sitting or lying down for a long time, do some mild activity or stretching. This will help to prevent stiffness and pain. Get regular exercise. Ask your doctor what activities are safe for you. Do not lift anything that is heavier than 10 lb (4.5 kg) or the limit that you are told, until your doctor says that it is safe. To prevent injury when you lift  things: Bend your knees. Keep the weight close to your body. Avoid twisting. Sleep on a firm mattress. Try lying on your side with your knees slightly bent. If you lie on your back, put a pillow under your knees. Medicines Treatment may include medicines for pain and swelling taken by mouth or put on the skin, prescription pain medicine, or muscle relaxants. Take over-the-counter and prescription medicines only as told by your doctor. Ask your doctor if the medicine prescribed to you: Requires you to avoid driving or using machinery. Can cause trouble pooping (constipation). You may need to take these actions to prevent or treat trouble pooping: Drink enough fluid to keep your pee (urine) pale yellow. Take over-the-counter or prescription medicines. Eat foods that are high in fiber. These include beans, whole grains, and fresh fruits and vegetables. Limit foods that are high in fat and sugars. These include fried or sweet foods. General instructions Do not use any products that contain nicotine or tobacco, such as cigarettes, e-cigarettes, and chewing tobacco. If you need help quitting, ask your doctor. Keep all follow-up visits as told by your doctor. This is important. Contact a doctor if: Your pain does not get better with rest or medicine. Your pain gets worse, or you have new pain. You have a high fever. You lose weight very quickly. You have trouble doing your normal activities. Get help right away if: One   or both of your legs or feet feel weak. One or both of your legs or feet lose feeling (have numbness). You have trouble controlling when you poop (have a bowel movement) or pee (urinate). You have bad back pain and: You feel like you may vomit (nauseous), or you vomit. You have pain in your belly (abdomen). You have shortness of breath. You faint. Summary When back pain lasts longer than 3 months, it is called chronic back pain. Pain may get worse at certain times  (flare-ups). Use ice and heat as told by your doctor. Your doctor may tell you to use ice after flare-ups. This information is not intended to replace advice given to you by your health care provider. Make sure you discuss any questions you have with your health care provider. Document Revised: 04/28/2019 Document Reviewed: 04/28/2019 Elsevier Patient Education  2022 Elsevier Inc.  

## 2021-04-30 NOTE — Progress Notes (Signed)
Subjective:    Patient ID: Julia Poole, female    DOB: July 15, 1955, 66 y.o.   MRN: [id number]   Chief Complaint: medical management of chronic issues     HPI:  Julia Poole is a 66 y.o. who identifies as a female who was assigned female at birth.   Social history: Lives with: husband Work history: retired   Scientist, forensic in today for follow up of the following chronic medical issues:  1. Hypothyroidism due to acquired atrophy of thyroid No problems that she is aware of. Lab Results  Component Value Date   TSH 1.530 05/01/2020     2. Mixed hyperlipidemia Does try to watch diet but does no dedicated exercise. Lab Results  Component Value Date   CHOL 165 05/01/2020   HDL 52 05/01/2020   LDLCALC 92 05/01/2020   TRIG 116 05/01/2020   CHOLHDL 3.2 05/01/2020     3. Atypical meningioma of brain ([place]) Had surgery over 3 years ago and has done well. No reoccurence.  4. Malignant neoplasm of right female breast, unspecified estrogen receptor status, unspecified site of breast ([place]) Was in 2014. Has completed all treatments. Does mammograms every 2 years.  5. Spondylolisthesis of lumbar region Pain assessment: Cause of pain- spondylolisthesis Pain location- lumbar region Pain on scale of 1-10- 3/10 currently Frequency- daily What increases pain-to much activity What makes pain Better-rest Effects on ADL - none Any change in general medical condition-none  Current opioids rx- tylenol with codiene # meds rx- 90 Effectiveness of current meds-good  Adverse reactions from pain meds-none Morphine equivalent- 18MME  Pill count performed-No Last drug screen - 01/27/20 ( high risk q50m moderate risk q673mlow risk yearly ) Urine drug screen today- Yes [place]  If yes were their any concerning findings? - no   Overdose risk: 1 Opioid Risk  10/14/2018  Alcohol 0  Illegal Drugs 0  Rx Drugs 0  Alcohol 0  Illegal Drugs 0  Rx Drugs 0  Age between  16-45 years  0  History of Preadolescent Sexual Abuse 0  Psychological Disease 0  Depression 0  Opioid Risk Tool Scoring 0  Opioid Risk Interpretation Low Risk     Pain contract signed on:02/01/20   6. BMI 31.0-31.9,adult Weight is down 4lbs Wt Readings from Last 3 Encounters:  04/30/21 191 lb (86.6 kg)  02/14/21 195 lb (88.5 kg)  05/01/20 195 lb (88.5 kg)   BMI Readings from Last 3 Encounters:  04/30/21 31.78 kg/m  02/14/21 32.45 kg/m  05/01/20 33.47 kg/m     New complaints: None today  Allergies  Allergen Reactions   Norco [Hydrocodone-Acetaminophen] Nausea Only   Outpatient Encounter Medications as of 04/30/2021  Medication Sig   acetaminophen-codeine (TYLENOL #3) 300-30 MG tablet Take 1 tablet by mouth every 6 (six) hours as needed for moderate pain.   albuterol (VENTOLIN HFA) 108 (90 Base) MCG/ACT inhaler Inhale 2 puffs into the lungs every 6 (six) hours as needed for wheezing or shortness of breath.   Ascorbic Acid (VITAMIN C) 500 MG tablet Take 500 mg by mouth daily.    aspirin 81 MG tablet Take 81 mg by mouth daily.     Biotin 10 MG CAPS Take by mouth.   Cholecalciferol (VITAMIN D) 2000 UNITS tablet Take 2,000 Units by mouth 2 (two) times daily.   COCONUT OIL PO Take by mouth.   cyanocobalamin 100 MCG tablet Take 100 mcg by mouth daily.   FLAXSEED, LINSEED,  PO Take by mouth.   fluticasone (FLONASE) 50 MCG/ACT nasal spray Place 2 sprays into both nostrils daily. (Patient taking differently: Place 2 sprays into both nostrils daily as needed.)   Glucosamine-Chondroitin (OSTEO BI-FLEX REGULAR STRENGTH PO) Take by mouth.   Inulin (FIBER CHOICE) 1.5 g CHEW Chew by mouth.   levothyroxine (SYNTHROID) 50 MCG tablet Take 1 tablet (50 mcg total) by mouth daily.   Multiple Vitamins-Minerals (CENTRUM SILVER PO) Take by mouth.     predniSONE (STERAPRED UNI-PAK 21 TAB) 10 MG (21) TBPK tablet Use as directed   rosuvastatin (CRESTOR) 10 MG tablet Take 1 tablet (10 mg total)  by mouth daily.   TURMERIC PO Take by mouth.   vitamin E 180 MG (400 UNITS) capsule Take 400 Units by mouth daily.   No facility-administered encounter medications on file as of 04/30/2021.    Past Surgical History:  Procedure Laterality Date   BRAIN SURGERY     BREAST SURGERY  AUG. 2010   right breast lumpectomy   BREAST SURGERY     Cancer   COLONOSCOPY     CRANIOTOMY Left 10/16/2015   Procedure: PARA-SAGITTAL CRANIOTOMY WITH STEREOTACTIC NAVIGATION FOR TUMOR;  Surgeon: Kevan Ny Ditty, MD;  Location: [facility];  Service: Neurosurgery;  Laterality: Left;   GANGLION CYST EXCISION  SEP. 1997   RIGHT WRIST    Family History  Problem Relation Age of Onset   Colon cancer Mother 37   Diabetes Father    Heart disease Father    Cancer Father        skin   Breast cancer Brother 27   Cancer Brother        bone mets   Leukemia Sister 63       CLL   Cancer Brother        skin   Ovarian cancer Maternal Aunt        dx in her 55s   Tuberculosis Maternal Grandmother    Heart disease Maternal Grandfather    Diabetes Paternal Grandmother    Alcohol abuse Paternal Grandfather    Esophageal cancer Neg Hx    Rectal cancer Neg Hx    Stomach cancer Neg Hx            Review of Systems  Constitutional:  Negative for diaphoresis.  Eyes:  Negative for pain.  Respiratory:  Negative for shortness of breath.   Cardiovascular:  Negative for chest pain, palpitations and leg swelling.  Gastrointestinal:  Negative for abdominal pain.  Endocrine: Negative for polydipsia.  Skin:  Negative for rash.  Neurological:  Negative for dizziness, weakness and headaches.  Hematological:  Does not bruise/bleed easily.  All other systems reviewed and are negative.     Objective:   Physical Exam Vitals and nursing note reviewed.  Constitutional:      General: She is not in acute distress.    Appearance: Normal appearance. She is well-developed.  HENT:     Head: Normocephalic.      Right Ear: Tympanic membrane normal.     Left Ear: Tympanic membrane normal.     Nose: Nose normal.     Mouth/Throat:     Mouth: Mucous membranes are moist.  Eyes:     Pupils: Pupils are equal, round, and reactive to light.  Neck:     Vascular: No carotid bruit or JVD.  Cardiovascular:     Rate and Rhythm: Normal rate and regular rhythm.     Heart sounds: Normal heart  sounds.  Pulmonary:     Effort: Pulmonary effort is normal. No respiratory distress.     Breath sounds: Normal breath sounds. No wheezing or rales.  Chest:     Chest [place]: No tenderness.  Abdominal:     General: Bowel sounds are normal. There is no distension or abdominal bruit.     Palpations: Abdomen is soft. There is no hepatomegaly, splenomegaly, mass or pulsatile mass.     Tenderness: There is no abdominal tenderness.  Musculoskeletal:        General: Normal range of motion.     Cervical back: Normal range of motion and neck supple.  Lymphadenopathy:     Cervical: No cervical adenopathy.  Skin:    General: Skin is warm and dry.  Neurological:     Mental Status: She is alert and oriented to person, place, and time.     Deep Tendon Reflexes: Reflexes are normal and symmetric.  Psychiatric:        Behavior: Behavior normal.        Thought Content: Thought content normal.        Judgment: Judgment normal.    BP 124/74    Pulse 75    Temp 97.8 F (36.6 C) (Temporal)    Resp 20    Ht '5\' 5"'  (1.651 m)    Wt 191 lb (86.6 kg)    SpO2 100%    BMI 31.78 kg/m        Assessment & Plan:  Daun Rens [place] comes in today with chief complaint of Medical Management of Chronic Issues   Diagnosis and orders addressed:  1. Hypothyroidism due to acquired atrophy of thyroid Labs oneding - levothyroxine (SYNTHROID) 50 MCG tablet; Take 1 tablet (50 mcg total) by mouth daily.  Dispense: 90 tablet; Refill: 1 - Thyroid Panel With TSH  2. Mixed hyperlipidemia Low fat diet - rosuvastatin (CRESTOR) 10 MG tablet; Take 1  tablet (10 mg total) by mouth daily.  Dispense: 90 tablet; Refill: 1 - CBC with Differential/Platelet - CMP14+EGFR - Lipid panel  3. 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 Malignant neoplasm of right female breast, unspecified estrogen receptor status, unspecified site of breast ([place]) Keep up with mammograms  5. Spondylolisthesis of lumbar region Back stretches - acetaminophen-codeine (TYLENOL #3) 300-30 MG tablet; Take 1 tablet by mouth every 6 (six) hours as needed for moderate pain.  Dispense: 120 tablet; Refill: 1  6. BMI 31.0-31.9,adult Discussed diet and exercise for person with BMI >25 Will recheck weight in 3-6 months    Labs pending Health Maintenance reviewed Diet and exercise encouraged  Follow up plan: 40month   Mary-Margaret MHassell Done FNP

## 2021-04-30 NOTE — Addendum Note (Signed)
Addended by: Chevis Pretty on: 04/30/2021 10:50 AM   Modules accepted: Orders

## 2021-05-01 DIAGNOSIS — Z780 Asymptomatic menopausal state: Secondary | ICD-10-CM | POA: Diagnosis not present

## 2021-05-01 LAB — CMP14+EGFR
ALT: 21 IU/L (ref 0–32)
AST: 22 IU/L (ref 0–40)
Albumin/Globulin Ratio: 1.9 (ref 1.2–2.2)
Albumin: 4.3 g/dL (ref 3.8–4.8)
Alkaline Phosphatase: 94 IU/L (ref 44–121)
BUN/Creatinine Ratio: 20 (ref 12–28)
BUN: 13 mg/dL (ref 8–27)
Bilirubin Total: 1 mg/dL (ref 0.0–1.2)
CO2: 27 mmol/L (ref 20–29)
Calcium: 10 mg/dL (ref 8.7–10.3)
Chloride: 105 mmol/L (ref 96–106)
Creatinine, Ser: 0.66 mg/dL (ref 0.57–1.00)
Globulin, Total: 2.3 g/dL (ref 1.5–4.5)
Glucose: 100 mg/dL — ABNORMAL HIGH (ref 70–99)
Potassium: 4.7 mmol/L (ref 3.5–5.2)
Sodium: 142 mmol/L (ref 134–144)
Total Protein: 6.6 g/dL (ref 6.0–8.5)
eGFR: 96 mL/min/{1.73_m2} (ref >59)

## 2021-05-01 LAB — CBC WITH DIFFERENTIAL/PLATELET
Basophils Absolute: 0 10*3/uL (ref 0.0–0.2)
Basos: 0 %
EOS (ABSOLUTE): 0.3 10*3/uL (ref 0.0–0.4)
Eos: 4 %
Hematocrit: 43.9 % (ref 34.0–46.6)
Hemoglobin: 14.8 g/dL (ref 11.1–15.9)
Immature Grans (Abs): 0 10*3/uL (ref 0.0–0.1)
Immature Granulocytes: 0 %
Lymphocytes Absolute: 2.2 10*3/uL (ref 0.7–3.1)
Lymphs: 34 %
MCH: 30.6 pg (ref 26.6–33.0)
MCHC: 33.7 g/dL (ref 31.5–35.7)
MCV: 91 fL (ref 79–97)
Monocytes Absolute: 0.5 10*3/uL (ref 0.1–0.9)
Monocytes: 7 %
Neutrophils Absolute: 3.5 10*3/uL (ref 1.4–7.0)
Neutrophils: 55 %
Platelets: 251 10*3/uL (ref 150–450)
RBC: 4.83 x10E6/uL (ref 3.77–5.28)
RDW: 12.5 % (ref 11.7–15.4)
WBC: 6.4 10*3/uL (ref 3.4–10.8)

## 2021-05-01 LAB — LIPID PANEL
Chol/HDL Ratio: 3.4 ratio (ref 0.0–4.4)
Cholesterol, Total: 167 mg/dL (ref 100–199)
HDL: 49 mg/dL (ref >39)
LDL Chol Calc (NIH): 96 mg/dL (ref 0–99)
Triglycerides: 125 mg/dL (ref 0–149)
VLDL Cholesterol Cal: 22 mg/dL (ref 5–40)

## 2021-05-01 LAB — THYROID PANEL WITH TSH
Free Thyroxine Index: 2.1 (ref 1.2–4.9)
T3 Uptake Ratio: 27 % (ref 24–39)
T4, Total: 7.6 ug/dL (ref 4.5–12.0)
TSH: 0.723 u[IU]/mL (ref 0.450–4.500)

## 2021-05-01 NOTE — Addendum Note (Signed)
Addended by: Rolena Infante on: 05/01/2021 01:33 PM   Modules accepted: Orders

## 2021-05-04 ENCOUNTER — Other Ambulatory Visit: Payer: Self-pay | Admitting: Radiation Therapy

## 2021-05-07 LAB — TOXASSURE SELECT 13 (MW), URINE

## 2021-05-19 ENCOUNTER — Ambulatory Visit
Admission: RE | Admit: 2021-05-19 | Discharge: 2021-05-19 | Disposition: A | Discharge status: Home / Self Care | Payer: Medicare Other | Source: Ambulatory Visit | Attending: Neurosurgery | Admitting: Neurosurgery

## 2021-05-19 ENCOUNTER — Other Ambulatory Visit: Payer: Self-pay

## 2021-05-19 DIAGNOSIS — D329 Benign neoplasm of meninges, unspecified: Secondary | ICD-10-CM | POA: Diagnosis not present

## 2021-05-19 MED ORDER — GADOBENATE DIMEGLUMINE 529 MG/ML IV SOLN
18.0000 mL | Freq: Once | INTRAVENOUS | Status: AC | PRN
  Administered 2021-05-19: 18 mL via INTRAVENOUS

## 2021-05-21 ENCOUNTER — Inpatient Hospital Stay: Payer: Medicare Other | Attending: Neurosurgery

## 2021-05-23 DIAGNOSIS — D329 Benign neoplasm of meninges, unspecified: Secondary | ICD-10-CM | POA: Diagnosis not present

## 2021-05-23 DIAGNOSIS — R030 Elevated blood-pressure reading, without diagnosis of hypertension: Secondary | ICD-10-CM | POA: Diagnosis not present

## 2021-07-27 ENCOUNTER — Ambulatory Visit (INDEPENDENT_AMBULATORY_CARE_PROVIDER_SITE_OTHER): Payer: Medicare Other | Admitting: Nurse Practitioner

## 2021-07-27 ENCOUNTER — Encounter: Payer: Self-pay | Admitting: Nurse Practitioner

## 2021-07-27 VITALS — BP 120/70 | HR 74 | Temp 97.6°F | Resp 20 | Ht 65.0 in | Wt 190.0 lb

## 2021-07-27 DIAGNOSIS — Z23 Encounter for immunization: Secondary | ICD-10-CM | POA: Diagnosis not present

## 2021-07-27 DIAGNOSIS — E034 Atrophy of thyroid (acquired): Secondary | ICD-10-CM

## 2021-07-27 DIAGNOSIS — Z6831 Body mass index (BMI) 31.0-31.9, adult: Secondary | ICD-10-CM

## 2021-07-27 DIAGNOSIS — M4316 Spondylolisthesis, lumbar region: Secondary | ICD-10-CM | POA: Diagnosis not present

## 2021-07-27 DIAGNOSIS — E782 Mixed hyperlipidemia: Secondary | ICD-10-CM

## 2021-07-27 DIAGNOSIS — D420 Neoplasm of uncertain behavior of cerebral meninges: Secondary | ICD-10-CM | POA: Diagnosis not present

## 2021-07-27 MED ORDER — ROSUVASTATIN CALCIUM 10 MG PO TABS: 10.0000 mg | ORAL_TABLET | Freq: Every day | ORAL | 1 refills | Status: DC

## 2021-07-27 MED ORDER — ACETAMINOPHEN-CODEINE #3 300-30 MG PO TABS: 1.0000 | ORAL_TABLET | Freq: Four times a day (QID) | ORAL | 1 refills | Status: DC | PRN

## 2021-07-27 MED ORDER — LEVOTHYROXINE SODIUM 50 MCG PO TABS: 50.0000 ug | ORAL_TABLET | Freq: Every day | ORAL | 1 refills | Status: DC

## 2021-07-27 NOTE — Addendum Note (Signed)
Addended by: Rolena Infante on: 07/27/2021 02:00 PM ? ? Modules accepted: Orders ? ?

## 2021-07-27 NOTE — Progress Notes (Signed)
? ?Subjective:  ? ? Patient ID: Julia Poole, female    DOB: 11-19-1955, 66 y.o.   MRN: [id number] ? ? ?Chief Complaint: Medical Management of Chronic Issues ?  ? ?HPI: ? ?Julia Poole is a 66 y.o. who identifies as a female who was assigned female at birth.  ? ?Social history: ?Lives with: husband ?Work history: retired ? ? ?Comes in today for follow up of the following chronic medical issues: ? ?1. Mixed hyperlipidemia ?Does not really watch diet. Does no dedicated exercise. ?Lab Results  ?Component Value Date  ? CHOL 167 04/30/2021  ? HDL 49 04/30/2021  ? [place] 96 04/30/2021  ? TRIG 125 04/30/2021  ? CHOLHDL 3.4 04/30/2021  ? ? ? ?2. Hypothyroidism due to acquired atrophy of thyroid ?No problems that she is aware of. ?Lab Results  ?Component Value Date  ? TSH 0.723 04/30/2021  ? ? ? ?3. Atypical meningioma of brain ([place]) ?Has had no reoccurence ? ?4. Spondylolisthesis of lumbar region ?Has chronic back pains ?Pain assessment: ?Cause of pain- spondylolisthesis ?Pain location- mainly in lower back ?Pain on scale of 1-10- 3/10 ?Frequency- daily ?What increases pain-to much activity ?What makes pain Better-rest helps ?Effects on ADL - none ?Any change in general medical condition-none ? ?Current opioids rx- tylenol #3 ?# meds rx- 120 ?Effectiveness of current meds-helps ?Adverse reactions from pain meds-none ?Morphine equivalent- 18 MME ? ?Pill count performed-Yes ?Last drug screen - 04/30/21 ?( high risk q102m moderate risk q658mlow risk yearly ) ?Urine drug screen today- No ?Was the [place]- yes ? If yes were their any concerning findings? - no ? ? ?Overdose risk: 1 ? ?  10/14/2018  ? 10:25 AM  ?Opioid Risk   ?Alcohol 0  ?Illegal Drugs 0  ?Rx Drugs 0  ?Alcohol 0  ?Illegal Drugs 0  ?Rx Drugs 0  ?Age between 1636-45ears  0  ?History of Preadolescent Sexual Abuse 0  ?Psychological Disease 0  ?Depression 0  ?Opioid Risk Tool Scoring 0  ?Opioid Risk Interpretation Low Risk  ? ? ? ?Pain contract signed on:  2021 ? ? ?5. BMI 31.0-31.9,adult ?No recent weight changes ?Wt Readings from Last 3 Encounters:  ?07/27/21 190 lb (86.2 kg)  ?04/30/21 191 lb (86.6 kg)  ?02/14/21 195 lb (88.5 kg)  ? ?BMI Readings from Last 3 Encounters:  ?07/27/21 31.62 kg/m?  ?04/30/21 31.78 kg/m?  ?02/14/21 32.45 kg/m?  ? ? ? ? ?New complaints: ?None today ? ?Allergies  ?Allergen Reactions  ? Norco [Hydrocodone-Acetaminophen] Nausea Only  ? ?Outpatient Encounter Medications as of 07/27/2021  ?Medication Sig  ? acetaminophen-codeine (TYLENOL #3) 300-30 MG tablet Take 1 tablet by mouth every 6 (six) hours as needed for moderate pain.  ? albuterol (VENTOLIN HFA) 108 (90 Base) MCG/ACT inhaler Inhale 2 puffs into the lungs every 6 (six) hours as needed for wheezing or shortness of breath.  ? Ascorbic Acid (VITAMIN C) 500 MG tablet Take 500 mg by mouth daily.   ? aspirin 81 MG tablet Take 81 mg by mouth daily.    ? Biotin 10 MG CAPS Take by mouth.  ? Cholecalciferol (VITAMIN D) 2000 UNITS tablet Take 2,000 Units by mouth 2 (two) times daily.  ? COCONUT OIL PO Take by mouth.  ? cyanocobalamin 100 MCG tablet Take 100 mcg by mouth daily.  ? FLAXSEED, LINSEED, PO Take by mouth.  ? fluticasone (FLONASE) 50 MCG/ACT nasal spray Place 2 sprays into both nostrils daily. (Patient taking differently: Place 2  sprays into both nostrils daily as needed.)  ? Glucosamine-Chondroitin (OSTEO BI-FLEX REGULAR STRENGTH PO) Take by mouth.  ? Inulin (FIBER CHOICE) 1.5 g CHEW Chew by mouth.  ? levothyroxine (SYNTHROID) 50 MCG tablet Take 1 tablet (50 mcg total) by mouth daily.  ? Multiple Vitamins-Minerals (CENTRUM SILVER PO) Take by mouth.    ? rosuvastatin (CRESTOR) 10 MG tablet Take 1 tablet (10 mg total) by mouth daily.  ? TURMERIC PO Take by mouth.  ? vitamin E 180 MG (400 UNITS) capsule Take 400 Units by mouth daily.  ? ?No facility-administered encounter medications on file as of 07/27/2021.  ? ? ?Past Surgical History:  ?Procedure Laterality Date  ? BRAIN SURGERY    ?  BREAST SURGERY  AUG. 2010  ? right breast lumpectomy  ? BREAST SURGERY    ? Cancer  ? COLONOSCOPY    ? CRANIOTOMY Left 10/16/2015  ? Procedure: PARA-SAGITTAL CRANIOTOMY WITH STEREOTACTIC NAVIGATION FOR TUMOR;  Surgeon: Kevan Ny Ditty, MD;  Location: [facility];  Service: Neurosurgery;  Laterality: Left;  ? GANGLION CYST EXCISION  SEP. 1997  ? RIGHT WRIST  ? ? ?Family History  ?Problem Relation Age of Onset  ? Colon cancer Mother 42  ? Diabetes Father   ? Heart disease Father   ? Cancer Father   ?     skin  ? Breast cancer Brother 57  ? Cancer Brother   ?     bone mets  ? Leukemia Sister 35  ?     CLL  ? Cancer Brother   ?     skin  ? Ovarian cancer Maternal Aunt   ?     dx in her 60s  ? Tuberculosis Maternal Grandmother   ? Heart disease Maternal Grandfather   ? Diabetes Paternal Grandmother   ? Alcohol abuse Paternal Grandfather   ? Esophageal cancer Neg Hx   ? Rectal cancer Neg Hx   ? Stomach cancer Neg Hx   ? ? ? ? ? ? ?Review of Systems  ?Constitutional:  Negative for diaphoresis.  ?Eyes:  Negative for pain.  ?Respiratory:  Negative for shortness of breath.   ?Cardiovascular:  Negative for chest pain, palpitations and leg swelling.  ?Gastrointestinal:  Negative for abdominal pain.  ?Endocrine: Negative for polydipsia.  ?Skin:  Negative for rash.  ?Neurological:  Negative for dizziness, weakness and headaches.  ?Hematological:  Does not bruise/bleed easily.  ?All other systems reviewed and are negative. ? ?   ?Objective:  ? Physical Exam ?Vitals and nursing note reviewed.  ?Constitutional:   ?   General: She is not in acute distress. ?   Appearance: Normal appearance. She is well-developed.  ?HENT:  ?   Head: Normocephalic.  ?   Right Ear: Tympanic membrane normal.  ?   Left Ear: Tympanic membrane normal.  ?   Nose: Nose normal.  ?   Mouth/Throat:  ?   Mouth: Mucous membranes are moist.  ?Eyes:  ?   Pupils: Pupils are equal, round, and reactive to light.  ?Neck:  ?   Vascular: No carotid bruit or JVD.   ?Cardiovascular:  ?   Rate and Rhythm: Normal rate and regular rhythm.  ?   Heart sounds: Normal heart sounds.  ?Pulmonary:  ?   Effort: Pulmonary effort is normal. No respiratory distress.  ?   Breath sounds: Normal breath sounds. No wheezing or rales.  ?Chest:  ?   Chest Cio[place]k: No tenderness.  ?Abdominal:  ?  General: Bowel sounds are normal. There is no distension or abdominal bruit.  ?   Palpations: Abdomen is soft. There is no hepatomegaly, splenomegaly, mass or pulsatile mass.  ?   Tenderness: There is no abdominal tenderness.  ?Musculoskeletal:     ?   General: Normal range of motion.  ?   Cervical back: Normal range of motion and neck supple.  ?Lymphadenopathy:  ?   Cervical: No cervical adenopathy.  ?Skin: ?   General: Skin is warm and dry.  ?Neurological:  ?   General: No focal deficit present.  ?   Mental Status: She is alert and oriented to person, place, and time.  ?   Cranial Nerves: No cranial nerve deficit.  ?   Sensory: No sensory deficit.  ?   Deep Tendon Reflexes: Reflexes are normal and symmetric.  ?Psychiatric:     ?   Behavior: Behavior normal.     ?   Thought Content: Thought content normal.     ?   Judgment: Judgment normal.  ? ? ?BP 120/70   Pulse 74   Temp 97.6 ?F (36.4 ?C) (Temporal)   Resp 20   Ht _0  (1.651 m)   Wt 190 lb (86.2 kg)   SpO2 100%   BMI 31.62 kg/m?  ? ? ? ?   ?Assessment & Plan:  ?ABBIEGAIL LANDGREN comes in today with chief complaint of Medical Management of Chronic Issues ? ? ?Diagnosis and orders addressed: ? ?1. Mixed hyperlipidemia ?Low fat diet ?- rosuvastatin (CRESTOR) 10 MG tablet; Take 1 tablet (10 mg total) by mouth daily.  Dispense: 90 tablet; Refill: 1 ?- CBC with Differential/Platelet ?- CMP14+EGFR ?- Lipid panel ? ?2. Hypothyroidism due to acquired atrophy of thyroid ?Labs pending ?- levothyroxine (SYNTHROID) 50 MCG tablet; Take 1 tablet (50 mcg total) by mouth daily.  Dispense: 90 tablet; Refill: 1 ?- Thyroid Panel With TSH ? ?3. Atypical meningioma of  brain ([place]) ? ?4. Spondylolisthesis of lumbar region ?Moist heat ?rest ?- acetaminophen-codeine (TYLENOL #3) 300-30 MG tablet; Take 1 tablet by mouth every 6 (six) hours as needed for moderate pain.  Dispense: 120 table

## 2021-07-28 LAB — CBC WITH DIFFERENTIAL/PLATELET
Basophils Absolute: 0 10*3/uL (ref 0.0–0.2)
Basos: 0 %
EOS (ABSOLUTE): 0.2 10*3/uL (ref 0.0–0.4)
Eos: 3 %
Hematocrit: 44.3 % (ref 34.0–46.6)
Hemoglobin: 14.9 g/dL (ref 11.1–15.9)
Immature Grans (Abs): 0 10*3/uL (ref 0.0–0.1)
Immature Granulocytes: 0 %
Lymphocytes Absolute: 2.2 10*3/uL (ref 0.7–3.1)
Lymphs: 42 %
MCH: 31 pg (ref 26.6–33.0)
MCHC: 33.6 g/dL (ref 31.5–35.7)
MCV: 92 fL (ref 79–97)
Monocytes Absolute: 0.5 10*3/uL (ref 0.1–0.9)
Monocytes: 9 %
Neutrophils Absolute: 2.4 10*3/uL (ref 1.4–7.0)
Neutrophils: 46 %
Platelets: 261 10*3/uL (ref 150–450)
RBC: 4.81 x10E6/uL (ref 3.77–5.28)
RDW: 12.8 % (ref 11.7–15.4)
WBC: 5.2 10*3/uL (ref 3.4–10.8)

## 2021-07-28 LAB — CMP14+EGFR
ALT: 20 IU/L (ref 0–32)
AST: 21 IU/L (ref 0–40)
Albumin/Globulin Ratio: 2 (ref 1.2–2.2)
Albumin: 4.5 g/dL (ref 3.8–4.8)
Alkaline Phosphatase: 91 IU/L (ref 44–121)
BUN/Creatinine Ratio: 21 (ref 12–28)
BUN: 13 mg/dL (ref 8–27)
Bilirubin Total: 1.1 mg/dL (ref 0.0–1.2)
CO2: 24 mmol/L (ref 20–29)
Calcium: 10.1 mg/dL (ref 8.7–10.3)
Chloride: 104 mmol/L (ref 96–106)
Creatinine, Ser: 0.62 mg/dL (ref 0.57–1.00)
Globulin, Total: 2.2 g/dL (ref 1.5–4.5)
Glucose: 99 mg/dL (ref 70–99)
Potassium: 4.2 mmol/L (ref 3.5–5.2)
Sodium: 141 mmol/L (ref 134–144)
Total Protein: 6.7 g/dL (ref 6.0–8.5)
eGFR: 98 mL/min/{1.73_m2} (ref >59)

## 2021-07-28 LAB — LIPID PANEL
Chol/HDL Ratio: 3 ratio (ref 0.0–4.4)
Cholesterol, Total: 154 mg/dL (ref 100–199)
HDL: 52 mg/dL (ref >39)
LDL Chol Calc (NIH): 81 mg/dL (ref 0–99)
Triglycerides: 116 mg/dL (ref 0–149)
VLDL Cholesterol Cal: 21 mg/dL (ref 5–40)

## 2021-07-28 LAB — THYROID PANEL WITH TSH
Free Thyroxine Index: 2 (ref 1.2–4.9)
T3 Uptake Ratio: 27 % (ref 24–39)
T4, Total: 7.5 ug/dL (ref 4.5–12.0)
TSH: 1.25 u[IU]/mL (ref 0.450–4.500)

## 2021-08-22 DIAGNOSIS — H2513 Age-related nuclear cataract, bilateral: Secondary | ICD-10-CM | POA: Diagnosis not present

## 2021-08-30 ENCOUNTER — Encounter: Payer: Self-pay | Admitting: Gastroenterology

## 2021-10-23 ENCOUNTER — Encounter: Payer: Self-pay | Admitting: Nurse Practitioner

## 2021-10-23 ENCOUNTER — Ambulatory Visit (INDEPENDENT_AMBULATORY_CARE_PROVIDER_SITE_OTHER): Payer: Medicare Other | Admitting: Nurse Practitioner

## 2021-10-23 VITALS — BP 131/76 | HR 68 | Temp 98.0°F | Resp 20 | Ht 65.0 in | Wt 190.0 lb

## 2021-10-23 DIAGNOSIS — E034 Atrophy of thyroid (acquired): Secondary | ICD-10-CM | POA: Diagnosis not present

## 2021-10-23 DIAGNOSIS — M4316 Spondylolisthesis, lumbar region: Secondary | ICD-10-CM | POA: Diagnosis not present

## 2021-10-23 DIAGNOSIS — D420 Neoplasm of uncertain behavior of cerebral meninges: Secondary | ICD-10-CM

## 2021-10-23 DIAGNOSIS — Z6831 Body mass index (BMI) 31.0-31.9, adult: Secondary | ICD-10-CM

## 2021-10-23 DIAGNOSIS — E782 Mixed hyperlipidemia: Secondary | ICD-10-CM

## 2021-10-23 MED ORDER — ROSUVASTATIN CALCIUM 10 MG PO TABS: 10.0000 mg | ORAL_TABLET | Freq: Every day | ORAL | 1 refills | Status: DC

## 2021-10-23 MED ORDER — LEVOTHYROXINE SODIUM 50 MCG PO TABS: 50.0000 ug | ORAL_TABLET | Freq: Every day | ORAL | 1 refills | Status: DC

## 2021-10-23 MED ORDER — ACETAMINOPHEN-CODEINE 300-30 MG PO TABS: 1.0000 | ORAL_TABLET | ORAL | 0 refills | Status: AC | PRN

## 2021-10-23 MED ORDER — ACETAMINOPHEN-CODEINE 300-30 MG PO TABS: 1.0000 | ORAL_TABLET | ORAL | 0 refills | Status: DC | PRN

## 2021-10-23 NOTE — Patient Instructions (Signed)
Back Exercises These exercises help to make your trunk and back strong. They also help to keep the lower back flexible. Doing these exercises can help to prevent or lessen pain in your lower back. If you have back pain, try to do these exercises 2-3 times each day or as told by your doctor. As you get better, do the exercises once each day. Repeat the exercises more often as told by your doctor. To stop back pain from coming back, do the exercises once each day, or as told by your doctor. Do exercises exactly as told by your doctor. Stop right away if you feel sudden pain or your pain gets worse. Exercises Single knee to chest Do these steps 3-5 times in a row for each leg: Lie on your back on a firm bed or the floor with your legs stretched out. Bring one knee to your chest. Grab your knee or thigh with both hands and hold it in place. Pull on your knee until you feel a gentle stretch in your lower back or butt. Keep doing the stretch for 10-30 seconds. Slowly let go of your leg and straighten it. Pelvic tilt Do these steps 5-10 times in a row: Lie on your back on a firm bed or the floor with your legs stretched out. Bend your knees so they point up to the ceiling. Your feet should be flat on the floor. Tighten your lower belly (abdomen) muscles to press your lower back against the floor. This will make your tailbone point up to the ceiling instead of pointing down to your feet or the floor. Stay in this position for 5-10 seconds while you gently tighten your muscles and breathe evenly. Cat-cow Do these steps until your lower back bends more easily: Get on your hands and knees on a firm bed or the floor. Keep your hands under your shoulders, and keep your knees under your hips. You may put padding under your knees. Let your head hang down toward your chest. Tighten (contract) the muscles in your belly. Point your tailbone toward the floor so your lower back becomes rounded like the back of a  cat. Stay in this position for 5 seconds. Slowly lift your head. Let the muscles of your belly relax. Point your tailbone up toward the ceiling so your back forms a sagging arch like the back of a cow. Stay in this position for 5 seconds.  Press-ups Do these steps 5-10 times in a row: Lie on your belly (face-down) on a firm bed or the floor. Place your hands near your head, about shoulder-width apart. While you keep your back relaxed and keep your hips on the floor, slowly straighten your arms to raise the top half of your body and lift your shoulders. Do not use your back muscles. You may change where you place your hands to make yourself more comfortable. Stay in this position for 5 seconds. Keep your back relaxed. Slowly return to lying flat on the floor.  Bridges Do these steps 10 times in a row: Lie on your back on a firm bed or the floor. Bend your knees so they point up to the ceiling. Your feet should be flat on the floor. Your arms should be flat at your sides, next to your body. Tighten your butt muscles and lift your butt off the floor until your waist is almost as high as your knees. If you do not feel the muscles working in your butt and the back of   your thighs, slide your feet 1-2 inches (2.5-5 cm) farther away from your butt. Stay in this position for 3-5 seconds. Slowly lower your butt to the floor, and let your butt muscles relax. If this exercise is too easy, try doing it with your arms crossed over your chest. Belly crunches Do these steps 5-10 times in a row: Lie on your back on a firm bed or the floor with your legs stretched out. Bend your knees so they point up to the ceiling. Your feet should be flat on the floor. Cross your arms over your chest. Tip your chin a little bit toward your chest, but do not bend your neck. Tighten your belly muscles and slowly raise your chest just enough to lift your shoulder blades a tiny bit off the floor. Avoid raising your body  higher than that because it can put too much stress on your lower back. Slowly lower your chest and your head to the floor. Back lifts Do these steps 5-10 times in a row: Lie on your belly (face-down) with your arms at your sides, and rest your forehead on the floor. Tighten the muscles in your legs and your butt. Slowly lift your chest off the floor while you keep your hips on the floor. Keep the back of your head in line with the curve in your back. Look at the floor while you do this. Stay in this position for 3-5 seconds. Slowly lower your chest and your face to the floor. Contact a doctor if: Your back pain gets a lot worse when you do an exercise. Your back pain does not get better within 2 hours after you exercise. If you have any of these problems, stop doing the exercises. Do not do them again unless your doctor says it is okay. Get help right away if: You have sudden, very bad back pain. If this happens, stop doing the exercises. Do not do them again unless your doctor says it is okay. This information is not intended to replace advice given to you by your health care provider. Make sure you discuss any questions you have with your health care provider. Document Revised: 05/31/2020 Document Reviewed: 05/31/2020 Elsevier Patient Education  2023 [employer]  

## 2021-10-23 NOTE — Progress Notes (Signed)
Subjective:    Patient ID: Julia Poole, female    DOB: [birth year], 66 y.o.   MRN: [id number]   Chief Complaint: Medical Management of Chronic Issues    HPI:  Julia Poole is a 66 y.o. who identifies as a female who was assigned female at birth.   Social history: Lives with: husband and son Work history: retired   Scientist, forensic in today for follow up of the following chronic medical issues:  1. Mixed hyperlipidemia Does try to watch diet but does not do any exercise. Lab Results  Component Value Date   CHOL 154 07/27/2021   HDL 52 07/27/2021   LDLCALC 81 07/27/2021   TRIG 116 07/27/2021   CHOLHDL 3.0 07/27/2021     2. Hypothyroidism due to acquired atrophy of thyroid No problems that aware of Lab Results  Component Value Date   TSH 1.250 07/27/2021    3. Atypical meningioma of brain ([place]) Is currently having  no issues. No headaches or dizziness  4. Spondylolisthesis of lumbar region Pain assessment: Cause of pain- spondylolisthesis Pain location- lower back Pain on scale of 1-10- 3/10 What increases pain-to much activity What makes pain Better-rest helps Effects on ADL - none Any change in general medical condition-no  Current opioids rx- tylenol #3 # meds rx- 120 Effectiveness of current meds-helps Adverse reactions from pain meds-none Morphine equivalent- 10MME  Pill count performed-No Last drug screen - 04/30/21 ( high risk q54m moderate risk q640mlow risk yearly ) Urine drug screen today- No Was the [place]ed- yes  If yes were their any concerning findings? - no   Overdose risk: 1    10/14/2018   10:25 AM  Opioid Risk   Alcohol 0  Illegal Drugs 0  Rx Drugs 0  Alcohol 0  Illegal Drugs 0  Rx Drugs 0  Age between 16-45 years  0  History of Preadolescent Sexual Abuse 0  Psychological Disease 0  Depression 0  Opioid Risk Tool Scoring 0  Opioid Risk Interpretation Low Risk     Pain contract signed on: 07/31/21   5. BMI  31.0-31.9,adult No recent weight changes Wt Readings from Last 3 Encounters:  10/23/21 190 lb (86.2 kg)  07/27/21 190 lb (86.2 kg)  04/30/21 191 lb (86.6 kg)   BMI Readings from Last 3 Encounters:  10/23/21 31.62 kg/m  07/27/21 31.62 kg/m  04/30/21 31.78 kg/m      New complaints: none  Allergies  Allergen Reactions   Norco [Hydrocodone-Acetaminophen] Nausea Only   Outpatient Encounter Medications as of 10/23/2021  Medication Sig   acetaminophen-codeine (TYLENOL #3) 300-30 MG tablet Take 1 tablet by mouth every 6 (six) hours as needed for moderate pain.   albuterol (VENTOLIN HFA) 108 (90 Base) MCG/ACT inhaler Inhale 2 puffs into the lungs every 6 (six) hours as needed for wheezing or shortness of breath.   Ascorbic Acid (VITAMIN C) 500 MG tablet Take 500 mg by mouth daily.    aspirin 81 MG tablet Take 81 mg by mouth daily.     Biotin 10 MG CAPS Take by mouth.   Cholecalciferol (VITAMIN D) 2000 UNITS tablet Take 2,000 Units by mouth 2 (two) times daily.   COCONUT OIL PO Take by mouth.   cyanocobalamin 100 MCG tablet Take 100 mcg by mouth daily.   FLAXSEED, LINSEED, PO Take by mouth.   fluticasone (FLONASE) 50 MCG/ACT nasal spray Place 2 sprays into both nostrils daily. (Patient taking differently: Place 2 sprays into both  nostrils daily as needed.)   Glucosamine-Chondroitin (OSTEO BI-FLEX REGULAR STRENGTH PO) Take by mouth.   Inulin (FIBER CHOICE) 1.5 g CHEW Chew by mouth.   levothyroxine (SYNTHROID) 50 MCG tablet Take 1 tablet (50 mcg total) by mouth daily.   Multiple Vitamins-Minerals (CENTRUM SILVER PO) Take by mouth.     rosuvastatin (CRESTOR) 10 MG tablet Take 1 tablet (10 mg total) by mouth daily.   TURMERIC PO Take by mouth.   vitamin E 180 MG (400 UNITS) capsule Take 400 Units by mouth daily.   No facility-administered encounter medications on file as of 10/23/2021.    Past Surgical History:  Procedure Laterality Date   BRAIN SURGERY     BREAST SURGERY  AUG.  2010   right breast lumpectomy   BREAST SURGERY     Cancer   COLONOSCOPY     CRANIOTOMY Left 10/16/2015   Procedure: PARA-SAGITTAL CRANIOTOMY WITH STEREOTACTIC NAVIGATION FOR TUMOR;  Surgeon: Kevan Ny Ditty, MD;  Location: [facility];  Service: Neurosurgery;  Laterality: Left;   GANGLION CYST EXCISION  SEP. 1997   RIGHT WRIST    Family History  Problem Relation Age of Onset   Colon cancer Mother 48   Diabetes Father    Heart disease Father    Cancer Father        skin   Breast cancer Brother 57   Cancer Brother        bone mets   Leukemia Sister 44       CLL   Cancer Brother        skin   Ovarian cancer Maternal Aunt        dx in her 39s   Tuberculosis Maternal Grandmother    Heart disease Maternal Grandfather    Diabetes Paternal Grandmother    Alcohol abuse Paternal Grandfather    Esophageal cancer Neg Hx    Rectal cancer Neg Hx    Stomach cancer Neg Hx         Review of Systems  Constitutional:  Negative for diaphoresis.  Eyes:  Negative for pain.  Respiratory:  Negative for shortness of breath.   Cardiovascular:  Negative for chest pain, palpitations and leg swelling.  Gastrointestinal:  Negative for abdominal pain.  Endocrine: Negative for polydipsia.  Skin:  Negative for rash.  Neurological:  Negative for dizziness, weakness and headaches.  Hematological:  Does not bruise/bleed easily.  All other systems reviewed and are negative.      Objective:   Physical Exam Vitals and nursing note reviewed.  Constitutional:      General: Julia Poole is not in acute distress.    Appearance: Normal appearance. Julia Poole is well-developed.  HENT:     Head: Normocephalic.     Right Ear: Tympanic membrane normal.     Left Ear: Tympanic membrane normal.     Nose: Nose normal.     Mouth/Throat:     Mouth: Mucous membranes are moist.  Eyes:     Pupils: Pupils are equal, round, and reactive to light.  Neck:     Vascular: No carotid bruit or JVD.  Cardiovascular:      Rate and Rhythm: Normal rate and regular rhythm.     Heart sounds: Normal heart sounds.  Pulmonary:     Effort: Pulmonary effort is normal. No respiratory distress.     Breath sounds: Normal breath sounds. No wheezing or rales.  Chest:     Chest Belle: No tenderness.  Abdominal:  General: Bowel sounds are normal. There is no distension or abdominal bruit.     Palpations: Abdomen is soft. There is no hepatomegaly, splenomegaly, mass or pulsatile mass.     Tenderness: There is no abdominal tenderness.  Musculoskeletal:        General: Normal range of motion.     Cervical back: Normal range of motion and neck supple.  Lymphadenopathy:     Cervical: No cervical adenopathy.  Skin:    General: Skin is warm and dry.  Neurological:     Mental Status: Julia Poole is alert and oriented to person, place, and time.     Deep Tendon Reflexes: Reflexes are normal and symmetric.  Psychiatric:        Behavior: Behavior normal.        Thought Content: Thought content normal.        Judgment: Judgment normal.    BP 131/76   Pulse 68   Temp 98 F (36.7 C) (Temporal)   Resp 20   Ht _0  (1.651 m)   Wt 190 lb (86.2 kg)   SpO2 100%   BMI 31.62 kg/m         Assessment & Plan:   Taria Castrillo Privitera comes in today with chief complaint of Medical Management of Chronic Issues   Diagnosis and orders addressed:  1. Mixed hyperlipidemia Low sodium diet - rosuvastatin (CRESTOR) 10 MG tablet; Take 1 tablet (10 mg total) by mouth daily.  Dispense: 90 tablet; Refill: 1 - CBC with Differential/Platelet - CMP14+EGFR - Lipid panel  2. Hypothyroidism due to acquired atrophy of thyroid Labs pending - levothyroxine (SYNTHROID) 50 MCG tablet; Take 1 tablet (50 mcg total) by mouth daily.  Dispense: 90 tablet; Refill: 1 - Thyroid Panel With TSH  3. Atypical meningioma of brain ([place]) Report any headaches, dizziness , blurred vision, or altered mental status  4. Spondylolisthesis of lumbar region Back  stretches - acetaminophen-codeine (TYLENOL #3) 300-30 MG tablet; Take 1 tablet by mouth every 4 (four) hours as needed for moderate pain.  Dispense: 120 tablet; Refill: 0 - acetaminophen-codeine (TYLENOL #3) 300-30 MG tablet; Take 1 tablet by mouth every 4 (four) hours as needed for moderate pain.  Dispense: 120 tablet; Refill: 0 - acetaminophen-codeine (TYLENOL #3) 300-30 MG tablet; Take 1 tablet by mouth every 4 (four) hours as needed for moderate pain.  Dispense: 120 tablet; Refill: 0  5. BMI 31.0-31.9,adult Discussed diet and exercise for person with BMI >25 Will recheck weight in 3-6 months    Labs pending Health Maintenance reviewed Diet and exercise encouraged  Follow up plan: 3 months   [name] Done, FNP

## 2021-10-24 ENCOUNTER — Encounter: Payer: Self-pay | Admitting: Gastroenterology

## 2021-11-09 ENCOUNTER — Ambulatory Visit (AMBULATORY_SURGERY_CENTER): Payer: Medicare Other

## 2021-11-09 VITALS — Ht 65.0 in | Wt 190.0 lb

## 2021-11-09 DIAGNOSIS — Z800 Family history of malignant neoplasm of digestive organs: Secondary | ICD-10-CM

## 2021-11-09 DIAGNOSIS — Z86010 Personal history of colonic polyps: Secondary | ICD-10-CM

## 2021-11-09 MED ORDER — NA SULFATE-K SULFATE-MG SULF 17.5-3.13-1.6 GM/177ML PO SOLN: 1.0000 | ORAL | 0 refills | Status: DC

## 2021-11-09 NOTE — Progress Notes (Signed)
No egg or soy allergy known to patient  No issues known to pt with past sedation with any surgeries or procedures Patient denies ever being told they had issues or difficulty with intubation  No FH of Malignant Hyperthermia Pt is not on diet pills Pt is not on  home 02  Pt is not on blood thinners  Pt denies issues with constipation  No A fib or A flutter Have any cardiac testing pending--denied Pt instructed to use [email or web address] or GoodRx for a price reduction on prep   

## 2021-12-07 ENCOUNTER — Ambulatory Visit (AMBULATORY_SURGERY_CENTER): Payer: Medicare Other | Admitting: Gastroenterology

## 2021-12-07 ENCOUNTER — Encounter: Payer: Self-pay | Admitting: Gastroenterology

## 2021-12-07 VITALS — BP 120/77 | HR 67 | Temp 98.4°F | Resp 13 | Ht 65.0 in | Wt 190.0 lb

## 2021-12-07 DIAGNOSIS — Z86010 Personal history of colonic polyps: Secondary | ICD-10-CM

## 2021-12-07 DIAGNOSIS — Z800 Family history of malignant neoplasm of digestive organs: Secondary | ICD-10-CM | POA: Diagnosis not present

## 2021-12-07 DIAGNOSIS — Z09 Encounter for follow-up examination after completed treatment for conditions other than malignant neoplasm: Secondary | ICD-10-CM

## 2021-12-07 MED ORDER — SODIUM CHLORIDE 0.9 % IV SOLN: 500.0000 mL | Freq: Once | INTRAVENOUS | Status: AC

## 2021-12-07 NOTE — Patient Instructions (Signed)
YOU HAD AN ENDOSCOPIC PROCEDURE TODAY AT [facility]:   Refer to the procedure report that was given to you for any specific questions about what was found during the examination.  If the procedure report does not answer your questions, please call your gastroenterologist to clarify.  If you requested that your care partner not be given the details of your procedure findings, then the procedure report has been included in a sealed envelope for you to review at your convenience later.  YOU SHOULD EXPECT: Some feelings of bloating in the abdomen. Passage of more gas than usual.  Walking can help get rid of the air that was put into your GI tract during the procedure and reduce the bloating. If you had a lower endoscopy (such as a colonoscopy or flexible sigmoidoscopy) you may notice spotting of blood in your stool or on the toilet paper. If you underwent a bowel prep for your procedure, you may not have a normal bowel movement for a few days.  Please Note:  You might notice some irritation and congestion in your nose or some drainage.  This is from the oxygen used during your procedure.  There is no need for concern and it should clear up in a day or so.  SYMPTOMS TO REPORT IMMEDIATELY:  Following lower endoscopy (colonoscopy or flexible sigmoidoscopy):  Excessive amounts of blood in the stool  Significant tenderness or worsening of abdominal pains  Swelling of the abdomen that is new, acute  Fever of 100F or higher   For urgent or emergent issues, a gastroenterologist can be reached at any hour by calling [phone]. Do not use MyChart messaging for urgent concerns.    DIET:  We do recommend a small meal at first, but then you may proceed to your regular diet.  Drink plenty of fluids but you should avoid alcoholic beverages for 24 hours.  MEDICATIONS: Continue present medications.  Please see handouts given to you by your recovery nurse.  Thank you for allowing [place] to  provide for your healthcare needs today.  ACTIVITY:  You should plan to take it easy for the rest of today and you should NOT DRIVE or use heavy machinery until tomorrow (because of the sedation medicines used during the test).    FOLLOW UP: Our staff will call the number listed on your records the next business day following your procedure.  We will call around 7:15- 8:00 am to check on you and address any questions or concerns that you may have regarding the information given to you following your procedure. If we do not reach you, we will leave a message.  If you develop any symptoms (ie: fever, flu-like symptoms, shortness of breath, cough etc.) before then, please call [phone].  If you test positive for Covid 19 in the 2 weeks post procedure, please call and report this information to [place].    If any biopsies were taken you will be contacted by phone or by letter within the next 1-3 weeks.  Please call us at [phone] if you have not heard about the biopsies in 3 weeks.    SIGNATURES/CONFIDENTIALITY: You and/or your care partner have signed paperwork which will be entered into your electronic medical record.  These signatures attest to the fact that that the information above on your After Visit Summary has been reviewed and is understood.  Full responsibility of the confidentiality of this discharge information lies with you and/or your care-partner.

## 2021-12-07 NOTE — Op Note (Signed)
[facility] Patient Name: Julia Poole Procedure Date: 12/07/2021 9:01 AM MRN: [id number] Endoscopist: Remo Lipps P. Havery Moros , MD Age: 66 Referring MD:  Date of Birth: 06/20/1955 Gender: Female Account #: [id number] Procedure:                Colonoscopy Indications:              High risk colon cancer surveillance: Personal                            history of colonic polyps - SSPs removed 07/2016,                            mother with colon cancer dx age 2s Medicines:                Monitored Anesthesia Care Procedure:                Pre-Anesthesia Assessment:                           - Prior to the procedure, a History and Physical                            was performed, and patient medications and                            allergies were reviewed. The patient's tolerance of                            previous anesthesia was also reviewed. The risks                            and benefits of the procedure and the sedation                            options and risks were discussed with the patient.                            All questions were answered, and informed consent                            was obtained. Prior Anticoagulants: The patient has                            taken no previous anticoagulant or antiplatelet                            agents. ASA Grade Assessment: III - A patient with                            severe systemic disease. After reviewing the risks                            and benefits, the patient was deemed in  satisfactory condition to undergo the procedure.                           After obtaining informed consent, the colonoscope                            was passed under direct vision. Throughout the                            procedure, the patient's blood pressure, pulse, and                            oxygen saturations were monitored continuously. The                            Olympus PCF-H190DL  ([id number]) Colonoscope was                            introduced through the anus and advanced to the the                            cecum, identified by appendiceal orifice and                            ileocecal valve. The colonoscopy was performed                            without difficulty. The patient tolerated the                            procedure well. The quality of the bowel                            preparation was good. The ileocecal valve,                            appendiceal orifice, and rectum were photographed. Scope In: 9:05:20 AM Scope Out: 9:22:19 AM Scope Withdrawal Time: 0 hours 13 minutes 8 seconds  Total Procedure Duration: 0 hours 16 minutes 59 seconds  Findings:                 The perianal and digital rectal examinations were                            normal.                           The colon was tortuous.                           Internal hemorrhoids were found during                            retroflexion. The hemorrhoids were small.  The exam was otherwise without abnormality. No                            polyps Complications:            No immediate complications. Estimated blood loss:                            None. Estimated Blood Loss:     Estimated blood loss: none. Impression:               - Tortuous colon.                           - Internal hemorrhoids.                           - The examination was otherwise normal.                           - No polyps Recommendation:           - Patient has a contact number available for                            emergencies. The signs and symptoms of potential                            delayed complications were discussed with the                            patient. Return to normal activities tomorrow.                            Written discharge instructions were provided to the                            patient.                           - Resume previous  diet.                           - Continue present medications.                           - Repeat colonoscopy in 5 years for surveillance. Remo Lipps P. Kazi Reppond, MD 12/07/2021 9:29:56 AM This report has been signed electronically.

## 2021-12-07 NOTE — Progress Notes (Signed)
[facility] History and Physical   Primary Care Physician:  Chevis Pretty, FNP   Reason for Procedure:   History of colon polyps, mother with history of colon cancer  Plan:    colonoscopy     HPI: Julia Poole is a 66 y.o. female  here for colonoscopy surveillance - history of polyps removed 07/2016 - SSP. Mother had CRC age 20-60s. Patient denies any bowel symptoms at this time. No family history of colon cancer known. Otherwise feels well without any cardiopulmonary symptoms.   I have discussed risks / benefits of anesthesia and endoscopic procedure with Renie Ora Frederic and they wish to proceed with the exams as outlined today.    Past Medical History:  Diagnosis Date   Allergy    Arthritis    Bleeding nose    Cancer ([place]) 2010   right breast   Cancer ([place]) 09/2015   brain   Cataract    Degenerative disk disease 2012   Family history of breast cancer in female    Family history of colon cancer    Family history of ovarian cancer    Hemorrhoids    Hyperlipidemia    Sinus problem    Thyroid disease     Past Surgical History:  Procedure Laterality Date   BRAIN SURGERY     BREAST SURGERY  AUG. 2010   right breast lumpectomy   BREAST SURGERY     Cancer   COLONOSCOPY     CRANIOTOMY Left 10/16/2015   Procedure: PARA-SAGITTAL CRANIOTOMY WITH STEREOTACTIC NAVIGATION FOR TUMOR;  Surgeon: Kevan Ny Ditty, MD;  Location: [facility];  Service: Neurosurgery;  Laterality: Left;   GANGLION CYST EXCISION  SEP. 1997   RIGHT WRIST    Prior to Admission medications   Medication Sig Start Date End Date Taking? Authorizing Provider  acetaminophen-codeine (TYLENOL #3) 300-30 MG tablet Take 1 tablet by mouth every 4 (four) hours as needed for moderate pain. 12/22/21 01/21/22 Yes Martin, Mary-Margaret, FNP  acetaminophen-codeine (TYLENOL #3) 300-30 MG tablet Take 1 tablet by mouth every 4 (four) hours as needed for moderate pain. 11/22/21 12/22/21 Yes Martin,  Mary-Margaret, FNP  Ascorbic Acid (VITAMIN C) 500 MG tablet Take 500 mg by mouth daily.    Yes [provider]  aspirin 81 MG tablet Take 81 mg by mouth daily.     Yes [provider]  Biotin 10 MG CAPS Take by mouth.   Yes [provider]  Boswellia-Glucosamine-Vit D (OSTEO BI-FLEX ONE PER DAY PO) Take by mouth.   Yes [provider]  Calcium Carb-Cholecalciferol (CALTRATE 600+D3 PO) Take by mouth.   Yes [provider]  Cholecalciferol (VITAMIN D) 2000 UNITS tablet Take 2,000 Units by mouth 2 (two) times daily.   Yes [provider]  COCONUT OIL PO Take by mouth.   Yes [provider]  Cyanocobalamin (VITAMIN B 12 PO) Take by mouth.   Yes [provider]  FLAXSEED, LINSEED, PO Take by mouth.   Yes [provider]  Glucosamine-Chondroitin (OSTEO BI-FLEX REGULAR STRENGTH PO) Take by mouth.   Yes [provider]  levothyroxine (SYNTHROID) 50 MCG tablet Take 1 tablet (50 mcg total) by mouth daily. 10/23/21  Yes Hassell Done, Mary-Margaret, FNP  Multiple Vitamins-Minerals (CENTRUM SILVER PO) Take by mouth.     Yes [provider]  pseudoephedrine (SUDAFED) 30 MG tablet Take 30 mg by mouth every 4 (four) hours as needed for congestion.   Yes [provider]  rosuvastatin (CRESTOR) 10 MG tablet Take 1 tablet (10 mg total) by mouth daily. 10/23/21  Yes Hassell Done, Mary-Margaret, FNP  TURMERIC PO Take by mouth.   Yes [provider]  vitamin E 180 MG (400 UNITS) capsule Take 400 Units by mouth daily.   Yes [provider]  albuterol (VENTOLIN HFA) 108 (90 Base) MCG/ACT inhaler Inhale 2 puffs into the lungs every 6 (six) hours as needed for wheezing or shortness of breath. 04/16/21   Sharion Balloon, FNP  fluticasone (FLONASE) 50 MCG/ACT nasal spray Place 2 sprays into both nostrils daily. Patient not taking: Reported on 12/07/2021 04/18/18   Baruch Gouty, FNP  Inulin (FIBER CHOICE) 1.5 g CHEW  Chew by mouth.    [provider]    Current Outpatient Medications  Medication Sig Dispense Refill   [START ON 12/22/2021] acetaminophen-codeine (TYLENOL #3) 300-30 MG tablet Take 1 tablet by mouth every 4 (four) hours as needed for moderate pain. 120 tablet 0   acetaminophen-codeine (TYLENOL #3) 300-30 MG tablet Take 1 tablet by mouth every 4 (four) hours as needed for moderate pain. 120 tablet 0   Ascorbic Acid (VITAMIN C) 500 MG tablet Take 500 mg by mouth daily.      aspirin 81 MG tablet Take 81 mg by mouth daily.       Biotin 10 MG CAPS Take by mouth.     Boswellia-Glucosamine-Vit D (OSTEO BI-FLEX ONE PER DAY PO) Take by mouth.     Calcium Carb-Cholecalciferol (CALTRATE 600+D3 PO) Take by mouth.     Cholecalciferol (VITAMIN D) 2000 UNITS tablet Take 2,000 Units by mouth 2 (two) times daily.     COCONUT OIL PO Take by mouth.     Cyanocobalamin (VITAMIN B 12 PO) Take by mouth.     FLAXSEED, LINSEED, PO Take by mouth.     Glucosamine-Chondroitin (OSTEO BI-FLEX REGULAR STRENGTH PO) Take by mouth.     levothyroxine (SYNTHROID) 50 MCG tablet Take 1 tablet (50 mcg total) by mouth daily. 90 tablet 1   Multiple Vitamins-Minerals (CENTRUM SILVER PO) Take by mouth.       pseudoephedrine (SUDAFED) 30 MG tablet Take 30 mg by mouth every 4 (four) hours as needed for congestion.     rosuvastatin (CRESTOR) 10 MG tablet Take 1 tablet (10 mg total) by mouth daily. 90 tablet 1   TURMERIC PO Take by mouth.     vitamin E 180 MG (400 UNITS) capsule Take 400 Units by mouth daily.     albuterol (VENTOLIN HFA) 108 (90 Base) MCG/ACT inhaler Inhale 2 puffs into the lungs every 6 (six) hours as needed for wheezing or shortness of breath. 8 g 0   fluticasone (FLONASE) 50 MCG/ACT nasal spray Place 2 sprays into both nostrils daily. (Patient not taking: Reported on 12/07/2021) 16 g 6   Inulin (FIBER CHOICE) 1.5 g CHEW Chew by mouth.     Current Facility-Administered Medications  Medication Dose Route  Frequency Provider Last Rate Last Admin   0.9 %  sodium chloride infusion  500 mL Intravenous Once Raquan Iannone, Carlota Raspberry, MD        Allergies as of 12/07/2021 - Review Complete 12/07/2021  Allergen Reaction Noted   Norco [hydrocodone-acetaminophen] Nausea Only 11/21/2015    Family History  Problem Relation Age of Onset   Colon polyps Mother    Colon cancer Mother 59   Diabetes Father    Heart disease Father    Cancer Father  skin   Colon polyps Sister    Leukemia Sister 72       CLL   Breast cancer Brother 50   Cancer Brother        bone mets   Cancer Brother        skin   Ovarian cancer Maternal Aunt        dx in her 8s   Tuberculosis Maternal Grandmother    Heart disease Maternal Grandfather    Diabetes Paternal Grandmother    Alcohol abuse Paternal Grandfather    Esophageal cancer Neg Hx    Rectal cancer Neg Hx    Stomach cancer Neg Hx     Social History   Socioeconomic History   Marital status: Married    Spouse name: Jeneen Rinks   Number of children: 1   Years of education: 12   Highest education level: 12th grade  Occupational History   Occupation: Librarian, academic. [employer]  Tobacco Use   Smoking status: Never   Smokeless tobacco: Never  Vaping Use   Vaping Use: Never used  Substance and Sexual Activity   Alcohol use: No   Drug use: No   Sexual activity: Yes    Birth control/protection: Post-menopausal  Other Topics Concern   Not on file  Social History Narrative   Married 27 years on 01/18/2021.   1 son and 2 step children, son and daughter.   Social Determinants of Health   Financial Resource Strain: Low Risk  (02/14/2021)   Overall Financial Resource Strain (CARDIA)    Difficulty of Paying Living Expenses: Not hard at all  Food Insecurity: No Food Insecurity (02/14/2021)   Hunger Vital Sign    Worried About Running Out of Food in the Last Year: Never true    Ran Out of Food in the Last Year: Never true  Transportation Needs: No Transportation Needs  (02/14/2021)   PRAPARE - Hydrologist (Medical): No    Lack of Transportation (Non-Medical): No  Physical Activity: Sufficiently Active (02/14/2021)   Exercise Vital Sign    Days of Exercise per Week: 5 days    Minutes of Exercise per Session: 30 min  Stress: No Stress Concern Present (02/14/2021)   [place]    Feeling of Stress : Not at all  Social Connections: [place] (02/14/2021)   Social Connection and Isolation Panel [NHANES]    Frequency of Communication with Friends and Family: More than three times a week    Frequency of Social Gatherings with Friends and Family: More than three times a week    Attends Religious Services: 1 to 4 times per year    Active Member of [employer] or Organizations: Yes    Attends Archivist Meetings: 1 to 4 times per year    Marital Status: Married  Human resources officer Violence: Not At Risk (02/14/2021)   Humiliation, Afraid, Rape, and Kick questionnaire    Fear of Current or Ex-Partner: No    Emotionally Abused: No    Physically Abused: No    Sexually Abused: No    Review of Systems: All other review of systems negative except as mentioned in the HPI.  Physical Exam: Vital signs BP 139/62   Pulse 68   Temp 98.4 F (36.9 C)   Ht '5\' 5"'$  (1.651 m)   Wt 190 lb (86.2 kg)   SpO2 100%   BMI 31.62 kg/m   General:   Alert,  Well-developed, pleasant and cooperative in NAD Lungs:  Clear throughout to auscultation.   Heart:  Regular rate and rhythm Abdomen:  Soft, nontender and nondistended.   Neuro/Psych:  Alert and cooperative. Normal mood and affect. A and O x 3  Jolly Mango, MD [facility]

## 2021-12-07 NOTE — Progress Notes (Signed)
Report to PACU, RN, vss, BBS= Clear.  

## 2021-12-07 NOTE — Progress Notes (Signed)
Pt's states no medical or surgical changes since previsit or office visit. 

## 2021-12-10 ENCOUNTER — Telehealth: Payer: Self-pay

## 2021-12-10 NOTE — Telephone Encounter (Signed)
  Follow up Call-     12/07/2021    8:23 AM  Call back number  Post procedure Call Back phone  # [phone]  Permission to leave phone message Yes     Patient questions:  Do you have a fever, pain , or abdominal swelling? No. Pain Score  0 *  Have you tolerated food without any problems? Yes.    Have you been able to return to your normal activities? Yes.    Do you have any questions about your discharge instructions: Diet   No. Medications  No. Follow up visit  No.  Do you have questions or concerns about your Care? No.  Actions: * If pain score is 4 or above: No action needed, pain <4.

## 2022-01-01 DIAGNOSIS — Z1231 Encounter for screening mammogram for malignant neoplasm of breast: Secondary | ICD-10-CM | POA: Diagnosis not present

## 2022-01-04 ENCOUNTER — Encounter: Payer: Self-pay | Admitting: Family Medicine

## 2022-01-04 ENCOUNTER — Ambulatory Visit (INDEPENDENT_AMBULATORY_CARE_PROVIDER_SITE_OTHER): Payer: Medicare Other | Admitting: Family Medicine

## 2022-01-04 DIAGNOSIS — A084 Viral intestinal infection, unspecified: Secondary | ICD-10-CM

## 2022-01-04 MED ORDER — ONDANSETRON 4 MG PO TBDP: 4.0000 mg | ORAL_TABLET | Freq: Three times a day (TID) | ORAL | 0 refills | Status: DC | PRN

## 2022-01-04 NOTE — Progress Notes (Signed)
Virtual Visit via telephone Note  I connected with Julia Poole on 01/04/22 at 0957 by telephone and verified that I am speaking with the correct person using two identifiers. Julia Poole is currently located at home and patient are currently with her during visit. The provider, Fransisca Kaufmann Ruqayya Ventress, MD is located in their office at time of visit.  Call ended at [phone]  I discussed the limitations, risks, security and privacy concerns of performing an evaluation and management service by telephone and the availability of in person appointments. I also discussed with the patient that there may be a patient responsible charge related to this service. The patient expressed understanding and agreed to proceed.   History and Present Illness: Patient is calling in for sharp pain in her left side since 2 am this morning.  She drinks fluids and she vomits clear liquids.  She denies diarrhea or constipation.  She denies blood in vomit or stool. She had some antacids. She has recently kept down grape juice. She is feeling nauseated again. She denies sick contacts that she knows of but she was at a reunion this last weekend.   1. Viral gastroenteritis     Outpatient Encounter Medications as of 01/04/2022  Medication Sig   ondansetron (ZOFRAN-ODT) 4 MG disintegrating tablet Take 1 tablet (4 mg total) by mouth every 8 (eight) hours as needed for nausea or vomiting.   acetaminophen-codeine (TYLENOL #3) 300-30 MG tablet Take 1 tablet by mouth every 4 (four) hours as needed for moderate pain.   albuterol (VENTOLIN HFA) 108 (90 Base) MCG/ACT inhaler Inhale 2 puffs into the lungs every 6 (six) hours as needed for wheezing or shortness of breath.   Ascorbic Acid (VITAMIN C) 500 MG tablet Take 500 mg by mouth daily.    aspirin 81 MG tablet Take 81 mg by mouth daily.     Biotin 10 MG CAPS Take by mouth.   Boswellia-Glucosamine-Vit D (OSTEO BI-FLEX ONE PER DAY PO) Take by mouth.   Calcium Carb-Cholecalciferol  (CALTRATE 600+D3 PO) Take by mouth.   Cholecalciferol (VITAMIN D) 2000 UNITS tablet Take 2,000 Units by mouth 2 (two) times daily.   COCONUT OIL PO Take by mouth.   Cyanocobalamin (VITAMIN B 12 PO) Take by mouth.   FLAXSEED, LINSEED, PO Take by mouth.   fluticasone (FLONASE) 50 MCG/ACT nasal spray Place 2 sprays into both nostrils daily. (Patient not taking: Reported on 12/07/2021)   Glucosamine-Chondroitin (OSTEO BI-FLEX REGULAR STRENGTH PO) Take by mouth.   Inulin (FIBER CHOICE) 1.5 g CHEW Chew by mouth.   levothyroxine (SYNTHROID) 50 MCG tablet Take 1 tablet (50 mcg total) by mouth daily.   Multiple Vitamins-Minerals (CENTRUM SILVER PO) Take by mouth.     pseudoephedrine (SUDAFED) 30 MG tablet Take 30 mg by mouth every 4 (four) hours as needed for congestion.   rosuvastatin (CRESTOR) 10 MG tablet Take 1 tablet (10 mg total) by mouth daily.   TURMERIC PO Take by mouth.   vitamin E 180 MG (400 UNITS) capsule Take 400 Units by mouth daily.   Facility-Administered Encounter Medications as of 01/04/2022  Medication   0.9 %  sodium chloride infusion    Review of Systems  Constitutional:  Negative for chills and fever.  Eyes:  Negative for visual disturbance.  Respiratory:  Negative for chest tightness and shortness of breath.   Cardiovascular:  Negative for chest pain and leg swelling.  Gastrointestinal:  Positive for abdominal pain, nausea and vomiting. Negative for  constipation and diarrhea.  Musculoskeletal:  Negative for back pain and gait problem.  Skin:  Negative for rash.  Neurological:  Negative for light-headedness and headaches.  Psychiatric/Behavioral:  Negative for agitation and behavioral problems.   All other systems reviewed and are negative.   Observations/Objective: Patient sounds comfortable and in no acute distress  Assessment and Plan: Problem List Items Addressed This Visit   None Visit Diagnoses     Viral gastroenteritis    -  Primary   Relevant Medications    ondansetron (ZOFRAN-ODT) 4 MG disintegrating tablet       We will send Zofran for her, likely viral gastroenteritis based on what she is saying.  That he worsens or does not improve give [place] call back or go to the urgent care or emergency department. Follow up plan: Return if symptoms worsen or fail to improve.     I discussed the assessment and treatment plan with the patient. The patient was provided an opportunity to ask questions and all were answered. The patient agreed with the plan and demonstrated an understanding of the instructions.   The patient was advised to call back or seek an in-person evaluation if the symptoms worsen or if the condition fails to improve as anticipated.  The above assessment and management plan was discussed with the patient. The patient verbalized understanding of and has agreed to the management plan. Patient is aware to call the clinic if symptoms persist or worsen. Patient is aware when to return to the clinic for a follow-up visit. Patient educated on when it is appropriate to go to the emergency department.    I provided 7 minutes of non-face-to-face time during this encounter.    Worthy Rancher, MD

## 2022-01-11 DIAGNOSIS — R921 Mammographic calcification found on diagnostic imaging of breast: Secondary | ICD-10-CM | POA: Diagnosis not present

## 2022-01-11 DIAGNOSIS — R922 Inconclusive mammogram: Secondary | ICD-10-CM | POA: Diagnosis not present

## 2022-01-11 DIAGNOSIS — R928 Other abnormal and inconclusive findings on diagnostic imaging of breast: Secondary | ICD-10-CM | POA: Diagnosis not present

## 2022-01-21 ENCOUNTER — Other Ambulatory Visit: Payer: Self-pay | Admitting: Radiology

## 2022-01-21 DIAGNOSIS — D0511 Intraductal carcinoma in situ of right breast: Secondary | ICD-10-CM | POA: Diagnosis not present

## 2022-01-24 ENCOUNTER — Ambulatory Visit (INDEPENDENT_AMBULATORY_CARE_PROVIDER_SITE_OTHER): Payer: Medicare Other | Admitting: Nurse Practitioner

## 2022-01-24 ENCOUNTER — Encounter: Payer: Self-pay | Admitting: Nurse Practitioner

## 2022-01-24 VITALS — BP 117/66 | HR 76 | Temp 97.5°F | Resp 20 | Ht 65.0 in | Wt 188.0 lb

## 2022-01-24 DIAGNOSIS — C50911 Malignant neoplasm of unspecified site of right female breast: Secondary | ICD-10-CM

## 2022-01-24 DIAGNOSIS — E034 Atrophy of thyroid (acquired): Secondary | ICD-10-CM | POA: Diagnosis not present

## 2022-01-24 DIAGNOSIS — D420 Neoplasm of uncertain behavior of cerebral meninges: Secondary | ICD-10-CM | POA: Diagnosis not present

## 2022-01-24 DIAGNOSIS — Z23 Encounter for immunization: Secondary | ICD-10-CM | POA: Diagnosis not present

## 2022-01-24 DIAGNOSIS — E782 Mixed hyperlipidemia: Secondary | ICD-10-CM | POA: Diagnosis not present

## 2022-01-24 DIAGNOSIS — Z6831 Body mass index (BMI) 31.0-31.9, adult: Secondary | ICD-10-CM

## 2022-01-24 MED ORDER — ROSUVASTATIN CALCIUM 10 MG PO TABS: 10.0000 mg | ORAL_TABLET | Freq: Every day | ORAL | 1 refills | Status: DC

## 2022-01-24 MED ORDER — LEVOTHYROXINE SODIUM 50 MCG PO TABS: 50.0000 ug | ORAL_TABLET | Freq: Every day | ORAL | 1 refills | Status: DC

## 2022-01-24 NOTE — Progress Notes (Signed)
Subjective:    Patient ID: Julia Poole, female    DOB: 1955-05-29, 66 y.o.   MRN: [id number]   Chief Complaint: medical management of chronic issues     HPI:  Julia Poole is a 66 y.o. who identifies as a female who was assigned female at birth.   Social history: Lives with: husband Work history: retired   Scientist, forensic in today for follow up of the following chronic medical issues:  1. Mixed hyperlipidemia Does try to watch diet. Does no dedicated exercise. Lab Results  Component Value Date   CHOL 154 07/27/2021   HDL 52 07/27/2021   LDLCALC 81 07/27/2021   TRIG 116 07/27/2021   CHOLHDL 3.0 07/27/2021     2. Hypothyroidism due to acquired atrophy of thyroid No problems that she is aware. Lab Results  Component Value Date   TSH 1.250 07/27/2021     3. Atypical meningioma of brain [facility]) Has had no issues since her surgery over 5 years ago  4. Malignant neoplasm of right female breast, unspecified estrogen receptor status, unspecified site of breast [facility]) Patient had mammogram last week and had calcifications. They are saying that stage 0 cancer. She is going to have an appointment next Friday with surgeon to see what to do.   5. BMI 31.0-31.9,adult No recent weight changes Wt Readings from Last 3 Encounters:  01/24/22 188 lb (85.3 kg)  12/07/21 190 lb (86.2 kg)  11/09/21 190 lb (86.2 kg)   BMI Readings from Last 3 Encounters:  01/24/22 31.28 kg/m  12/07/21 31.62 kg/m  11/09/21 31.62 kg/m     New complaints: None today  Allergies  Allergen Reactions   Norco [Hydrocodone-Acetaminophen] Nausea Only   Outpatient Encounter Medications as of 01/24/2022  Medication Sig   albuterol (VENTOLIN HFA) 108 (90 Base) MCG/ACT inhaler Inhale 2 puffs into the lungs every 6 (six) hours as needed for wheezing or shortness of breath.   Ascorbic Acid (VITAMIN C) 500 MG tablet Take 500 mg by mouth daily.    aspirin 81 MG tablet Take 81 mg by mouth daily.     Biotin 10  MG CAPS Take by mouth.   Boswellia-Glucosamine-Vit D (OSTEO BI-FLEX ONE PER DAY PO) Take by mouth.   Calcium Carb-Cholecalciferol (CALTRATE 600+D3 PO) Take by mouth.   Cholecalciferol (VITAMIN D) 2000 UNITS tablet Take 2,000 Units by mouth 2 (two) times daily.   COCONUT OIL PO Take by mouth.   Cyanocobalamin (VITAMIN B 12 PO) Take by mouth.   FLAXSEED, LINSEED, PO Take by mouth.   fluticasone (FLONASE) 50 MCG/ACT nasal spray Place 2 sprays into both nostrils daily. (Patient not taking: Reported on 12/07/2021)   Glucosamine-Chondroitin (OSTEO BI-FLEX REGULAR STRENGTH PO) Take by mouth.   Inulin (FIBER CHOICE) 1.5 g CHEW Chew by mouth.   levothyroxine (SYNTHROID) 50 MCG tablet Take 1 tablet (50 mcg total) by mouth daily.   Multiple Vitamins-Minerals (CENTRUM SILVER PO) Take by mouth.     ondansetron (ZOFRAN-ODT) 4 MG disintegrating tablet Take 1 tablet (4 mg total) by mouth every 8 (eight) hours as needed for nausea or vomiting.   pseudoephedrine (SUDAFED) 30 MG tablet Take 30 mg by mouth every 4 (four) hours as needed for congestion.   rosuvastatin (CRESTOR) 10 MG tablet Take 1 tablet (10 mg total) by mouth daily.   TURMERIC PO Take by mouth.   vitamin E 180 MG (400 UNITS) capsule Take 400 Units by mouth daily.   Facility-Administered Encounter Medications as of  01/24/2022  Medication   0.9 %  sodium chloride infusion    Past Surgical History:  Procedure Laterality Date   BRAIN SURGERY     BREAST SURGERY  AUG. 2010   right breast lumpectomy   BREAST SURGERY     Cancer   COLONOSCOPY     CRANIOTOMY Left 10/16/2015   Procedure: PARA-SAGITTAL CRANIOTOMY WITH STEREOTACTIC NAVIGATION FOR TUMOR;  Surgeon: Kevan Ny Ditty, MD;  Location: [facility];  Service: Neurosurgery;  Laterality: Left;   GANGLION CYST EXCISION  SEP. 1997   RIGHT WRIST    Family History  Problem Relation Age of Onset   Colon polyps Mother    Colon cancer Mother 8   Diabetes Father    Heart disease Father     Cancer Father        skin   Colon polyps Sister    Leukemia Sister 49       CLL   Breast cancer Brother 94   Cancer Brother        bone mets   Cancer Brother        skin   Ovarian cancer Maternal Aunt        dx in her 26s   Tuberculosis Maternal Grandmother    Heart disease Maternal Grandfather    Diabetes Paternal Grandmother    Alcohol abuse Paternal Grandfather    Esophageal cancer Neg Hx    Rectal cancer Neg Hx    Stomach cancer Neg Hx       Controlled substance contract: n/a     Review of Systems  Constitutional:  Negative for diaphoresis.  Eyes:  Negative for pain.  Respiratory:  Negative for shortness of breath.   Cardiovascular:  Negative for chest pain, palpitations and leg swelling.  Gastrointestinal:  Negative for abdominal pain.  Endocrine: Negative for polydipsia.  Skin:  Negative for rash.  Neurological:  Negative for dizziness, weakness and headaches.  Hematological:  Does not bruise/bleed easily.  All other systems reviewed and are negative.      Objective:   Physical Exam Vitals and nursing note reviewed.  Constitutional:      General: She is not in acute distress.    Appearance: Normal appearance. She is well-developed.  HENT:     Head: Normocephalic.     Right Ear: Tympanic membrane normal.     Left Ear: Tympanic membrane normal.     Nose: Nose normal.     Mouth/Throat:     Mouth: Mucous membranes are moist.  Eyes:     Pupils: Pupils are equal, round, and reactive to light.  Neck:     Vascular: No carotid bruit or JVD.  Cardiovascular:     Rate and Rhythm: Normal rate and regular rhythm.     Heart sounds: Normal heart sounds.  Pulmonary:     Effort: Pulmonary effort is normal. No respiratory distress.     Breath sounds: Normal breath sounds. No wheezing or rales.  Chest:     Chest Steel: No tenderness.  Abdominal:     General: Bowel sounds are normal. There is no distension or abdominal bruit.     Palpations: Abdomen is soft.  There is no hepatomegaly, splenomegaly, mass or pulsatile mass.     Tenderness: There is no abdominal tenderness.  Musculoskeletal:        General: Normal range of motion.     Cervical back: Normal range of motion and neck supple.  Lymphadenopathy:     Cervical: No  cervical adenopathy.  Skin:    General: Skin is warm and dry.  Neurological:     Mental Status: She is alert and oriented to person, place, and time.     Deep Tendon Reflexes: Reflexes are normal and symmetric.  Psychiatric:        Behavior: Behavior normal.        Thought Content: Thought content normal.        Judgment: Judgment normal.    BP 117/66   Pulse 76   Temp (!) 97.5 F (36.4 C) (Temporal)   Resp 20   Ht _0  (1.651 m)   Wt 188 lb (85.3 kg)   SpO2 98%   BMI 31.28 kg/m         Assessment & Plan:   Towana Stenglein Paolo comes in today with chief complaint of Medical Management of Chronic Issues   Diagnosis and orders addressed:  1. Mixed hyperlipidemia Low fat diet - rosuvastatin (CRESTOR) 10 MG tablet; Take 1 tablet (10 mg total) by mouth daily.  Dispense: 90 tablet; Refill: 1 - CBC with Differential/Platelet - CMP14+EGFR - Lipid panel  2. Hypothyroidism due to acquired atrophy of thyroid Labs pending - levothyroxine (SYNTHROID) 50 MCG tablet; Take 1 tablet (50 mcg total) by mouth daily.  Dispense: 90 tablet; Refill: 1 - Thyroid Panel With TSH  3. Atypical meningioma of brain ([place])  4. Malignant neoplasm of right female breast, unspecified estrogen receptor status, unspecified site of breast [facility]) Keep follow up with surgeon and oncologist  5. BMI 31.0-31.9,adult Discussed diet and exercise for person with BMI >25 Will recheck weight in 3-6 months    Labs pending Health Maintenance reviewed Diet and exercise encouraged  Follow up plan: 6 months   [name] Done, FNP

## 2022-01-25 LAB — CBC WITH DIFFERENTIAL/PLATELET
Basophils Absolute: 0 10*3/uL (ref 0.0–0.2)
Basos: 0 %
EOS (ABSOLUTE): 0.2 10*3/uL (ref 0.0–0.4)
Eos: 3 %
Hematocrit: 41.5 % (ref 34.0–46.6)
Hemoglobin: 13.9 g/dL (ref 11.1–15.9)
Immature Grans (Abs): 0 10*3/uL (ref 0.0–0.1)
Immature Granulocytes: 0 %
Lymphocytes Absolute: 2.5 10*3/uL (ref 0.7–3.1)
Lymphs: 41 %
MCH: 30.3 pg (ref 26.6–33.0)
MCHC: 33.5 g/dL (ref 31.5–35.7)
MCV: 90 fL (ref 79–97)
Monocytes Absolute: 0.4 10*3/uL (ref 0.1–0.9)
Monocytes: 7 %
Neutrophils Absolute: 2.9 10*3/uL (ref 1.4–7.0)
Neutrophils: 49 %
Platelets: 269 10*3/uL (ref 150–450)
RBC: 4.59 x10E6/uL (ref 3.77–5.28)
RDW: 11.9 % (ref 11.7–15.4)
WBC: 6.1 10*3/uL (ref 3.4–10.8)

## 2022-01-25 LAB — CMP14+EGFR
ALT: 16 IU/L (ref 0–32)
AST: 23 IU/L (ref 0–40)
Albumin/Globulin Ratio: 2 (ref 1.2–2.2)
Albumin: 4.4 g/dL (ref 3.9–4.9)
Alkaline Phosphatase: 89 IU/L (ref 44–121)
BUN/Creatinine Ratio: 22 (ref 12–28)
BUN: 15 mg/dL (ref 8–27)
Bilirubin Total: 0.9 mg/dL (ref 0.0–1.2)
CO2: 24 mmol/L (ref 20–29)
Calcium: 10.1 mg/dL (ref 8.7–10.3)
Chloride: 103 mmol/L (ref 96–106)
Creatinine, Ser: 0.67 mg/dL (ref 0.57–1.00)
Globulin, Total: 2.2 g/dL (ref 1.5–4.5)
Glucose: 99 mg/dL (ref 70–99)
Potassium: 4.2 mmol/L (ref 3.5–5.2)
Sodium: 140 mmol/L (ref 134–144)
Total Protein: 6.6 g/dL (ref 6.0–8.5)
eGFR: 95 mL/min/{1.73_m2} (ref >59)

## 2022-01-25 LAB — THYROID PANEL WITH TSH
Free Thyroxine Index: 2.1 (ref 1.2–4.9)
T3 Uptake Ratio: 25 % (ref 24–39)
T4, Total: 8.4 ug/dL (ref 4.5–12.0)
TSH: 1.97 u[IU]/mL (ref 0.450–4.500)

## 2022-01-25 LAB — LIPID PANEL
Chol/HDL Ratio: 3.1 ratio (ref 0.0–4.4)
Cholesterol, Total: 160 mg/dL (ref 100–199)
HDL: 51 mg/dL (ref >39)
LDL Chol Calc (NIH): 87 mg/dL (ref 0–99)
Triglycerides: 124 mg/dL (ref 0–149)
VLDL Cholesterol Cal: 22 mg/dL (ref 5–40)

## 2022-02-01 DIAGNOSIS — D0511 Intraductal carcinoma in situ of right breast: Secondary | ICD-10-CM | POA: Diagnosis not present

## 2022-02-13 ENCOUNTER — Encounter: Payer: Self-pay | Admitting: *Deleted

## 2022-02-15 ENCOUNTER — Encounter (HOSPITAL_BASED_OUTPATIENT_CLINIC_OR_DEPARTMENT_OTHER): Payer: Self-pay | Admitting: General Surgery

## 2022-02-20 ENCOUNTER — Other Ambulatory Visit: Payer: Self-pay | Admitting: General Surgery

## 2022-02-25 DIAGNOSIS — D0511 Intraductal carcinoma in situ of right breast: Secondary | ICD-10-CM | POA: Diagnosis not present

## 2022-02-25 NOTE — Progress Notes (Signed)

## 2022-02-26 ENCOUNTER — Other Ambulatory Visit: Payer: Self-pay

## 2022-02-26 ENCOUNTER — Encounter (HOSPITAL_BASED_OUTPATIENT_CLINIC_OR_DEPARTMENT_OTHER): Payer: Self-pay | Admitting: General Surgery

## 2022-02-26 ENCOUNTER — Ambulatory Visit (HOSPITAL_BASED_OUTPATIENT_CLINIC_OR_DEPARTMENT_OTHER): Payer: Medicare Other | Admitting: Anesthesiology

## 2022-02-26 ENCOUNTER — Ambulatory Visit (HOSPITAL_BASED_OUTPATIENT_CLINIC_OR_DEPARTMENT_OTHER)
Admission: RE | Admit: 2022-02-26 | Discharge: 2022-02-26 | Disposition: A | Discharge status: Home / Self Care | Payer: Medicare Other | Attending: General Surgery | Admitting: General Surgery

## 2022-02-26 ENCOUNTER — Encounter (HOSPITAL_BASED_OUTPATIENT_CLINIC_OR_DEPARTMENT_OTHER)
Admission: RE | Disposition: A | Discharge status: Home / Self Care | Payer: Self-pay | Source: Home / Self Care | Attending: General Surgery

## 2022-02-26 DIAGNOSIS — Z79899 Other long term (current) drug therapy: Secondary | ICD-10-CM | POA: Diagnosis not present

## 2022-02-26 DIAGNOSIS — E785 Hyperlipidemia, unspecified: Secondary | ICD-10-CM | POA: Insufficient documentation

## 2022-02-26 DIAGNOSIS — Z79890 Hormone replacement therapy (postmenopausal): Secondary | ICD-10-CM | POA: Insufficient documentation

## 2022-02-26 DIAGNOSIS — E039 Hypothyroidism, unspecified: Secondary | ICD-10-CM | POA: Insufficient documentation

## 2022-02-26 DIAGNOSIS — D0512 Intraductal carcinoma in situ of left breast: Secondary | ICD-10-CM

## 2022-02-26 DIAGNOSIS — Z170 Estrogen receptor positive status [ER+]: Secondary | ICD-10-CM | POA: Diagnosis not present

## 2022-02-26 DIAGNOSIS — M1990 Unspecified osteoarthritis, unspecified site: Secondary | ICD-10-CM | POA: Insufficient documentation

## 2022-02-26 DIAGNOSIS — C50911 Malignant neoplasm of unspecified site of right female breast: Secondary | ICD-10-CM

## 2022-02-26 DIAGNOSIS — R921 Mammographic calcification found on diagnostic imaging of breast: Secondary | ICD-10-CM | POA: Diagnosis not present

## 2022-02-26 DIAGNOSIS — D0511 Intraductal carcinoma in situ of right breast: Secondary | ICD-10-CM | POA: Insufficient documentation

## 2022-02-26 DIAGNOSIS — N6011 Diffuse cystic mastopathy of right breast: Secondary | ICD-10-CM | POA: Diagnosis not present

## 2022-02-26 HISTORY — PX: BREAST LUMPECTOMY WITH RADIOACTIVE SEED LOCALIZATION: SHX6424

## 2022-02-26 SURGERY — BREAST LUMPECTOMY WITH RADIOACTIVE SEED LOCALIZATION
Anesthesia: General | Site: Breast | Laterality: Right

## 2022-02-26 MED ORDER — PROPOFOL 10 MG/ML IV BOLUS
INTRAVENOUS | Status: DC | PRN
  Administered 2022-02-26: 150 mg via INTRAVENOUS

## 2022-02-26 MED ORDER — DEXAMETHASONE SODIUM PHOSPHATE 4 MG/ML IJ SOLN
INTRAMUSCULAR | Status: DC | PRN
  Administered 2022-02-26: 5 mg via INTRAVENOUS

## 2022-02-26 MED ORDER — FENTANYL CITRATE (PF) 100 MCG/2ML IJ SOLN
INTRAMUSCULAR | Status: AC
  Filled 2022-02-26: qty 2

## 2022-02-26 MED ORDER — CHLORHEXIDINE GLUCONATE CLOTH 2 % EX PADS: 6.0000 | MEDICATED_PAD | Freq: Once | CUTANEOUS | Status: DC

## 2022-02-26 MED ORDER — PROPOFOL 500 MG/50ML IV EMUL
INTRAVENOUS | Status: DC | PRN
  Administered 2022-02-26: 200 ug/kg/min via INTRAVENOUS

## 2022-02-26 MED ORDER — CEFAZOLIN SODIUM-DEXTROSE 2-4 GM/100ML-% IV SOLN
2.0000 g | INTRAVENOUS | Status: AC
  Administered 2022-02-26: 2 g via INTRAVENOUS

## 2022-02-26 MED ORDER — LIDOCAINE HCL (CARDIAC) PF 100 MG/5ML IV SOSY
PREFILLED_SYRINGE | INTRAVENOUS | Status: DC | PRN
  Administered 2022-02-26: 60 mg via INTRAVENOUS

## 2022-02-26 MED ORDER — ONDANSETRON HCL 4 MG/2ML IJ SOLN
INTRAMUSCULAR | Status: AC
  Filled 2022-02-26: qty 2

## 2022-02-26 MED ORDER — MIDAZOLAM HCL 5 MG/5ML IJ SOLN
INTRAMUSCULAR | Status: DC | PRN
  Administered 2022-02-26: 1 mg via INTRAVENOUS

## 2022-02-26 MED ORDER — SUCCINYLCHOLINE CHLORIDE 200 MG/10ML IV SOSY
PREFILLED_SYRINGE | INTRAVENOUS | Status: AC
  Filled 2022-02-26: qty 10

## 2022-02-26 MED ORDER — FENTANYL CITRATE (PF) 100 MCG/2ML IJ SOLN: 25.0000 ug | INTRAMUSCULAR | Status: DC | PRN

## 2022-02-26 MED ORDER — DEXAMETHASONE SODIUM PHOSPHATE 10 MG/ML IJ SOLN
INTRAMUSCULAR | Status: AC
  Filled 2022-02-26: qty 1

## 2022-02-26 MED ORDER — EPHEDRINE 5 MG/ML INJ
INTRAVENOUS | Status: AC
  Filled 2022-02-26: qty 5

## 2022-02-26 MED ORDER — PHENYLEPHRINE 80 MCG/ML (10ML) SYRINGE FOR IV PUSH (FOR BLOOD PRESSURE SUPPORT)
PREFILLED_SYRINGE | INTRAVENOUS | Status: AC
  Filled 2022-02-26: qty 10

## 2022-02-26 MED ORDER — CEFAZOLIN SODIUM-DEXTROSE 2-4 GM/100ML-% IV SOLN
INTRAVENOUS | Status: AC
  Filled 2022-02-26: qty 100

## 2022-02-26 MED ORDER — ENSURE PRE-SURGERY PO LIQD: 296.0000 mL | Freq: Once | ORAL | Status: DC

## 2022-02-26 MED ORDER — ACETAMINOPHEN 500 MG PO TABS
ORAL_TABLET | ORAL | Status: AC
  Filled 2022-02-26: qty 2

## 2022-02-26 MED ORDER — LIDOCAINE 2% (20 MG/ML) 5 ML SYRINGE
INTRAMUSCULAR | Status: AC
  Filled 2022-02-26: qty 5

## 2022-02-26 MED ORDER — LACTATED RINGERS IV SOLN: INTRAVENOUS | Status: DC

## 2022-02-26 MED ORDER — BUPIVACAINE HCL (PF) 0.25 % IJ SOLN
INTRAMUSCULAR | Status: DC | PRN
  Administered 2022-02-26: 10 mL

## 2022-02-26 MED ORDER — MIDAZOLAM HCL 2 MG/2ML IJ SOLN
INTRAMUSCULAR | Status: AC
  Filled 2022-02-26: qty 2

## 2022-02-26 MED ORDER — ACETAMINOPHEN 500 MG PO TABS
1000.0000 mg | ORAL_TABLET | ORAL | Status: AC
  Administered 2022-02-26: 1000 mg via ORAL

## 2022-02-26 MED ORDER — ATROPINE SULFATE 0.4 MG/ML IV SOLN
INTRAVENOUS | Status: AC
  Filled 2022-02-26: qty 1

## 2022-02-26 MED ORDER — FENTANYL CITRATE (PF) 100 MCG/2ML IJ SOLN
INTRAMUSCULAR | Status: DC | PRN
  Administered 2022-02-26: 50 ug via INTRAVENOUS

## 2022-02-26 SURGICAL SUPPLY — 55 items
ADH SKN CLS APL DERMABOND .7 (GAUZE/BANDAGES/DRESSINGS) ×1
APL PRP STRL LF DISP 70% ISPRP (MISCELLANEOUS) ×1
APPLIER CLIP 9.375 MED OPEN (MISCELLANEOUS)
APR CLP MED 9.3 20 MLT OPN (MISCELLANEOUS)
BINDER BREAST LRG (GAUZE/BANDAGES/DRESSINGS) IMPLANT
BINDER BREAST MEDIUM (GAUZE/BANDAGES/DRESSINGS) IMPLANT
BINDER BREAST XLRG (GAUZE/BANDAGES/DRESSINGS) IMPLANT
BINDER BREAST XXLRG (GAUZE/BANDAGES/DRESSINGS) IMPLANT
BLADE SURG 15 STRL LF DISP TIS (BLADE) ×1 IMPLANT
BLADE SURG 15 STRL SS (BLADE) ×1
CANISTER SUC SOCK COL 7IN (MISCELLANEOUS) IMPLANT
CANISTER SUCT 1200ML W/VALVE (MISCELLANEOUS) IMPLANT
CHLORAPREP W/TINT 26 (MISCELLANEOUS) ×1 IMPLANT
CLIP APPLIE 9.375 MED OPEN (MISCELLANEOUS) IMPLANT
CLIP TI WIDE RED SMALL 6 (CLIP) IMPLANT
COVER BACK TABLE 60X90IN (DRAPES) ×1 IMPLANT
COVER MAYO STAND STRL (DRAPES) ×1 IMPLANT
COVER PROBE CYLINDRICAL 5X96 (MISCELLANEOUS) ×1 IMPLANT
DERMABOND ADVANCED .7 DNX12 (GAUZE/BANDAGES/DRESSINGS) ×1 IMPLANT
DRAPE LAPAROSCOPIC ABDOMINAL (DRAPES) ×1 IMPLANT
DRAPE UTILITY XL STRL (DRAPES) ×1 IMPLANT
DRSG TEGADERM 4X4.75 (GAUZE/BANDAGES/DRESSINGS) IMPLANT
ELECT COATED BLADE 2.86 ST (ELECTRODE) ×1 IMPLANT
ELECT REM PT RETURN 9FT ADLT (ELECTROSURGICAL) ×1
ELECTRODE REM PT RTRN 9FT ADLT (ELECTROSURGICAL) ×1 IMPLANT
GAUZE SPONGE 4X4 12PLY STRL LF (GAUZE/BANDAGES/DRESSINGS) IMPLANT
GLOVE BIO SURGEON STRL SZ7 (GLOVE) ×2 IMPLANT
GLOVE BIOGEL PI IND STRL 7.5 (GLOVE) ×1 IMPLANT
GOWN STRL REUS W/ TWL LRG LVL3 (GOWN DISPOSABLE) ×2 IMPLANT
GOWN STRL REUS W/TWL LRG LVL3 (GOWN DISPOSABLE) ×2
HEMOSTAT ARISTA ABSORB 3G PWDR (HEMOSTASIS) IMPLANT
KIT MARKER MARGIN INK (KITS) ×1 IMPLANT
NDL HYPO 25X1 1.5 SAFETY (NEEDLE) ×1 IMPLANT
NEEDLE HYPO 25X1 1.5 SAFETY (NEEDLE) ×1 IMPLANT
NS IRRIG 1000ML POUR BTL (IV SOLUTION) IMPLANT
PACK BASIN DAY SURGERY FS (CUSTOM PROCEDURE TRAY) ×1 IMPLANT
PENCIL SMOKE EVACUATOR (MISCELLANEOUS) ×1 IMPLANT
RETRACTOR ONETRAX LX 90X20 (MISCELLANEOUS) IMPLANT
SLEEVE SCD COMPRESS KNEE MED (STOCKING) ×1 IMPLANT
SPIKE FLUID TRANSFER (MISCELLANEOUS) IMPLANT
SPONGE T-LAP 4X18 ~~LOC~~+RFID (SPONGE) ×1 IMPLANT
STRIP CLOSURE SKIN 1/2X4 (GAUZE/BANDAGES/DRESSINGS) ×1 IMPLANT
SUT MNCRL AB 4-0 PS2 18 (SUTURE) ×1 IMPLANT
SUT MON AB 5-0 PS2 18 (SUTURE) IMPLANT
SUT SILK 2 0 SH (SUTURE) IMPLANT
SUT VIC AB 2-0 SH 27 (SUTURE) ×2
SUT VIC AB 2-0 SH 27XBRD (SUTURE) ×1 IMPLANT
SUT VIC AB 3-0 SH 27 (SUTURE) ×1
SUT VIC AB 3-0 SH 27X BRD (SUTURE) ×1 IMPLANT
SUT VIC AB 5-0 PS2 18 (SUTURE) IMPLANT
SYR CONTROL 10ML LL (SYRINGE) ×1 IMPLANT
TOWEL GREEN STERILE FF (TOWEL DISPOSABLE) ×1 IMPLANT
TRAY FAXITRON CT DISP (TRAY / TRAY PROCEDURE) ×1 IMPLANT
TUBE CONNECTING 20X1/4 (TUBING) IMPLANT
YANKAUER SUCT BULB TIP NO VENT (SUCTIONS) IMPLANT

## 2022-02-26 NOTE — Anesthesia Procedure Notes (Signed)
Procedure Name: LMA Insertion Date/Time: 02/26/2022 8:47 AM  Performed by: Willa Frater, CRNAPre-anesthesia Checklist: Patient identified, Emergency Drugs available, Suction available and Patient being monitored Patient Re-evaluated:Patient Re-evaluated prior to induction Oxygen Delivery Method: Circle system utilized Preoxygenation: Pre-oxygenation with 100% oxygen Induction Type: IV induction Ventilation: Mask ventilation without difficulty LMA: LMA inserted LMA Size: 4.0 Number of attempts: 1 Airway Equipment and Method: Bite block Placement Confirmation: positive ETCO2 Tube secured with: Tape Dental Injury: Teeth and Oropharynx as per pre-operative assessment

## 2022-02-26 NOTE — Transfer of Care (Signed)
Immediate Anesthesia Transfer of Care Note  Patient: [name]  Procedure(s) Performed: RIGHT BREAST LUMPECTOMY WITH RADIOACTIVE SEED LOCALIZATION (Right: Breast)  Patient Location: PACU  Anesthesia Type:General  Level of Consciousness: awake, drowsy, and patient cooperative  Airway & Oxygen Therapy: Patient Spontanous Breathing and Patient connected to face mask oxygen  Post-op Assessment: Report given to RN and Post -op Vital signs reviewed and stable  Post vital signs: Reviewed and stable  Last Vitals:  Vitals Value Taken Time  BP    Temp    Pulse 87 02/26/22 0944  Resp    SpO2 97 % 02/26/22 0944  Vitals shown include unvalidated device data.  Last Pain:  Vitals:   02/26/22 0745  TempSrc: Oral  PainSc: 0-No pain         Complications: No notable events documented.

## 2022-02-26 NOTE — Op Note (Addendum)
Preoperative diagnosis: Right breast DCIS Postoperative diagnosis: Same as above Procedure: Right breast radioactive seed guided lumpectomy Surgeon: Dr. Serita Grammes Anesthesia: General Estimated blood loss: Minimal Specimens: RIght breast tissue marked with paint containing biopsy clip, seed separate, additional superomedial and posterior margins marked short superior, long lateral and double deep Complications: None Drains: None Sponge needle count was correct at completion Disposition to recovery stable condition   Indications: 69 yof who I know from 2010. At that time she underwent right lumpectomy for er pos dcis followed by radiotherapy and then did five years of tamoxifen.  She has mm that shows b density breast tissue. There are right sided calcs noted that are coarse and grouped. This is 3 mm. Biopsy is int grade DCIS that is 100% er and pr pos. We discussed lumpectomy again.  Procedure: After informed consent was obtained the patient was taken to the operating room.  She was given antibiotics.  SCDs were in place.  She was placed under general anesthesia without complication.  She was prepped and draped in the standard sterile surgical fashion.  A surgical timeout was then performed.   I located the seed in the lower outer breast.the seed was very close to the skin.  I made a curvilinear incision overyling the seed. I then dissected down to the seed.  I removed the seed and some of the surrounding tissue. The seed was on the edge and I did remove this separately to maintain control.   I then did a mammogram of the removed specimen and it contained the radioactive seed and clip. I did think I was close to several margins so I excised these as well.   I then obtained hemostasis.  I closed the breast tissue down with 2-0 Vicryl.  The skin was closed with 3-0 Vicryl and 4-0 Monocryl.  Glue and Steri-Strips were applied.  She tolerated this well was extubated and transferred to recovery  stable.

## 2022-02-26 NOTE — Anesthesia Preprocedure Evaluation (Addendum)
Anesthesia Evaluation  Patient identified by MRN, date of birth, ID band Patient awake    Reviewed: Allergy & Precautions, NPO status , Patient's Chart, lab work & pertinent test results  Airway Mallampati: III  TM Distance: >3 FB Neck ROM: Full  Mouth opening: Limited Mouth Opening  Dental no notable dental hx. (+) Teeth Intact, Dental Advisory Given   Pulmonary neg pulmonary ROS   Pulmonary exam normal breath sounds clear to auscultation       Cardiovascular negative cardio ROS Normal cardiovascular exam Rhythm:Regular Rate:Normal  HLD   Neuro/Psych negative neurological ROS  negative psych ROS   GI/Hepatic negative GI ROS, Neg liver ROS,,,  Endo/Other  Hypothyroidism    Renal/GU negative Renal ROS  negative genitourinary   Musculoskeletal  (+) Arthritis ,    Abdominal   Peds  Hematology negative hematology ROS (+)   Anesthesia Other Findings Right breast CA   Reproductive/Obstetrics                             Anesthesia Physical Anesthesia Plan  ASA: 2  Anesthesia Plan: General   Post-op Pain Management: Tylenol PO (pre-op)*   Induction: Intravenous  PONV Risk Score and Plan: 3 and Ondansetron, Dexamethasone and Midazolam  Airway Management Planned: LMA  Additional Equipment:   Intra-op Plan:   Post-operative Plan: Extubation in OR  Informed Consent: I have reviewed the patients History and Physical, chart, labs and discussed the procedure including the risks, benefits and alternatives for the proposed anesthesia with the patient or authorized representative who has indicated his/her understanding and acceptance.     Dental advisory given  Plan Discussed with: CRNA  Anesthesia Plan Comments:        Anesthesia Quick Evaluation

## 2022-02-26 NOTE — Discharge Instructions (Addendum)
[place] Office Phone Number [phone]  POST OP INSTRUCTIONS Take 400 mg of ibuprofen every 8 hours or 650 mg tylenol every 6 hours for next 72 hours then as needed. Use ice several times daily also.  A prescription for pain medication may be given to you upon discharge.  Take your pain medication as prescribed, if needed.  If narcotic pain medicine is not needed, then you may take acetaminophen (Tylenol), naprosyn (Alleve) or ibuprofen (Advil) as needed. Take your usually prescribed medications unless otherwise directed If you need a refill on your pain medication, please contact your pharmacy.  They will contact our office to request authorization.  Prescriptions will not be filled after 5pm or on week-ends. You should eat very light the first 24 hours after surgery, such as soup, crackers, pudding, etc.  Resume your normal diet the day after surgery. Most patients will experience some swelling and bruising in the breast.  Ice packs and a good support bra will help.  Wear the breast binder provided or a sports bra for 72 hours day and night.  After that wear a sports bra during the day until you return to the office. Swelling and bruising can take several days to resolve.  It is common to experience some constipation if taking pain medication after surgery.  Increasing fluid intake and taking a stool softener will usually help or prevent this problem from occurring.  A mild laxative (Milk of Magnesia or Miralax) should be taken according to package directions if there are no bowel movements after 48 hours. I used skin glue on the incision, you may shower in 24 hours.  The glue will flake off over the next 2-3 weeks.  Any sutures or staples will be removed at the office during your follow-up visit. ACTIVITIES:  You may resume regular daily activities (gradually increasing) beginning the next day.  Wearing a good support bra or sports bra minimizes pain and swelling.  You may have  sexual intercourse when it is comfortable. You may drive when you no longer are taking prescription pain medication, you can comfortably wear a seatbelt, and you can safely maneuver your car and apply brakes. RETURN TO WORK:  ______________________________________________________________________________________ Dennis Bast should see your doctor in the office for a follow-up appointment approximately two weeks after your surgery.  Your doctor's nurse will typically make your follow-up appointment when she calls you with your pathology report.  Expect your pathology report 3-4 business days after your surgery.  You may call to check if you do not hear from [place] after three days. OTHER INSTRUCTIONS: _______________________________________________________________________________________________ _____________________________________________________________________________________________________________________________________ _____________________________________________________________________________________________________________________________________ _____________________________________________________________________________________________________________________________________  WHEN TO CALL DR WAKEFIELD: Fever over 101.0 Nausea and/or vomiting. Extreme swelling or bruising. Continued bleeding from incision. Increased pain, redness, or drainage from the incision.  The clinic staff is available to answer your questions during regular business hours.  Please don't hesitate to call and ask to speak to one of the nurses for clinical concerns.  If you have a medical emergency, go to the nearest emergency room or call 911.  A surgeon from [facility] is always on call at the hospital.  For further questions, please visit [email or web address]    Post Anesthesia Home Care Instructions  Activity: Get plenty of rest for the remainder of the day. A responsible individual must stay  with you for 24 hours following the procedure.  For the next 24 hours, DO NOT: -Drive a car -Paediatric nurse -Drink alcoholic beverages -Take any medication unless instructed  by your physician -Make any legal decisions or sign important papers.  Meals: Start with liquid foods such as gelatin or soup. Progress to regular foods as tolerated. Avoid greasy, spicy, heavy foods. If nausea and/or vomiting occur, drink only clear liquids until the nausea and/or vomiting subsides. Call your physician if vomiting continues.  Special Instructions/Symptoms: Your throat may feel dry or sore from the anesthesia or the breathing tube placed in your throat during surgery. If this causes discomfort, gargle with warm salt water. The discomfort should disappear within 24 hours.  If you had a scopolamine patch placed behind your ear for the management of post- operative nausea and/or vomiting:  1. The medication in the patch is effective for 72 hours, after which it should be removed.  Wrap patch in a tissue and discard in the trash. Wash hands thoroughly with soap and water. 2. You may remove the patch earlier than 72 hours if you experience unpleasant side effects which may include dry mouth, dizziness or visual disturbances. 3. Avoid touching the patch. Wash your hands with soap and water after contact with the patch.    No tylenol until after 2pm if needed today.

## 2022-02-26 NOTE — Interval H&P Note (Signed)
History and Physical Interval Note:  02/26/2022 8:16 AM  Julia Poole  has presented today for surgery, with the diagnosis of RIGHT BREAST DCIS.  The various methods of treatment have been discussed with the patient and family. After consideration of risks, benefits and other options for treatment, the patient has consented to  Procedure(s): RIGHT BREAST LUMPECTOMY WITH RADIOACTIVE SEED LOCALIZATION (Right) as a surgical intervention.  The patient's history has been reviewed, patient examined, no change in status, stable for surgery.  I have reviewed the patient's chart and labs.  Questions were answered to the patient's satisfaction.     Rolm Bookbinder

## 2022-02-26 NOTE — Anesthesia Postprocedure Evaluation (Signed)
Anesthesia Post Note  Patient: Julia Poole  Procedure(s) Performed: RIGHT BREAST LUMPECTOMY WITH RADIOACTIVE SEED LOCALIZATION (Right: Breast)     Patient location during evaluation: PACU Anesthesia Type: General Level of consciousness: awake and alert Pain management: pain level controlled Vital Signs Assessment: post-procedure vital signs reviewed and stable Respiratory status: spontaneous breathing, nonlabored ventilation, respiratory function stable and patient connected to nasal cannula oxygen Cardiovascular status: blood pressure returned to baseline and stable Postop Assessment: no apparent nausea or vomiting Anesthetic complications: no  No notable events documented.  Last Vitals:  Vitals:   02/26/22 1017 02/26/22 1036  BP:  120/68  Pulse: 65 71  Resp: (!) 8 18  Temp:  36.7 C  SpO2: 97% 98%    Last Pain:  Vitals:   02/26/22 1036  TempSrc: Oral  PainSc: 0-No pain                 Lasondra Hodgkins L Margene Cherian

## 2022-02-26 NOTE — H&P (Signed)
  20 yof who I know from 2010. At that time she underwent right lumpectomy for er pos dcis followed by radiotherapy and then did five years of tamoxifen. She has done well since then. She has had crani for meningiorma as well as radiotherapy for some other areas. She has done well from this. She has no mass or dc. She has mm that shows b density breast tissue. There are right sided calcs noted that are coarse and grouped. This is 3 mm. Biopsy is int grade DCIS that is 100% er and pr pos. She is here to discuss options  Review of Systems: A complete review of systems was obtained from the patient. I have reviewed this information and discussed as appropriate with the patient. See HPI as well for other ROS.  Review of Systems All other systems reviewed and are negative.  Medical History: Past Medical History: Diagnosis Date Arthritis History of cancer Thyroid disease   Past Surgical History: Procedure Laterality Date CRANIOPLASTY FOR REPAIR SKULL DEFECT W/REPARATIVE BRAIN SURGERY MASTECTOMY PARTIAL / LUMPECTOMY   Allergies Allergen Reactions Hydrocodone-Acetaminophen Nausea  Current Outpatient Medications on File Prior to Visit Medication Sig Dispense Refill acetaminophen-codeine (TYLENOL #3) 300-30 mg tablet TAKE 1 TABLET EVERY 6 HOURS AS NEEDED FOR MODERATE PAIN levothyroxine (SYNTHROID) 50 MCG tablet Take by mouth rosuvastatin (CRESTOR) 10 MG tablet Take 1 tablet by mouth once daily   Family History Problem Relation Age of Onset Colon cancer Mother Stroke Father Coronary Artery Disease (Blocked arteries around heart) Father Hyperlipidemia (Elevated cholesterol) Father High blood pressure (Hypertension) Father Diabetes Father Obesity Sister Hyperlipidemia (Elevated cholesterol) Sister Diabetes Sister Skin cancer Brother   Social History  Tobacco Use Smoking Status Never Smokeless Tobacco Never Marital status: Married Tobacco Use Smoking status: Never Smokeless  tobacco: Never Substance and Sexual Activity Alcohol use: Not Currently Drug use: Never  Objective:  Vitals: 02/01/22 1046 BP: (!) 140/90 Pulse: 79 Temp: 36.7 C (98 F) SpO2: 98% Weight: 85.3 kg (188 lb) Height: 165.1 cm ('5\' 5"'$ )  Body mass index is 31.28 kg/m.  Physical Exam Vitals reviewed. Constitutional: Appearance: Normal appearance. Chest: Breasts: Right: No inverted nipple, mass or nipple discharge. Left: No inverted nipple, mass or nipple discharge. Lymphadenopathy: Upper Body: Right upper body: No supraclavicular or axillary adenopathy. Left upper body: No supraclavicular or axillary adenopathy. Neurological: Mental Status: She is alert.   Assessment and Plan:  Ductal carcinoma in situ (DCIS) of right breast  Right breast seed guided lumpectomy  I think reasonable at 66 to consider lumpectomy as opposed to mastectomy and forgo radiotherapy for this stage 0 cancer. She has a little bit higher local recurence but I think lumpectomy plus antiestrogen is reasonable. She would like to avoid mastectomy as well. She does not need a node biopsy for this. We discussed surgery and recovery as well as adjuvant therapy.

## 2022-02-27 ENCOUNTER — Encounter (HOSPITAL_BASED_OUTPATIENT_CLINIC_OR_DEPARTMENT_OTHER): Payer: Self-pay | Admitting: General Surgery

## 2022-02-27 DIAGNOSIS — D0511 Intraductal carcinoma in situ of right breast: Secondary | ICD-10-CM | POA: Diagnosis not present

## 2022-02-28 ENCOUNTER — Encounter (HOSPITAL_COMMUNITY): Payer: Self-pay

## 2022-02-28 LAB — SURGICAL PATHOLOGY

## 2022-03-11 ENCOUNTER — Encounter: Payer: Self-pay | Admitting: Family Medicine

## 2022-03-11 ENCOUNTER — Ambulatory Visit (INDEPENDENT_AMBULATORY_CARE_PROVIDER_SITE_OTHER): Payer: Medicare Other | Admitting: Family Medicine

## 2022-03-11 VITALS — BP 121/57 | HR 92 | Temp 97.9°F | Ht 65.0 in | Wt 182.6 lb

## 2022-03-11 DIAGNOSIS — J329 Chronic sinusitis, unspecified: Secondary | ICD-10-CM

## 2022-03-11 DIAGNOSIS — J069 Acute upper respiratory infection, unspecified: Secondary | ICD-10-CM | POA: Diagnosis not present

## 2022-03-11 DIAGNOSIS — J40 Bronchitis, not specified as acute or chronic: Secondary | ICD-10-CM | POA: Diagnosis not present

## 2022-03-11 MED ORDER — AMOXICILLIN-POT CLAVULANATE 875-125 MG PO TABS: 1.0000 | ORAL_TABLET | Freq: Two times a day (BID) | ORAL | 0 refills | Status: DC

## 2022-03-11 MED ORDER — BENZONATATE 200 MG PO CAPS: 200.0000 mg | ORAL_CAPSULE | Freq: Three times a day (TID) | ORAL | 0 refills | Status: DC | PRN

## 2022-03-11 NOTE — Progress Notes (Signed)
Subjective:  Patient ID: Julia Poole, female    DOB: January 23, 1956  Age: 66 y.o. MRN: [id number]  CC: Cough   HPI Julia Poole presents for SUBJECTIVE:  Julia Poole is a 66 y.o. female who complains of congestion, post nasal drip, productive cough, bilateral sinus pain, and clear nasal discharge for 5 days. She denies a history of chest pain, myalgias, nausea, and shortness of breath and denies a history of asthma. Patient denies smoke cigarettes.         07/27/2021   11:54 AM 04/30/2021   10:24 AM 02/14/2021   10:28 AM  Depression screen PHQ 2/9  Decreased Interest 0 0 0  Down, Depressed, Hopeless 0 0 0  PHQ - 2 Score 0 0 0  Altered sleeping 0 0   Tired, decreased energy 0 0   Change in appetite 0 0   Feeling bad or failure about yourself  0 0   Trouble concentrating 0 0   Moving slowly or fidgety/restless 0 0   Suicidal thoughts 0 0   PHQ-9 Score 0 0   Difficult doing work/chores Not difficult at all Not difficult at all     History Cheyanne has a past medical history of Allergy, Arthritis, Bleeding nose, Cancer ([place]) (2010), Cancer ([place]) (09/2015), Cataract, Degenerative disk disease (2012), Family history of breast cancer in female, Family history of colon cancer, Family history of ovarian cancer, Hemorrhoids, Hyperlipidemia, Sinus problem, and Thyroid disease.   She has a past surgical history that includes Ganglion cyst excision (SEP. 1997); Colonoscopy; Breast surgery (AUG. 2010); Breast surgery; Craniotomy (Left, 10/16/2015); Brain surgery; and Breast lumpectomy with radioactive seed localization (Right, 02/26/2022).   Her family history includes Alcohol abuse in her paternal grandfather; Breast cancer (age of onset: 47) in her brother; Cancer in her brother, brother, and father; Colon cancer (age of onset: 28) in her mother; Colon polyps in her mother and sister; Diabetes in her father and paternal grandmother; Heart disease in her father and maternal grandfather;  Leukemia (age of onset: 69) in her sister; Ovarian cancer in her maternal aunt; Tuberculosis in her maternal grandmother.She reports that she has never smoked. She has never used smokeless tobacco. She reports that she does not drink alcohol and does not use drugs.     Objective:  BP (!) 121/57   Pulse 92   Temp 97.9 F (36.6 C)   Ht '5\' 5"'$  (1.651 m)   Wt 182 lb 9.6 oz (82.8 kg)   SpO2 99%   BMI 30.39 kg/m   BP Readings from Last 3 Encounters:  03/11/22 (!) 121/57  02/26/22 120/68  01/24/22 117/66    Wt Readings from Last 3 Encounters:  03/11/22 182 lb 9.6 oz (82.8 kg)  02/26/22 185 lb 6.5 oz (84.1 kg)  01/24/22 188 lb (85.3 kg)     Physical Exam   OBJECTIVE: She appears well, vital signs are as noted. Ears normal.  Throat and pharynx normal.  Neck supple. No adenopathy in the neck. Nose is inflamed, congested. Sinuses non tender. The chest has bronchitic changes   Assessment & Plan:   Julia Poole was seen today for cough.  Diagnoses and all orders for this visit:  Upper respiratory infection with cough and congestion -     COVID-19, Flu A+B and RSV  Sinobronchitis  Other orders -     amoxicillin-clavulanate (AUGMENTIN) 875-125 MG tablet; Take 1 tablet by mouth 2 (two) times daily. Take all of this medication -  benzonatate (TESSALON) 200 MG capsule; Take 1 capsule (200 mg total) by mouth 3 (three) times daily as needed for cough.       I am having Bertice F. Mohs start on amoxicillin-clavulanate and benzonatate. I am also having her maintain her aspirin, Multiple Vitamins-Minerals (CENTRUM SILVER PO), ascorbic acid, Vitamin D, (FLAXSEED, LINSEED, PO), vitamin E, TURMERIC PO, COCONUT OIL PO, Glucosamine-Chondroitin (OSTEO BI-FLEX REGULAR STRENGTH PO), Biotin, Fiber Choice, albuterol, Cyanocobalamin (VITAMIN B 12 PO), [place]wellia-Glucosamine-Vit D (OSTEO BI-FLEX ONE PER DAY PO), Calcium Carb-Cholecalciferol (CALTRATE 600+D3 PO), pseudoephedrine, ondansetron,  levothyroxine, rosuvastatin, and acetaminophen-codeine. We will continue to administer sodium chloride.  Allergies as of 03/11/2022       Reactions   Norco [hydrocodone-acetaminophen] Nausea Only        Medication List        Accurate as of March 11, 2022 12:11 PM. If you have any questions, ask your nurse or doctor.          acetaminophen-codeine 300-30 MG tablet Commonly known as: TYLENOL #3 Take 1 tablet by mouth every 4 (four) hours as needed for moderate pain.   albuterol 108 (90 Base) MCG/ACT inhaler Commonly known as: VENTOLIN HFA Inhale 2 puffs into the lungs every 6 (six) hours as needed for wheezing or shortness of breath.   amoxicillin-clavulanate 875-125 MG tablet Commonly known as: AUGMENTIN Take 1 tablet by mouth 2 (two) times daily. Take all of this medication Started by: Claretta Fraise, MD   ascorbic acid 500 MG tablet Commonly known as: VITAMIN C Take 500 mg by mouth daily.   aspirin 81 MG tablet Take 81 mg by mouth daily.   benzonatate 200 MG capsule Commonly known as: TESSALON Take 1 capsule (200 mg total) by mouth 3 (three) times daily as needed for cough. Started by: Claretta Fraise, MD   Biotin 10 MG Caps Take by mouth.   CALTRATE 600+D3 PO Take by mouth.   CENTRUM SILVER PO Take by mouth.   COCONUT OIL PO Take by mouth.   Fiber Choice 1.5 g Chew Generic drug: Inulin Chew by mouth.   FLAXSEED (LINSEED) PO Take by mouth.   levothyroxine 50 MCG tablet Commonly known as: SYNTHROID Take 1 tablet (50 mcg total) by mouth daily.   ondansetron 4 MG disintegrating tablet Commonly known as: ZOFRAN-ODT Take 1 tablet (4 mg total) by mouth every 8 (eight) hours as needed for nausea or vomiting.   OSTEO BI-FLEX ONE PER DAY PO Take by mouth.   OSTEO BI-FLEX REGULAR STRENGTH PO Take by mouth.   pseudoephedrine 30 MG tablet Commonly known as: SUDAFED Take 30 mg by mouth every 4 (four) hours as needed for congestion.   rosuvastatin  10 MG tablet Commonly known as: CRESTOR Take 1 tablet (10 mg total) by mouth daily.   TURMERIC PO Take by mouth.   VITAMIN B 12 PO Take by mouth.   Vitamin D 50 MCG (2000 UT) tablet Take 2,000 Units by mouth 2 (two) times daily.   vitamin E 180 MG (400 UNITS) capsule Take 400 Units by mouth daily.         Follow-up: Return if symptoms worsen or fail to improve.  Claretta Fraise, M.D.

## 2022-03-12 ENCOUNTER — Telehealth: Payer: Self-pay | Admitting: Nurse Practitioner

## 2022-03-13 ENCOUNTER — Other Ambulatory Visit: Payer: Self-pay | Admitting: Family Medicine

## 2022-03-13 LAB — COVID-19, FLU A+B AND RSV
Influenza A, NAA: NOT DETECTED
Influenza B, NAA: NOT DETECTED
RSV, NAA: NOT DETECTED
SARS-CoV-2, NAA: DETECTED — AB

## 2022-03-13 MED ORDER — NIRMATRELVIR/RITONAVIR (PAXLOVID)TABLET: 3.0000 | ORAL_TABLET | Freq: Two times a day (BID) | ORAL | 0 refills | Status: AC

## 2022-03-13 NOTE — Telephone Encounter (Signed)
Covid positive. I sent in the Covid antibiotic for her,

## 2022-03-14 NOTE — Telephone Encounter (Signed)
Patient aware of labs.  

## 2022-04-04 ENCOUNTER — Encounter: Payer: Self-pay | Admitting: *Deleted

## 2022-04-04 ENCOUNTER — Other Ambulatory Visit: Payer: Self-pay | Admitting: *Deleted

## 2022-04-04 DIAGNOSIS — D0511 Intraductal carcinoma in situ of right breast: Secondary | ICD-10-CM | POA: Insufficient documentation

## 2022-04-12 ENCOUNTER — Inpatient Hospital Stay: Payer: Medicare Other | Attending: Hematology and Oncology | Admitting: Hematology and Oncology

## 2022-04-12 ENCOUNTER — Other Ambulatory Visit: Payer: Self-pay

## 2022-04-12 VITALS — BP 149/79 | HR 50 | Temp 97.7°F | Resp 18 | Wt 186.4 lb

## 2022-04-12 DIAGNOSIS — Z79810 Long term (current) use of selective estrogen receptor modulators (SERMs): Secondary | ICD-10-CM

## 2022-04-12 DIAGNOSIS — Z833 Family history of diabetes mellitus: Secondary | ICD-10-CM

## 2022-04-12 DIAGNOSIS — D0511 Intraductal carcinoma in situ of right breast: Secondary | ICD-10-CM

## 2022-04-12 DIAGNOSIS — Z8719 Personal history of other diseases of the digestive system: Secondary | ICD-10-CM | POA: Diagnosis not present

## 2022-04-12 DIAGNOSIS — Z79899 Other long term (current) drug therapy: Secondary | ICD-10-CM | POA: Insufficient documentation

## 2022-04-12 DIAGNOSIS — Z8041 Family history of malignant neoplasm of ovary: Secondary | ICD-10-CM | POA: Insufficient documentation

## 2022-04-12 DIAGNOSIS — Z806 Family history of leukemia: Secondary | ICD-10-CM | POA: Diagnosis not present

## 2022-04-12 DIAGNOSIS — Z83719 Family history of colon polyps, unspecified: Secondary | ICD-10-CM | POA: Diagnosis not present

## 2022-04-12 DIAGNOSIS — Z8249 Family history of ischemic heart disease and other diseases of the circulatory system: Secondary | ICD-10-CM | POA: Insufficient documentation

## 2022-04-12 DIAGNOSIS — Z800 Family history of malignant neoplasm of digestive organs: Secondary | ICD-10-CM | POA: Insufficient documentation

## 2022-04-12 DIAGNOSIS — E034 Atrophy of thyroid (acquired): Secondary | ICD-10-CM | POA: Diagnosis not present

## 2022-04-12 DIAGNOSIS — C50911 Malignant neoplasm of unspecified site of right female breast: Secondary | ICD-10-CM

## 2022-04-12 DIAGNOSIS — E785 Hyperlipidemia, unspecified: Secondary | ICD-10-CM | POA: Insufficient documentation

## 2022-04-12 DIAGNOSIS — Z836 Family history of other diseases of the respiratory system: Secondary | ICD-10-CM

## 2022-04-12 DIAGNOSIS — Z885 Allergy status to narcotic agent status: Secondary | ICD-10-CM | POA: Insufficient documentation

## 2022-04-12 DIAGNOSIS — Z803 Family history of malignant neoplasm of breast: Secondary | ICD-10-CM | POA: Diagnosis not present

## 2022-04-12 DIAGNOSIS — Z811 Family history of alcohol abuse and dependence: Secondary | ICD-10-CM | POA: Insufficient documentation

## 2022-04-12 MED ORDER — ANASTROZOLE 1 MG PO TABS: 1.0000 mg | ORAL_TABLET | Freq: Every day | ORAL | 3 refills | Status: DC

## 2022-04-12 NOTE — Progress Notes (Signed)
[facility] NOTE  Patient Care Team: Chevis Pretty, FNP as PCP - General (Nurse Practitioner) Merilyn Baba, MD as Referring Physician (Internal Medicine) Consuella Lose, MD as Consulting Physician (Neurosurgery) Mauro Kaufmann, RN as Oncology Nurse Navigator Rockwell Germany, RN as Oncology Nurse Navigator  CHIEF COMPLAINTS/PURPOSE OF CONSULTATION:  Newly diagnosed right breast DCIS  HISTORY OF PRESENTING ILLNESS:  Julia Poole 67 y.o. female is here because of recent diagnosis of right breast DCIS.  Patient had a previous DCIS in August 2010 that was treated with lumpectomy radiation and 5 years of tamoxifen.  She presented with a screening mammogram detected calcifications in the right breast which were once again DCIS.  She underwent a lumpectomy on 02/26/2022 and had clear margins and it was ER/PR positive.  She was referred to [place] for discussion regarding adjuvant treatment options.  I reviewed her records extensively and collaborated the history with the patient.  SUMMARY OF ONCOLOGIC HISTORY: Oncology History  Breast cancer, right breast ([place])  10/2008 Miscellaneous   Right breast DCIS status postlumpectomy, radiation and tamoxifen x 5 years   01/16/2016 Genetic Testing   ATM [id number] pathogenic variant, APC c.1631T>C and APC c.6873A>T VUSs found on the Common Hereditary cancer panel.  The Hereditary Gene Panel offered by [facility] includes sequencing and/or deletion duplication testing of the following 42 genes: APC, ATM, AXIN2, BARD1, BMPR1A, BRCA1, BRCA2, BRIP1, CDH1, CDKN2A, CHEK2, DICER1, EPCAM, GREM1, KIT, MEN1, MLH1, MSH2, MSH6, MUTYH, NBN, NF1, PALB2, PDGFRA, PMS2, POLD1, POLE, PTEN, RAD50, RAD51C, RAD51D, SDHA, SDHB, SDHC, SDHD, SMAD4, SMARCA4. STK11, TP53, TSC1, TSC2, and VHL.  The report date is January 15, 2016.  The APC c.1631T>C and APC c.6873A>T VUSs were both reclassified as Likely Benign.  The updated report date is  June 01, 2017.   01/21/2022 Initial Diagnosis   Screening mammogram detected calcifications in the right breast upper outer quadrant   02/26/2022 Surgery   Right lumpectomy: Scattered foci of DCIS intermediate grade discontinuously involving a fibrotic area of 0.9 cm.  No invasive cancer.  Margins negative.  Right additional superior medial margin excision: Focal DCIS intermediate grade margins negative ER 100%, PR 100%      MEDICAL HISTORY:  Past Medical History:  Diagnosis Date   Allergy    Arthritis    Bleeding nose    Cancer ([place]) 2010   right breast   Cancer ([place]) 09/2015   brain   Cataract    Degenerative disk disease 2012   Family history of breast cancer in female    Family history of colon cancer    Family history of ovarian cancer    Hemorrhoids    Hyperlipidemia    Sinus problem    Thyroid disease     SURGICAL HISTORY: Past Surgical History:  Procedure Laterality Date   BRAIN SURGERY     BREAST LUMPECTOMY WITH RADIOACTIVE SEED LOCALIZATION Right 02/26/2022   Procedure: RIGHT BREAST LUMPECTOMY WITH RADIOACTIVE SEED LOCALIZATION;  Surgeon: Rolm Bookbinder, MD;  Location: [facility];  Service: General;  Laterality: Right;   BREAST SURGERY  AUG. 2010   right breast lumpectomy   BREAST SURGERY     Cancer   COLONOSCOPY     CRANIOTOMY Left 10/16/2015   Procedure: PARA-SAGITTAL CRANIOTOMY WITH STEREOTACTIC NAVIGATION FOR TUMOR;  Surgeon: Kevan Ny Ditty, MD;  Location: [facility];  Service: Neurosurgery;  Laterality: Left;   GANGLION CYST EXCISION  SEP. 1997   RIGHT WRIST  SOCIAL HISTORY: Social History   Socioeconomic History   Marital status: Married    Spouse name: Jeneen Rinks   Number of children: 1   Years of education: 12   Highest education level: 12th grade  Occupational History   Occupation: Librarian, academic. [employer]  Tobacco Use   Smoking status: Never   Smokeless tobacco: Never  Vaping Use   Vaping Use: Never used  Substance and  Sexual Activity   Alcohol use: No   Drug use: No   Sexual activity: Yes    Birth control/protection: Post-menopausal  Other Topics Concern   Not on file  Social History Narrative   Married 27 years on 01/18/2021.   1 son and 2 step children, son and daughter.   Social Determinants of Health   Financial Resource Strain: Low Risk  (02/14/2021)   Overall Financial Resource Strain (CARDIA)    Difficulty of Paying Living Expenses: Not hard at all  Food Insecurity: No Food Insecurity (02/14/2021)   Hunger Vital Sign    Worried About Running Out of Food in the Last Year: Never true    Ran Out of Food in the Last Year: Never true  Transportation Needs: No Transportation Needs (02/14/2021)   PRAPARE - Hydrologist (Medical): No    Lack of Transportation (Non-Medical): No  Physical Activity: Sufficiently Active (02/14/2021)   Exercise Vital Sign    Days of Exercise per Week: 5 days    Minutes of Exercise per Session: 30 min  Stress: No Stress Concern Present (02/14/2021)   [place]    Feeling of Stress : Not at all  Social Connections: [place] (02/14/2021)   Social Connection and Isolation Panel [NHANES]    Frequency of Communication with Friends and Family: More than three times a week    Frequency of Social Gatherings with Friends and Family: More than three times a week    Attends Religious Services: 1 to 4 times per year    Active Member of [employer] or Organizations: Yes    Attends Archivist Meetings: 1 to 4 times per year    Marital Status: Married  Human resources officer Violence: Not At Risk (02/14/2021)   Humiliation, Afraid, Rape, and Kick questionnaire    Fear of Current or Ex-Partner: No    Emotionally Abused: No    Physically Abused: No    Sexually Abused: No    FAMILY HISTORY: Family History  Problem Relation Age of Onset   Colon polyps Mother    Colon  cancer Mother 12   Diabetes Father    Heart disease Father    Cancer Father        skin   Colon polyps Sister    Leukemia Sister 50       CLL   Breast cancer Brother 32   Cancer Brother        bone mets   Cancer Brother        skin   Ovarian cancer Maternal Aunt        dx in her 54s   Tuberculosis Maternal Grandmother    Heart disease Maternal Grandfather    Diabetes Paternal Grandmother    Alcohol abuse Paternal Grandfather    Esophageal cancer Neg Hx    Rectal cancer Neg Hx    Stomach cancer Neg Hx     ALLERGIES:  is allergic to norco [hydrocodone-acetaminophen].  MEDICATIONS:  Current Outpatient Medications  Medication Sig  Dispense Refill   acetaminophen-codeine (TYLENOL #3) 300-30 MG tablet Take 1 tablet by mouth every 4 (four) hours as needed for moderate pain.     albuterol (VENTOLIN HFA) 108 (90 Base) MCG/ACT inhaler Inhale 2 puffs into the lungs every 6 (six) hours as needed for wheezing or shortness of breath. 8 g 0   amoxicillin-clavulanate (AUGMENTIN) 875-125 MG tablet Take 1 tablet by mouth 2 (two) times daily. Take all of this medication 20 tablet 0   Ascorbic Acid (VITAMIN C) 500 MG tablet Take 500 mg by mouth daily.      aspirin 81 MG tablet Take 81 mg by mouth daily.       benzonatate (TESSALON) 200 MG capsule Take 1 capsule (200 mg total) by mouth 3 (three) times daily as needed for cough. 20 capsule 0   Biotin 10 MG CAPS Take by mouth.     Boswellia-Glucosamine-Vit D (OSTEO BI-FLEX ONE PER DAY PO) Take by mouth.     Calcium Carb-Cholecalciferol (CALTRATE 600+D3 PO) Take by mouth.     Cholecalciferol (VITAMIN D) 2000 UNITS tablet Take 2,000 Units by mouth 2 (two) times daily.     COCONUT OIL PO Take by mouth.     Cyanocobalamin (VITAMIN B 12 PO) Take by mouth.     FLAXSEED, LINSEED, PO Take by mouth.     Glucosamine-Chondroitin (OSTEO BI-FLEX REGULAR STRENGTH PO) Take by mouth.     Inulin (FIBER CHOICE) 1.5 g CHEW Chew by mouth.     levothyroxine  (SYNTHROID) 50 MCG tablet Take 1 tablet (50 mcg total) by mouth daily. 90 tablet 1   Multiple Vitamins-Minerals (CENTRUM SILVER PO) Take by mouth.       ondansetron (ZOFRAN-ODT) 4 MG disintegrating tablet Take 1 tablet (4 mg total) by mouth every 8 (eight) hours as needed for nausea or vomiting. 30 tablet 0   pseudoephedrine (SUDAFED) 30 MG tablet Take 30 mg by mouth every 4 (four) hours as needed for congestion.     rosuvastatin (CRESTOR) 10 MG tablet Take 1 tablet (10 mg total) by mouth daily. 90 tablet 1   TURMERIC PO Take by mouth.     vitamin E 180 MG (400 UNITS) capsule Take 400 Units by mouth daily.     Current Facility-Administered Medications  Medication Dose Route Frequency Provider Last Rate Last Admin   0.9 %  sodium chloride infusion  500 mL Intravenous Once Armbruster, Carlota Raspberry, MD        REVIEW OF SYSTEMS:   Constitutional: Denies fevers, chills or abnormal night sweats   All other systems were reviewed with the patient and are negative.  PHYSICAL EXAMINATION: ECOG PERFORMANCE STATUS: 1 - Symptomatic but completely ambulatory  There were no vitals filed for this visit. There were no vitals filed for this visit.  GENERAL:alert, no distress and comfortable    LABORATORY DATA:  I have reviewed the data as listed Lab Results  Component Value Date   WBC 6.1 01/24/2022   HGB 13.9 01/24/2022   HCT 41.5 01/24/2022   MCV 90 01/24/2022   PLT 269 01/24/2022   Lab Results  Component Value Date   NA 140 01/24/2022   K 4.2 01/24/2022   CL 103 01/24/2022   CO2 24 01/24/2022    RADIOGRAPHIC STUDIES: I have personally reviewed the radiological reports and agreed with the findings in the report.  ASSESSMENT AND PLAN:  Breast cancer, right breast August 2010: Right breast DCIS status postlumpectomy radiation and tamoxifen x 5 years  01/21/2022: Screening mammogram detected abnormality which on diagnostic mammogram showed group of coarse calcifications UOQ right  breast. 02/26/2022: Right lumpectomy: Scattered foci of DCIS intermediate grade discontinuously involving a fibrotic area of 0.9 cm.  No invasive cancer.  Margins negative.  Right additional superior medial margin excision: Focal DCIS intermediate grade margins negative ER 100%, PR 100%  Pathology counseling: I discussed the final pathology report of the patient provided  a copy of this report. I discussed the margins.  We also discussed the final staging along with previously performed ER/PR testing.  Treatment plan: Adjuvant antiestrogen therapy with anastrozole 1 mg daily x 5 years Since she had prior radiation she is not a candidate for radiation again.  Anastrozole counseling: We discussed the risks and benefits of anti-estrogen therapy with aromatase inhibitors. These include but not limited to insomnia, hot flashes, mood changes, vaginal dryness, bone density loss, and weight gain. We strongly believe that the benefits far outweigh the risks. Patient understands these risks and consented to starting treatment. Planned treatment duration is 5 years.   Return to clinic in 3 months for survivorship care plan visit  All questions were answered. The patient knows to call the clinic with any problems, questions or concerns.    Harriette Ohara, MD 04/12/22

## 2022-04-12 NOTE — Assessment & Plan Note (Addendum)
August 2010: Right breast DCIS status postlumpectomy radiation and tamoxifen x 5 years  01/21/2022: Screening mammogram detected abnormality which on diagnostic mammogram showed group of coarse calcifications UOQ right breast. 02/26/2022: Right lumpectomy: Scattered foci of DCIS intermediate grade discontinuously involving a fibrotic area of 0.9 cm.  No invasive cancer.  Margins negative.  Right additional superior medial margin excision: Focal DCIS intermediate grade margins negative ER 100%, PR 100%  Pathology counseling: I discussed the final pathology report of the patient provided  a copy of this report. I discussed the margins.  We also discussed the final staging along with previously performed ER/PR testing.  Treatment plan: Adjuvant radiation therapy Adjuvant antiestrogen therapy with tamoxifen  Return to clinic after radiation is complete to start antiestrogen therapy

## 2022-04-15 ENCOUNTER — Encounter: Payer: Self-pay | Admitting: *Deleted

## 2022-04-15 DIAGNOSIS — D0511 Intraductal carcinoma in situ of right breast: Secondary | ICD-10-CM

## 2022-04-17 ENCOUNTER — Telehealth: Payer: Self-pay | Admitting: Hematology and Oncology

## 2022-04-17 NOTE — Telephone Encounter (Signed)
Scheduled appointment per 1/12 los. Patient is aware.

## 2022-04-29 ENCOUNTER — Encounter: Payer: Self-pay | Admitting: *Deleted

## 2022-05-01 ENCOUNTER — Ambulatory Visit (INDEPENDENT_AMBULATORY_CARE_PROVIDER_SITE_OTHER): Payer: Medicare Other

## 2022-05-01 VITALS — Ht 65.0 in | Wt 182.0 lb

## 2022-05-01 DIAGNOSIS — Z0000 Encounter for general adult medical examination without abnormal findings: Secondary | ICD-10-CM | POA: Diagnosis not present

## 2022-05-01 NOTE — Progress Notes (Signed)
Subjective:   Julia Poole is a 67 y.o. female who presents for Medicare Annual (Subsequent) preventive examination.  I connected with  Tess Potts Dery on 05/01/22 by a audio enabled telemedicine application and verified that I am speaking with the correct person using two identifiers.  Patient Location: Home  Provider Location: Home Office  I discussed the limitations of evaluation and management by telemedicine. The patient expressed understanding and agreed to proceed.  Review of Systems     Cardiac Risk Factors include: advanced age (>28mn, >>5women);dyslipidemia     Objective:    Today's Vitals   05/01/22 1341  Weight: 182 lb (82.6 kg)  Height: '5\' 5"'$  (1.651 m)   Body mass index is 30.29 kg/m.     05/01/2022    1:42 PM 02/26/2022    7:41 AM 02/15/2022    1:50 PM 02/14/2021   10:37 AM 01/24/2020    1:21 PM 01/20/2019    1:35 PM 09/25/2017    2:46 PM  Advanced Directives  Does Patient Have a Medical Advance Directive? No No No No No No No  Would patient like information on creating a medical advance directive? No - Patient declined No - Patient declined No - Patient declined No - Patient declined No - Patient declined No - Patient declined No - Patient declined    Current Medications (verified) Outpatient Encounter Medications as of 05/01/2022  Medication Sig   acetaminophen-codeine (TYLENOL #3) 300-30 MG tablet Take 1 tablet by mouth every 4 (four) hours as needed for moderate pain.   anastrozole (ARIMIDEX) 1 MG tablet Take 1 tablet (1 mg total) by mouth daily.   Ascorbic Acid (VITAMIN C) 500 MG tablet Take 500 mg by mouth daily.    aspirin 81 MG tablet Take 81 mg by mouth daily.     Biotin 10 MG CAPS Take by mouth.   Boswellia-Glucosamine-Vit D (OSTEO BI-FLEX ONE PER DAY PO) Take by mouth.   Cholecalciferol (VITAMIN D) 2000 UNITS tablet Take 2,000 Units by mouth 2 (two) times daily.   COCONUT OIL PO Take by mouth.   Cyanocobalamin (VITAMIN B 12 PO) Take by  mouth.   FLAXSEED, LINSEED, PO Take by mouth.   Glucosamine-Chondroitin (OSTEO BI-FLEX REGULAR STRENGTH PO) Take by mouth.   Inulin (FIBER CHOICE) 1.5 g CHEW Chew by mouth.   levothyroxine (SYNTHROID) 50 MCG tablet Take 1 tablet (50 mcg total) by mouth daily.   Multiple Vitamins-Minerals (CENTRUM SILVER PO) Take by mouth.     pseudoephedrine (SUDAFED) 30 MG tablet Take 30 mg by mouth every 4 (four) hours as needed for congestion.   rosuvastatin (CRESTOR) 10 MG tablet Take 1 tablet (10 mg total) by mouth daily.   TURMERIC PO Take by mouth.   vitamin E 180 MG (400 UNITS) capsule Take 400 Units by mouth daily.   Facility-Administered Encounter Medications as of 05/01/2022  Medication   0.9 %  sodium chloride infusion    Allergies (verified) Norco [hydrocodone-acetaminophen]   History: Past Medical History:  Diagnosis Date   Allergy    Arthritis    Bleeding nose    [place] [place]   [place]   [place] [place]   brain   Cataract    Degenerative disk disease 2012   Family history of breast cancer in female    Family history of colon cancer    Family history of ovarian cancer    Hemorrhoids    Hyperlipidemia    Sinus problem  Thyroid disease    Past Surgical History:  Procedure Laterality Date   BRAIN SURGERY     BREAST LUMPECTOMY WITH RADIOACTIVE SEED LOCALIZATION Right 02/26/2022   Procedure: RIGHT BREAST LUMPECTOMY WITH RADIOACTIVE SEED LOCALIZATION;  Surgeon: Rolm Bookbinder, MD;  Location: [facility];  Service: General;  Laterality: Right;   BREAST SURGERY  AUG. 2010   right breast lumpectomy   BREAST SURGERY     Cancer   COLONOSCOPY     CRANIOTOMY Left 10/16/2015   Procedure: PARA-SAGITTAL CRANIOTOMY WITH STEREOTACTIC NAVIGATION FOR TUMOR;  Surgeon: Kevan Ny Ditty, MD;  Location: [facility];  Service: Neurosurgery;  Laterality: Left;   GANGLION CYST EXCISION  SEP. 1997   RIGHT WRIST   Family History  Problem Relation Age of  Onset   Colon polyps Mother    Colon cancer Mother 5   Diabetes Father    Heart disease Father    Cancer Father        skin   Colon polyps Sister    Leukemia Sister 72       CLL   Breast cancer Brother 70   Cancer Brother        bone mets   Cancer Brother        skin   Ovarian cancer Maternal Aunt        dx in her 32s   Tuberculosis Maternal Grandmother    Heart disease Maternal Grandfather    Diabetes Paternal Grandmother    Alcohol abuse Paternal Grandfather    Esophageal cancer Neg Hx    Rectal cancer Neg Hx    Stomach cancer Neg Hx    Social History   Socioeconomic History   Marital status: Married    Spouse name: Jeneen Rinks   Number of children: 1   Years of education: 12   Highest education level: 12th grade  Occupational History   Occupation: Librarian, academic. [employer]  Tobacco Use   Smoking status: Never   Smokeless tobacco: Never  Vaping Use   Vaping Use: Never used  Substance and Sexual Activity   Alcohol use: No   Drug use: No   Sexual activity: Yes    Birth control/protection: Post-menopausal  Other Topics Concern   Not on file  Social History Narrative   Married 27 years on 01/18/2021.   1 son and 2 step children, son and daughter.   Social Determinants of Health   Financial Resource Strain: Low Risk  (05/01/2022)   Overall Financial Resource Strain (CARDIA)    Difficulty of Paying Living Expenses: Not hard at all  Food Insecurity: No Food Insecurity (05/01/2022)   Hunger Vital Sign    Worried About Running Out of Food in the Last Year: Never true    Ran Out of Food in the Last Year: Never true  Transportation Needs: No Transportation Needs (05/01/2022)   PRAPARE - Hydrologist (Medical): No    Lack of Transportation (Non-Medical): No  Physical Activity: Insufficiently Active (05/01/2022)   Exercise Vital Sign    Days of Exercise per Week: 3 days    Minutes of Exercise per Session: 30 min  Stress: No Stress Concern Present  (05/01/2022)   [place]    Feeling of Stress : Not at all  Social Connections: [place] (05/01/2022)   Social Connection and Isolation Panel [NHANES]    Frequency of Communication with Friends and Family: More than three times a  week    Frequency of Social Gatherings with Friends and Family: Three times a week    Attends Religious Services: More than 4 times per year    Active Member of Clubs or Organizations: Yes    Attends Music therapist: More than 4 times per year    Marital Status: Married    Tobacco Counseling Counseling given: Not Answered   Clinical Intake:  Pre-visit preparation completed: Yes  Pain : No/denies pain  Diabetes: No  How often do you need to have someone help you when you read instructions, pamphlets, or other written materials from your doctor or pharmacy?: 1 - Never  Diabetic?No   Interpreter Needed?: No  Information entered by :: Denman George LPN   Activities of Daily Living    05/01/2022    1:42 PM 02/26/2022    7:44 AM  In your present state of health, do you have any difficulty performing the following activities:  Hearing? 0 0  Vision? 0 0  Difficulty concentrating or making decisions? 0 0  Walking or climbing stairs? 0 0  Dressing or bathing? 0 0  Doing errands, shopping? 0   Preparing Food and eating ? N   Using the Toilet? N   In the past six months, have you accidently leaked urine? N   Do you have problems with loss of bowel control? N   Managing your Medications? N   Managing your Finances? N   Housekeeping or managing your Housekeeping? N     Patient Care Team: Chevis Pretty, FNP as PCP - General (Nurse Practitioner) Merilyn Baba, MD as Referring Physician (Internal Medicine) Consuella Lose, MD as Consulting Physician (Neurosurgery) Mauro Kaufmann, RN as Oncology Nurse Navigator Rockwell Germany, RN as Oncology  Nurse Navigator Nicholas Lose, MD as Consulting Physician (Hematology and Oncology) [facility] Vania Rea, MD as Consulting Physician (Obstetrics and Gynecology)  Indicate any recent Medical Services you may have received from other than [facility] providers in the past year (date may be approximate).     Assessment:   This is a routine wellness examination for Julia Poole.  Hearing/Vision screen Hearing Screening - Comments:: Denies hearing difficulties  Vision Screening - Comments:: Wears rx glasses - up to date with routine eye exams with Dr. Manuella Ghazi    Dietary issues and exercise activities discussed: Current Exercise Habits: Home exercise routine, Type of exercise: walking, Time (Minutes): 30, Frequency (Times/Week): 3, Weekly Exercise (Minutes/Week): 90, Intensity: Mild   Goals Addressed             This Visit's Progress    COMPLETED: <enter goal here>       Stay Healthy Stay Cancer Free     COMPLETED: Patient Stated       01/24/2020 AWV Goal: Fall Prevention  Over the next year, patient will decrease their risk for falls by: Using assistive devices, such as a cane or walker, as needed Identifying fall risks within their home and correcting them by: Removing throw rugs Adding handrails to stairs or ramps Removing clutter and keeping a clear pathway throughout the home Increasing light, especially at night Adding shower handles/bars Raising toilet seat Identifying potential personal risk factors for falls: Medication side effects Incontinence/urgency Vestibular dysfunction Hearing loss Musculoskeletal disorders Neurological disorders Orthostatic hypotension       COMPLETED: Weight (lb) < 200 lb (90.7 kg)   182 lb (82.6 kg)    Set Goal of loseing 20 pds this year  Depression Screen    05/01/2022    1:45 PM 01/24/2022   10:09 AM 07/27/2021   11:54 AM 04/30/2021   10:24 AM 02/14/2021   10:28 AM 05/01/2020   11:28 AM 01/27/2020   10:10 AM  PHQ  2/9 Scores  PHQ - 2 Score 0  0 0 0 0 0  PHQ- 9 Score   0 0     Exception Documentation  Patient refusal         Fall Risk    05/01/2022    1:42 PM 07/27/2021   11:54 AM 04/30/2021   10:24 AM 02/14/2021   10:37 AM 05/01/2020   11:28 AM  Fall Risk   Falls in the past year? 0 0 0 0 0  Number falls in past yr: 0   0   Injury with Fall? 0   0   Risk for fall due to :    Impaired balance/gait;Impaired mobility;Orthopedic patient   Follow up Falls prevention discussed;Education provided;Falls evaluation completed   Falls prevention discussed     FALL RISK PREVENTION PERTAINING TO THE HOME:  Any stairs in or around the home? Yes  If so, are there any without handrails? No  Home free of loose throw rugs in walkways, pet beds, electrical cords, etc? Yes  Adequate lighting in your home to reduce risk of falls? Yes   ASSISTIVE DEVICES UTILIZED TO PREVENT FALLS:  Life alert? No  Use of a cane, walker or w/c? No  Grab bars in the bathroom? Yes  Shower chair or bench in shower? No  Elevated toilet seat or a handicapped toilet? Yes   TIMED UP AND GO:  Was the test performed? No . Telephonic visit   Cognitive Function:    09/25/2017    3:18 PM 08/28/2016    4:22 PM  MMSE - Mini Mental State Exam  Orientation to time 4 5  Orientation to Place 5 5  Registration 3 3  Attention/ Calculation 5 5  Recall 3 3  Language- name 2 objects 2 2  Language- repeat 1 1  Language- follow 3 step command 3 3  Language- read & follow direction 1 1  Write a sentence 1 1  Copy design 1 1  Total score 29 30        05/01/2022    1:43 PM 02/14/2021   10:46 AM 01/24/2020    1:30 PM 01/20/2019    1:38 PM  6CIT Screen  What Year? 0 points 0 points  0 points  What month? 0 points 0 points  0 points  What time? 0 points 0 points 0 points 0 points  Count back from 20 0 points 0 points 0 points 0 points  Months in reverse 0 points 0 points 0 points 0 points  Repeat phrase 0 points 0 points 2 points 0  points  Total Score 0 points 0 points  0 points    Immunizations Immunization History  Administered Date(s) Administered   Fluad Quad(high Dose 65+) 01/27/2019, 01/27/2020, 02/08/2021, 01/24/2022   Influenza,inj,Quad PF,6+ Mos 01/01/2013, 01/19/2014, 02/09/2015, 02/26/2016, 01/06/2018   Influenza-Unspecified 12/30/2013   Moderna Sars-Covid-2 Vaccination 08/16/2019, 09/13/2019, 05/02/2020   Pneumococcal Conjugate-13 01/27/2019   Pneumococcal Polysaccharide-23 01/27/2020   Zoster Recombinat (Shingrix) 04/30/2021, 07/27/2021   Zoster, Live 05/19/2015    TDAP status: Due, Education has been provided regarding the importance of this vaccine. Advised may receive this vaccine at local pharmacy or Health Dept. Aware to provide a copy of the vaccination record  if obtained from local pharmacy or Health Dept. Verbalized acceptance and understanding.  Flu Vaccine status: Up to date  Pneumococcal vaccine status: Up to date  Covid-19 vaccine status: Information provided on how to obtain vaccines.   Qualifies for Shingles Vaccine? Yes   Zostavax completed No   Shingrix Completed?: Yes  Screening Tests Health Maintenance  Topic Date Due   DTaP/Tdap/Td (1 - Tdap) Never done   COVID-19 Vaccine (4 - 2023-24 season) 11/30/2021   DEXA SCAN  05/02/2023   Medicare Annual Wellness (AWV)  05/02/2023   MAMMOGRAM  01/02/2024   COLONOSCOPY (Pts 45-66yr Insurance coverage will need to be confirmed)  12/08/2026   Pneumonia Vaccine 67 Years old  Completed   INFLUENZA VACCINE  Completed   Hepatitis C Screening  Completed   Zoster Vaccines- Shingrix  Completed   HPV VACCINES  Aged Out    Health Maintenance  Health Maintenance Due  Topic Date Due   DTaP/Tdap/Td (1 - Tdap) Never done   COVID-19 Vaccine (4 - 2023-24 season) 11/30/2021    Colorectal cancer screening: Type of screening: Colonoscopy. Completed 12/07/21. Repeat every 5 years  Mammogram status: Completed 01/01/22. Repeat every  year  Bone Density status: Completed 05/01/21. Results reflect: Bone density results: NORMAL. Repeat every 2 years.  Lung Cancer Screening: (Low Dose CT Chest recommended if Age 69-80years, 30 pack-year currently smoking OR have quit w/in 15years.) does not qualify.   Lung Cancer Screening Referral: n/a   Additional Screening:  Hepatitis C Screening: does qualify; Completed 02/09/15  Vision Screening: Recommended annual ophthalmology exams for early detection of glaucoma and other disorders of the eye. Is the patient up to date with their annual eye exam?  Yes  Who is the provider or what is the name of the office in which the patient attends annual eye exams? Dr. SManuella GhaziIf pt is not established with a provider, would they like to be referred to a provider to establish care? No .   Dental Screening: Recommended annual dental exams for proper oral hygiene  Community Resource Referral / Chronic Care Management: CRR required this visit?  No   CCM required this visit?  No      Plan:     I have personally reviewed and noted the following in the patient's chart:   Medical and social history Use of alcohol, tobacco or illicit drugs  Current medications and supplements including opioid prescriptions. Patient is not currently taking opioid prescriptions. Functional ability and status Nutritional status Physical activity Advanced directives List of other physicians Hospitalizations, surgeries, and ER visits in previous 12 months Vitals Screenings to include cognitive, depression, and falls Referrals and appointments  In addition, I have reviewed and discussed with patient certain preventive protocols, quality metrics, and best practice recommendations. A written personalized care plan for preventive services as well as general preventive health recommendations were provided to patient.     SVanetta Mulders [place]  [id number]  Due to this being a virtual visit, the after  visit summary with patients personalized plan was offered to patient via mail or my-chart. per request, patient was mailed a copy of AVS.  Nurse Notes: No concerns

## 2022-05-01 NOTE — Patient Instructions (Signed)
Julia Poole , Thank you for taking time to come for your Medicare Wellness Visit. I appreciate your ongoing commitment to your health goals. Please review the following plan we discussed and let me know if I can assist you in the future.   These are the goals we discussed:  Goals      Exercise 150 min/wk Moderate Activity     Have 3 meals a day     Eat More at home Increase fruits and vegetables  Increase lean proteins Goal Weight 170 lbs         This is a list of the screening recommended for you and due dates:  Health Maintenance  Topic Date Due   DTaP/Tdap/Td vaccine (1 - Tdap) Never done   COVID-19 Vaccine (4 - 2023-24 season) 11/30/2021   DEXA scan (bone density measurement)  05/02/2023   Medicare Annual Wellness Visit  05/02/2023   Mammogram  01/02/2024   Colon Cancer Screening  12/08/2026   Pneumonia Vaccine  Completed   Flu Shot  Completed   Hepatitis C Screening: USPSTF Recommendation to screen - Ages 18-79 yo.  Completed   Zoster (Shingles) Vaccine  Completed   HPV Vaccine  Aged Out    Advanced directives: Forms are available if you choose in the future to pursue completion.  This is recommended in order to make sure that your health wishes are honored in the event that you are unable to verbalize them to the provider.    Conditions/risks identified: Aim for 30 minutes of exercise or brisk walking, 6-8 glasses of water, and 5 servings of fruits and vegetables each day.   Next appointment: Follow up in one year for your annual wellness visit    Preventive Care 65 Years and Older, Female Preventive care refers to lifestyle choices and visits with your health care provider that can promote health and wellness. What does preventive care include? A yearly physical exam. This is also called an annual well check. Dental exams once or twice a year. Routine eye exams. Ask your health care provider how often you should have your eyes checked. Personal lifestyle choices,  including: Daily care of your teeth and gums. Regular physical activity. Eating a healthy diet. Avoiding tobacco and drug use. Limiting alcohol use. Practicing safe sex. Taking low-dose aspirin every day. Taking vitamin and mineral supplements as recommended by your health care provider. What happens during an annual well check? The services and screenings done by your health care provider during your annual well check will depend on your age, overall health, lifestyle risk factors, and family history of disease. Counseling  Your health care provider may ask you questions about your: Alcohol use. Tobacco use. Drug use. Emotional well-being. Home and relationship well-being. Sexual activity. Eating habits. History of falls. Memory and ability to understand (cognition). Work and work Statistician. Reproductive health. Screening  You may have the following tests or measurements: Height, weight, and BMI. Blood pressure. Lipid and cholesterol levels. These may be checked every 5 years, or more frequently if you are over 32 years old. Skin check. Lung cancer screening. You may have this screening every year starting at age 90 if you have a 30-pack-year history of smoking and currently smoke or have quit within the past 15 years. Fecal occult blood test (FOBT) of the stool. You may have this test every year starting at age 88. Flexible sigmoidoscopy or colonoscopy. You may have a sigmoidoscopy every 5 years or a colonoscopy every 10 years starting  at age 33. Hepatitis C blood test. Hepatitis B blood test. Sexually transmitted disease (STD) testing. Diabetes screening. This is done by checking your blood sugar (glucose) after you have not eaten for a while (fasting). You may have this done every 1-3 years. Bone density scan. This is done to screen for osteoporosis. You may have this done starting at age 60. Mammogram. This may be done every 1-2 years. Talk to your health care provider  about how often you should have regular mammograms. Talk with your health care provider about your test results, treatment options, and if necessary, the need for more tests. Vaccines  Your health care provider may recommend certain vaccines, such as: Influenza vaccine. This is recommended every year. Tetanus, diphtheria, and acellular pertussis (Tdap, Td) vaccine. You may need a Td booster every 10 years. Zoster vaccine. You may need this after age 39. Pneumococcal 13-valent conjugate (PCV13) vaccine. One dose is recommended after age 25. Pneumococcal polysaccharide (PPSV23) vaccine. One dose is recommended after age 75. Talk to your health care provider about which screenings and vaccines you need and how often you need them. This information is not intended to replace advice given to you by your health care provider. Make sure you discuss any questions you have with your health care provider. Document Released: 04/14/2015 Document Revised: 12/06/2015 Document Reviewed: 01/17/2015 Elsevier Interactive Patient Education  2017 [employer] Prevention in the Home Falls can cause injuries. They can happen to people of all ages. There are many things you can do to make your home safe and to help prevent falls. What can I do on the outside of my home? Regularly fix the edges of walkways and driveways and fix any cracks. Remove anything that might make you trip as you walk through a door, such as a raised step or threshold. Trim any bushes or trees on the path to your home. Use bright outdoor lighting. Clear any walking paths of anything that might make someone trip, such as rocks or tools. Regularly check to see if handrails are loose or broken. Make sure that both sides of any steps have handrails. Any raised decks and porches should have guardrails on the edges. Have any leaves, snow, or ice cleared regularly. Use sand or salt on walking paths during winter. Clean up any spills in  your garage right away. This includes oil or grease spills. What can I do in the bathroom? Use night lights. Install grab bars by the toilet and in the tub and shower. Do not use towel bars as grab bars. Use non-skid mats or decals in the tub or shower. If you need to sit down in the shower, use a plastic, non-slip stool. Keep the floor dry. Clean up any water that spills on the floor as soon as it happens. Remove soap buildup in the tub or shower regularly. Attach bath mats securely with double-sided non-slip rug tape. Do not have throw rugs and other things on the floor that can make you trip. What can I do in the bedroom? Use night lights. Make sure that you have a light by your bed that is easy to reach. Do not use any sheets or blankets that are too big for your bed. They should not hang down onto the floor. Have a firm chair that has side arms. You can use this for support while you get dressed. Do not have throw rugs and other things on the floor that can make you trip. What can  I do in the kitchen? Clean up any spills right away. Avoid walking on wet floors. Keep items that you use a lot in easy-to-reach places. If you need to reach something above you, use a strong step stool that has a grab bar. Keep electrical cords out of the way. Do not use floor polish or wax that makes floors slippery. If you must use wax, use non-skid floor wax. Do not have throw rugs and other things on the floor that can make you trip. What can I do with my stairs? Do not leave any items on the stairs. Make sure that there are handrails on both sides of the stairs and use them. Fix handrails that are broken or loose. Make sure that handrails are as long as the stairways. Check any carpeting to make sure that it is firmly attached to the stairs. Fix any carpet that is loose or worn. Avoid having throw rugs at the top or bottom of the stairs. If you do have throw rugs, attach them to the floor with carpet  tape. Make sure that you have a light switch at the top of the stairs and the bottom of the stairs. If you do not have them, ask someone to add them for you. What else can I do to help prevent falls? Wear shoes that: Do not have high heels. Have rubber bottoms. Are comfortable and fit you well. Are closed at the toe. Do not wear sandals. If you use a stepladder: Make sure that it is fully opened. Do not climb a closed stepladder. Make sure that both sides of the stepladder are locked into place. Ask someone to hold it for you, if possible. Clearly mark and make sure that you can see: Any grab bars or handrails. First and last steps. Where the edge of each step is. Use tools that help you move around (mobility aids) if they are needed. These include: Canes. Walkers. Scooters. Crutches. Turn on the lights when you go into a dark area. Replace any light bulbs as soon as they burn out. Set up your furniture so you have a clear path. Avoid moving your furniture around. If any of your floors are uneven, fix them. If there are any pets around you, be aware of where they are. Review your medicines with your doctor. Some medicines can make you feel dizzy. This can increase your chance of falling. Ask your doctor what other things that you can do to help prevent falls. This information is not intended to replace advice given to you by your health care provider. Make sure you discuss any questions you have with your health care provider. Document Released: 01/12/2009 Document Revised: 08/24/2015 Document Reviewed: 04/22/2014 Elsevier Interactive Patient Education  2017 [employer].

## 2022-07-01 DIAGNOSIS — D0511 Intraductal carcinoma in situ of right breast: Secondary | ICD-10-CM | POA: Diagnosis not present

## 2022-07-12 ENCOUNTER — Inpatient Hospital Stay: Payer: Medicare Other | Attending: Hematology and Oncology | Admitting: Adult Health

## 2022-07-12 ENCOUNTER — Encounter: Payer: Self-pay | Admitting: Adult Health

## 2022-07-12 ENCOUNTER — Other Ambulatory Visit: Payer: Self-pay

## 2022-07-12 VITALS — BP 128/64 | HR 80 | Temp 97.8°F | Resp 16 | Ht 65.0 in | Wt 188.2 lb

## 2022-07-12 DIAGNOSIS — Z8249 Family history of ischemic heart disease and other diseases of the circulatory system: Secondary | ICD-10-CM | POA: Insufficient documentation

## 2022-07-12 DIAGNOSIS — Z83719 Family history of colon polyps, unspecified: Secondary | ICD-10-CM | POA: Insufficient documentation

## 2022-07-12 DIAGNOSIS — Z1509 Genetic susceptibility to other malignant neoplasm: Secondary | ICD-10-CM

## 2022-07-12 DIAGNOSIS — E079 Disorder of thyroid, unspecified: Secondary | ICD-10-CM | POA: Insufficient documentation

## 2022-07-12 DIAGNOSIS — Z170 Estrogen receptor positive status [ER+]: Secondary | ICD-10-CM | POA: Diagnosis not present

## 2022-07-12 DIAGNOSIS — Z825 Family history of asthma and other chronic lower respiratory diseases: Secondary | ICD-10-CM | POA: Diagnosis not present

## 2022-07-12 DIAGNOSIS — Z8719 Personal history of other diseases of the digestive system: Secondary | ICD-10-CM | POA: Diagnosis not present

## 2022-07-12 DIAGNOSIS — Z833 Family history of diabetes mellitus: Secondary | ICD-10-CM | POA: Diagnosis not present

## 2022-07-12 DIAGNOSIS — E785 Hyperlipidemia, unspecified: Secondary | ICD-10-CM | POA: Diagnosis not present

## 2022-07-12 DIAGNOSIS — Z811 Family history of alcohol abuse and dependence: Secondary | ICD-10-CM | POA: Diagnosis not present

## 2022-07-12 DIAGNOSIS — Z1501 Genetic susceptibility to malignant neoplasm of breast: Secondary | ICD-10-CM

## 2022-07-12 DIAGNOSIS — Z803 Family history of malignant neoplasm of breast: Secondary | ICD-10-CM | POA: Diagnosis not present

## 2022-07-12 DIAGNOSIS — Z885 Allergy status to narcotic agent status: Secondary | ICD-10-CM | POA: Insufficient documentation

## 2022-07-12 DIAGNOSIS — Z79899 Other long term (current) drug therapy: Secondary | ICD-10-CM | POA: Insufficient documentation

## 2022-07-12 DIAGNOSIS — Z79811 Long term (current) use of aromatase inhibitors: Secondary | ICD-10-CM | POA: Diagnosis not present

## 2022-07-12 DIAGNOSIS — D0511 Intraductal carcinoma in situ of right breast: Secondary | ICD-10-CM | POA: Diagnosis not present

## 2022-07-12 DIAGNOSIS — Z1502 Genetic susceptibility to malignant neoplasm of ovary: Secondary | ICD-10-CM | POA: Insufficient documentation

## 2022-07-12 DIAGNOSIS — Z800 Family history of malignant neoplasm of digestive organs: Secondary | ICD-10-CM | POA: Insufficient documentation

## 2022-07-12 NOTE — Progress Notes (Signed)
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  The Hereditary Gene Panel offered by [facility] includes sequencing and/or deletion duplication testing of the following 42 genes: APC, ATM, AXIN2, BARD1, BMPR1A, BRCA1, BRCA2, BRIP1, CDH1, CDKN2A, CHEK2, DICER1, EPCAM, GREM1, KIT, MEN1, MLH1, MSH2, MSH6, MUTYH, NBN, NF1, PALB2, PDGFRA, PMS2, POLD1, POLE, PTEN, RAD50, RAD51C, RAD51D, SDHA, SDHB, SDHC, SDHD, SMAD4, SMARCA4. STK11, TP53, TSC1, TSC2, and VHL.  The report date is January 15, 2016.  The APC c.1631T>C and APC c.6873A>T VUSs were both reclassified as Likely Benign.  The updated report date is June 01, 2017.   01/21/2022 Initial Diagnosis   Screening mammogram detected calcifications in the right breast upper outer quadrant   02/26/2022 Surgery   Right lumpectomy: Scattered foci of DCIS intermediate grade discontinuously involving a fibrotic area of 0.9 cm.  No invasive cancer.  Margins negative.  Right additional superior medial margin excision: Focal DCIS intermediate grade margins negative ER 100%, PR 100%   04/2022 -  Anti-estrogen oral therapy   Anastrozole     INTERVAL HISTORY:  Julia Poole to review her survivorship care plan detailing her treatment course for breast cancer, as well as monitoring long-term side effects of that treatment, education regarding health maintenance, screening, and overall wellness and health promotion.     Overall, Julia Poole reports feeling quite well she is taking anastrozole with tolerance.  REVIEW OF SYSTEMS:  Review of Systems  Constitutional:  Negative for appetite change, chills, fatigue, fever and unexpected weight change.  HENT:   Negative for  hearing loss, lump/mass and trouble swallowing.   Eyes:  Negative for eye problems and icterus.  Respiratory:  Negative for chest tightness, cough and shortness of breath.   Cardiovascular:  Negative for chest pain, leg swelling and palpitations.  Gastrointestinal:  Negative for abdominal distention, abdominal pain, constipation, diarrhea, nausea and vomiting.  Endocrine: Negative for hot flashes.  Genitourinary:  Negative for difficulty urinating.   Musculoskeletal:  Negative for arthralgias.  Skin:  Negative for itching and rash.  Neurological:  Negative for dizziness, extremity weakness, headaches and numbness.  Hematological:  Negative for adenopathy. Does not bruise/bleed easily.  Psychiatric/Behavioral:  Negative for depression. The patient is not nervous/anxious.   Breast: Denies any new nodularity, masses, tenderness, nipple changes, or nipple discharge.     PAST MEDICAL/SURGICAL HISTORY:  Past Medical History:  Diagnosis Date   Allergy    Arthritis    Bleeding nose    Cancer (HCC) 2010   right breast   Cancer (HCC) 09/2015   brain   Cataract    Degenerative disk disease 2012   Family history of breast cancer in female    Family history of colon cancer    Family history of ovarian cancer    Hemorrhoids    Hyperlipidemia    Sinus problem    Thyroid disease    Past Surgical History:  Procedure Laterality Date   BRAIN SURGERY     BREAST LUMPECTOMY WITH RADIOACTIVE SEED LOCALIZATION Right 02/26/2022   Procedure: RIGHT BREAST LUMPECTOMY WITH RADIOACTIVE SEED LOCALIZATION;  Surgeon: Emelia Loron, MD;  Location: [facility];  Service: General;  Laterality: Right;   BREAST SURGERY  AUG. 2010   right  breast lumpectomy   BREAST SURGERY     Cancer   COLONOSCOPY     CRANIOTOMY Left 10/16/2015   Procedure: PARA-SAGITTAL CRANIOTOMY WITH STEREOTACTIC NAVIGATION FOR TUMOR;  Surgeon: Loura Halt Ditty, MD;  Location: [facility];  Service: Neurosurgery;   Laterality: Left;   GANGLION CYST EXCISION  SEP. 1997   RIGHT WRIST     ALLERGIES:  Allergies  Allergen Reactions   Norco [Hydrocodone-Acetaminophen] Nausea Only     CURRENT MEDICATIONS:  Outpatient Encounter Medications as of 07/12/2022  Medication Sig   acetaminophen-codeine (TYLENOL #3) 300-30 MG tablet Take 1 tablet by mouth every 4 (four) hours as needed for moderate pain.   anastrozole (ARIMIDEX) 1 MG tablet Take 1 tablet (1 mg total) by mouth daily.   Ascorbic Acid (VITAMIN C) 500 MG tablet Take 500 mg by mouth daily.    aspirin 81 MG tablet Take 81 mg by mouth daily.     Biotin 10 MG CAPS Take by mouth.   Boswellia-Glucosamine-Vit D (OSTEO BI-FLEX ONE PER DAY PO) Take by mouth.   Cholecalciferol (VITAMIN D) 2000 UNITS tablet Take 2,000 Units by mouth 2 (two) times daily.   COCONUT OIL PO Take by mouth.   Cyanocobalamin (VITAMIN B 12 PO) Take by mouth.   FLAXSEED, LINSEED, PO Take by mouth.   Glucosamine-Chondroitin (OSTEO BI-FLEX REGULAR STRENGTH PO) Take by mouth.   levothyroxine (SYNTHROID) 50 MCG tablet Take 1 tablet (50 mcg total) by mouth daily.   Multiple Vitamins-Minerals (CENTRUM SILVER PO) Take by mouth.     pseudoephedrine (SUDAFED) 30 MG tablet Take 30 mg by mouth every 4 (four) hours as needed for congestion.   psyllium (REGULOID) 0.52 g capsule Take 0.52 g by mouth daily.   rosuvastatin (CRESTOR) 10 MG tablet Take 1 tablet (10 mg total) by mouth daily.   TURMERIC PO Take by mouth.   vitamin E 180 MG (400 UNITS) capsule Take 400 Units by mouth daily.   [DISCONTINUED] Inulin (FIBER CHOICE) 1.5 g CHEW Chew by mouth.   Facility-Administered Encounter Medications as of 07/12/2022  Medication   0.9 %  sodium chloride infusion     ONCOLOGIC FAMILY HISTORY:  Family History  Problem Relation Age of Onset   Colon polyps Mother    Colon cancer Mother 15   Diabetes Father    Heart disease Father    Cancer Father        skin   Colon polyps Sister    Leukemia  Sister 22       CLL   Breast cancer Brother 58   Cancer Brother        bone mets   Cancer Brother        skin   Ovarian cancer Maternal Aunt        dx in her 30s   Tuberculosis Maternal Grandmother    Heart disease Maternal Grandfather    Diabetes Paternal Grandmother    Alcohol abuse Paternal Grandfather    Esophageal cancer Neg Hx    Rectal cancer Neg Hx    Stomach cancer Neg Hx      SOCIAL HISTORY:  Social History   Socioeconomic History   Marital status: Married    Spouse name: Fayrene Fearing   Number of children: 1   Years of education: 12   Highest education level: 12th grade  Occupational History   Occupation: Engineer, structural. [employer]  Tobacco Use   Smoking status: Never   Smokeless tobacco: Never  Vaping Use  Vaping Use: Never used  Substance and Sexual Activity   Alcohol use: No   Drug use: No   Sexual activity: Yes    Birth control/protection: Post-menopausal  Other Topics Concern   Not on file  Social History Narrative   Married 27 years on 01/18/2021.   1 son and 2 step children, son and daughter.   Social Determinants of Health   Financial Resource Strain: Low Risk  (05/01/2022)   Overall Financial Resource Strain (CARDIA)    Difficulty of Paying Living Expenses: Not hard at all  Food Insecurity: No Food Insecurity (05/01/2022)   Hunger Vital Sign    Worried About Running Out of Food in the Last Year: Never true    Ran Out of Food in the Last Year: Never true  Transportation Needs: No Transportation Needs (05/01/2022)   PRAPARE - Administrator, Civil Service (Medical): No    Lack of Transportation (Non-Medical): No  Physical Activity: Insufficiently Active (05/01/2022)   Exercise Vital Sign    Days of Exercise per Week: 3 days    Minutes of Exercise per Session: 30 min  Stress: No Stress Concern Present (05/01/2022)   [employer] of Occupational Health - Occupational Stress Questionnaire    Feeling of Stress : Not at all  Social Connections:  Socially Integrated (05/01/2022)   Social Connection and Isolation Panel [NHANES]    Frequency of Communication with Friends and Family: More than three times a week    Frequency of Social Gatherings with Friends and Family: Three times a week    Attends Religious Services: More than 4 times per year    Active Member of Clubs or Organizations: Yes    Attends Banker Meetings: More than 4 times per year    Marital Status: Married  Catering manager Violence: Not At Risk (05/01/2022)   Humiliation, Afraid, Rape, and Kick questionnaire    Fear of Current or Ex-Partner: No    Emotionally Abused: No    Physically Abused: No    Sexually Abused: No     OBSERVATIONS/OBJECTIVE:  BP 128/64 (BP Location: Left Arm, Patient Position: Sitting)   Pulse 80   Temp 97.8 F (36.6 C) (Temporal)   Resp 16   Ht 5\' 5"  (1.651 m)   Wt 188 lb 3.2 oz (85.4 kg)   SpO2 100%   BMI 31.32 kg/m  GENERAL: Patient is a well appearing female in no acute distress HEENT:  Sclerae anicteric.  Oropharynx clear and moist. No ulcerations or evidence of oropharyngeal candidiasis. Neck is supple.  NODES:  No cervical, supraclavicular, or axillary lymphadenopathy palpated.  BREAST EXAM: Left breast status postlumpectomy and radiation no sign of local recurrence right breast is benign. LUNGS:  Clear to auscultation bilaterally.  No wheezes or rhonchi. HEART:  Regular rate and rhythm. No murmur appreciated. ABDOMEN:  Soft, nontender.  Positive, normoactive bowel sounds. No organomegaly palpated. MSK:  No focal spinal tenderness to palpation. Full range of motion bilaterally in the upper extremities. EXTREMITIES:  No peripheral edema.   SKIN:  Clear with no obvious rashes or skin changes. No nail dyscrasia. NEURO:  Nonfocal. Well oriented.  Appropriate affect.  LABORATORY DATA:  None for this visit.  DIAGNOSTIC IMAGING:  None for this visit.      ASSESSMENT AND PLAN:  Ms.. Poole is a pleasant 67 y.o.  female with Stage 0 left breast DCIS (recurrent), ER+/PR+, diagnosed in 12/2021, treated with lumpectomy and anti-estrogen therapy with Anastrozole beginning in  04/2022.  She presents to the Survivorship Clinic for our initial meeting and routine follow-up post-completion of treatment for breast cancer.    1. Stage 0 right breast cancer:  Julia Poole is continuing to recover from definitive treatment for breast cancer. She will follow-up with her medical oncologist, Dr. Pamelia Hoit in 1 year with history and physical exam per surveillance protocol.  She will continue her anti-estrogen therapy with Anastrozole. Thus far, she is tolerating the Anastrozole well, with minimal side effects. She was instructed to make Dr. Pamelia Hoit or myself aware if she begins to experience any worsening side effects of the medication and I could see her back in clinic to help manage those side effects, as needed. Her mammogram is due 12/2022 and breast MRI due to ATM mutation in 07/2023.     Today, a comprehensive survivorship care plan and treatment summary was reviewed with the patient today detailing her breast cancer diagnosis, treatment course, potential late/long-term effects of treatment, appropriate follow-up care with recommendations for the future, and patient education resources.  A copy of this summary, along with a letter will be sent to the patient's primary care provider via mail/fax/In Basket message after today's visit.    2. Bone health:  Given Julia Poole's age/history of breast cancer and her current treatment regimen including anti-estrogen therapy with Anastrozole, she is at risk for bone demineralization.  Her last DEXA scan was 05/2021 and was normal.  I recommended repeat in 05/2023 and sent a message to her PCP to see if they will continue to obtain these at [facility].    She was given education on specific activities to promote bone health.  3. Cancer screening:  Due to Julia Poole's history and her age, she should receive  screening for skin cancers, colon cancer.  The information and recommendations are listed on the patient's comprehensive care plan/treatment summary and were reviewed in detail with the patient.    4. Health maintenance and wellness promotion: Julia Poole was encouraged to consume 5-7 servings of fruits and vegetables per day. We reviewed the "Nutrition Rainbow" handout. She was also encouraged to engage in moderate to vigorous exercise for 30 minutes per day most days of the week.  She was instructed to limit her alcohol consumption and continue to abstain from tobacco use.     5. Support services/counseling: It is not uncommon for this period of the patient's cancer care trajectory to be one of many emotions and stressors.  She was given information regarding our available services and encouraged to contact me with any questions or for help enrolling in any of our support group/programs.    Follow up instructions:    -Return to cancer center in 1 year for f/u with Dr. Pamelia Hoit  -Mammogram due in 12/2022 -Breast MRI 07/2023 -DEXA 05/2023 -Follow up with surgery 6 months -She is welcome to return back to the Survivorship Clinic at any time; no additional follow-up needed at this time.  -Consider referral back to survivorship as a long-term survivor for continued surveillance  The patient was provided an opportunity to ask questions and all were answered. The patient agreed with the plan and demonstrated an understanding of the instructions.   Total encounter time:40 minutes*in face-to-face visit time, chart review, lab review, care coordination, order entry, and documentation of the encounter time.    Lillard Anes, NP 07/12/22 11:32 AM Medical Oncology and Hematology [facility] [address] [place], [place] [address] Tel. [phone]    Fax.  [phone]  *Total Encounter Time as defined by the Centers for Medicare and Medicaid Services includes, in addition to the  face-to-face time of a patient visit (documented in the note above) non-face-to-face time: obtaining and reviewing outside history, ordering and reviewing medications, tests or procedures, care coordination (communications with other health care professionals or caregivers) and documentation in the medical record.

## 2022-07-15 ENCOUNTER — Telehealth: Payer: Self-pay | Admitting: Adult Health

## 2022-07-15 NOTE — Telephone Encounter (Signed)
Scheduled appointment per 4/12 los. Patient is aware of the made appointment. 

## 2022-07-26 ENCOUNTER — Ambulatory Visit: Payer: Medicare Other | Admitting: Nurse Practitioner

## 2022-07-30 ENCOUNTER — Ambulatory Visit (INDEPENDENT_AMBULATORY_CARE_PROVIDER_SITE_OTHER): Payer: Medicare Other | Admitting: Nurse Practitioner

## 2022-07-30 ENCOUNTER — Encounter: Payer: Self-pay | Admitting: Nurse Practitioner

## 2022-07-30 VITALS — BP 120/79 | HR 73 | Temp 98.2°F | Resp 20 | Ht 65.0 in | Wt 187.0 lb

## 2022-07-30 DIAGNOSIS — D420 Neoplasm of uncertain behavior of cerebral meninges: Secondary | ICD-10-CM | POA: Diagnosis not present

## 2022-07-30 DIAGNOSIS — E782 Mixed hyperlipidemia: Secondary | ICD-10-CM

## 2022-07-30 DIAGNOSIS — Z79891 Long term (current) use of opiate analgesic: Secondary | ICD-10-CM | POA: Diagnosis not present

## 2022-07-30 DIAGNOSIS — E034 Atrophy of thyroid (acquired): Secondary | ICD-10-CM | POA: Diagnosis not present

## 2022-07-30 DIAGNOSIS — M4316 Spondylolisthesis, lumbar region: Secondary | ICD-10-CM

## 2022-07-30 DIAGNOSIS — Z6831 Body mass index (BMI) 31.0-31.9, adult: Secondary | ICD-10-CM

## 2022-07-30 MED ORDER — ROSUVASTATIN CALCIUM 10 MG PO TABS: 10.0000 mg | ORAL_TABLET | Freq: Every day | ORAL | 1 refills | Status: DC

## 2022-07-30 MED ORDER — ACETAMINOPHEN-CODEINE 300-30 MG PO TABS: 1.0000 | ORAL_TABLET | ORAL | 2 refills | Status: DC | PRN

## 2022-07-30 MED ORDER — LEVOTHYROXINE SODIUM 50 MCG PO TABS: 50.0000 ug | ORAL_TABLET | Freq: Every day | ORAL | 1 refills | Status: DC

## 2022-07-30 NOTE — Patient Instructions (Signed)
Spondylolisthesis Rehab Ask your health care provider which exercises are safe for you. Do exercises exactly as told by your health care provider and adjust them as directed. It is normal to feel mild stretching, pulling, tightness, or discomfort as you do these exercises. Stop right away if you feel sudden pain or your pain gets worse. Do not begin these exercises until told by your health care provider. Stretching and range-of-motion exercises These exercises warm up your muscles and joints and improve the movement and flexibility of your hips and back. These exercises may also help to relieve pain, numbness, and tingling. Single knee to chest  Lie on your back on a firm surface with both legs straight. Bend one of your knees. Use your hands to move your knee up toward your chest until you feel a gentle stretch in your lower back and buttock. Hold your leg in this position by holding on to the front of your knee. Keep your other leg as straight as possible. Hold for __________ seconds. Slowly return to the starting position. Repeat this exercise with your other leg. Repeat __________ times. Complete this exercise __________ times a day. Double knee to chest  Lie on your back on a firm surface with both legs straight. Bend one of your knees and move it toward your chest until you feel a gentle stretch in your lower back and buttock. Tense your abdominal muscles and repeat the previous step with your other leg. Hold both of your legs in this position by holding on to the backs of your thighs or the fronts of your knees. Hold for __________ seconds. Tense your abdominal muscles and slowly move your legs back to the floor, one leg at a time. Repeat __________ times. Complete this exercise __________ times a day. Strengthening exercises These exercises build strength and endurance in your back. Endurance is the ability to use your muscles for a long time, even after they get tired. Pelvic  tilt This exercise strengthens the muscles that lie deep in the abdomen (deep abdominals). Lie on your back on a firm surface. Bend your knees and keep your feet flat. Tense your abdominal muscles. Tip your pelvis up toward the ceiling and flatten your lower back into the floor. To help with this exercise, you may place a small towel under your lower back and try to push your back into the towel. Hold for __________ seconds. Let your muscles relax completely before you repeat this exercise. Repeat __________ times. Complete this exercise __________ times a day. Abdominal crunch  Lie on your back on a firm surface. Bend your knees and keep your feet flat. Cross your arms over your chest. Tuck your chin down toward your chest, without bending your neck. Use your abdominal muscles to lift your upper body off the ground, straight up into the air. Try to lift yourself until your shoulder blades are off the ground. You may need to work up to this. Keep your lower back on the ground while you crunch upward. Do not hold your breath. Slowly lower yourself down. Keep your abdominal muscles tense until you are back to the starting position. Repeat __________ times. Complete this exercise __________ times a day. Alternating arm and leg raises  Get on your hands and knees on a firm surface. If you are on a hard floor, you may want to use padding, such as an exercise mat, to cushion your knees. Line up your arms and legs. Your hands should be directly below your  shoulders, and your knees should be directly below your hips. Lift your left leg behind you. At the same time, raise your right arm and straighten it in front of you. Do not lift your leg higher than your hip. Do not lift your arm higher than your shoulder. Keep your abdominal and back muscles tight. Keep your hips facing the ground. Do not arch your back. Keep your balance carefully, and do not hold your breath. Hold for __________  seconds. Slowly return to the starting position. Repeat with your right leg and your left arm. Repeat __________ times. Complete this exercise __________ times a day. Posture and body mechanics Good posture and healthy body mechanics can help to relieve stress in your body's tissues and joints. Body mechanics refers to the movements and positions of your body while you do your daily activities. Posture is part of body mechanics. Good posture means: Your spine is in its natural S-curve position (neutral). Your shoulders are pulled back slightly. Your head is not tipped forward. Follow these guidelines to improve your posture and body mechanics in your everyday activities. Standing  When standing, keep your spine neutral and your feet about hip width apart. Keep a slight bend in your knees. Your ears, shoulders, and hips should line up. When you do a task in which you stand in one place for a long time, place one foot up on a stable object that is 2-4 inches (5-10 cm) high, such as a footstool. This helps keep your spine neutral. Sitting  When sitting, keep your spine neutral and keep your feet flat on the floor. Use a footrest, if necessary, and keep your thighs parallel to the floor. Avoid rounding your shoulders, and avoid tilting your head forward. When working at a desk or a computer, keep your desk at a height where your hands are slightly lower than your elbows. Slide your chair under your desk so you are close enough to maintain good posture. When working at a computer, place your monitor at a height where you are looking straight ahead and you do not have to tilt your head forward or downward to look at the screen. Resting  When lying down and resting, avoid positions that are most painful for you. If you have pain with activities such as sitting, bending, stooping, or squatting (flexion-basedactivities), lie in a position in which your body does not bend very much. For example, avoid  curling up on your side with your arms and knees near your chest (fetal position). If you have pain with activities such as standing for a long time or reaching with your arms (extension-basedactivities), lie with your spine in a neutral position and bend your knees slightly. Try the following positions: Lying on your side with a pillow between your knees. Lying on your back with a pillow under your knees. Lifting  When lifting objects, keep your feet at least shoulder width apart and tighten your abdominal muscles. Bend your knees and hips and keep your spine neutral. It is important to lift using the strength of your legs, not your back. Do not lock your knees straight out. Always ask for help to lift heavy or awkward objects. This information is not intended to replace advice given to you by your health care provider. Make sure you discuss any questions you have with your health care provider. Document Revised: 06/27/2021 Document Reviewed: 06/27/2021 Elsevier Patient Education  2023 [employer].

## 2022-07-30 NOTE — Progress Notes (Addendum)
Subjective:    Patient ID: Julia Poole, female    DOB: [birth year], 67 y.o.   MRN: [id number]   Chief Complaint: medical management of chronic issues     HPI:  Julia Poole is a 67 y.o. who identifies as a female who was assigned female at birth.   Social history: Lives with: husband Work history: retired   Water engineer in today for follow up of the following chronic medical issues:  1. Hypothyroidism due to acquired atrophy of thyroid No issues that she is aware of. Lab Results  Component Value Date   TSH 1.970 01/24/2022     2. Mixed hyperlipidemia Does try to watch diet but does no dedicated exercise. Lab Results  Component Value Date   CHOL 160 01/24/2022   HDL 51 01/24/2022   LDLCALC 87 01/24/2022   TRIG 124 01/24/2022   CHOLHDL 3.1 01/24/2022   The 10-year ASCVD risk score (Arnett DK, et al., 2019) is: 7.2%   3. Atypical meningioma of brain (HCC) No recent issues since having surgery several years ago. Denies dizziness. Headache or blurred vision  4. Spondylthesis Pain assessment: Cause of pain- spondylthesis Pain location- lower back Pain on scale of 1-10- 1-2/10 Frequency- daily What increases pain-to much activity What makes pain Better-rest helps Effects on ADL - none Any change in general medical condition-none  Current opioids rx- tylenol #3 # meds rx- 30 Effectiveness of current meds-helps Adverse reactions from pain meds-none Morphine equivalent-  Pill count performed-No Last drug screen - today ( high risk q36m, moderate risk q68m, low risk yearly ) Urine drug screen today- Yes Was the [facility] reviewed- yes  If yes were their any concerning findings? - no   Overdose risk: 1    10/14/2018   10:25 AM  Opioid Risk   Alcohol 0  Illegal Drugs 0  Rx Drugs 0  Alcohol 0  Illegal Drugs 0  Rx Drugs 0  Age between 16-45 years  0  History of Preadolescent Sexual Abuse 0  Psychological Disease 0  Depression 0  Opioid Risk Tool  Scoring 0  Opioid Risk Interpretation Low Risk     5. BMI 31.0-31.9,adult No recent weight changes Wt Readings from Last 3 Encounters:  07/30/22 187 lb (84.8 kg)  07/12/22 188 lb 3.2 oz (85.4 kg)  05/01/22 182 lb (82.6 kg)   BMI Readings from Last 3 Encounters:  07/30/22 31.12 kg/m  07/12/22 31.32 kg/m  05/01/22 30.29 kg/m      New complaints: None today  Allergies  Allergen Reactions   Norco [Hydrocodone-Acetaminophen] Nausea Only   Outpatient Encounter Medications as of 07/30/2022  Medication Sig   acetaminophen-codeine (TYLENOL #3) 300-30 MG tablet Take 1 tablet by mouth every 4 (four) hours as needed for moderate pain.   anastrozole (ARIMIDEX) 1 MG tablet Take 1 tablet (1 mg total) by mouth daily.   Ascorbic Acid (VITAMIN C) 500 MG tablet Take 500 mg by mouth daily.    aspirin 81 MG tablet Take 81 mg by mouth daily.     Biotin 10 MG CAPS Take by mouth.   Boswellia-Glucosamine-Vit D (OSTEO BI-FLEX ONE PER DAY PO) Take by mouth.   Cholecalciferol (VITAMIN D) 2000 UNITS tablet Take 2,000 Units by mouth 2 (two) times daily.   COCONUT OIL PO Take by mouth.   Cyanocobalamin (VITAMIN B 12 PO) Take by mouth.   FLAXSEED, LINSEED, PO Take by mouth.   Glucosamine-Chondroitin (OSTEO BI-FLEX REGULAR STRENGTH PO) Take by mouth.  levothyroxine (SYNTHROID) 50 MCG tablet Take 1 tablet (50 mcg total) by mouth daily.   Multiple Vitamins-Minerals (CENTRUM SILVER PO) Take by mouth.     pseudoephedrine (SUDAFED) 30 MG tablet Take 30 mg by mouth every 4 (four) hours as needed for congestion.   psyllium (REGULOID) 0.52 g capsule Take 0.52 g by mouth daily.   rosuvastatin (CRESTOR) 10 MG tablet Take 1 tablet (10 mg total) by mouth daily.   TURMERIC PO Take by mouth.   vitamin E 180 MG (400 UNITS) capsule Take 400 Units by mouth daily.   Facility-Administered Encounter Medications as of 07/30/2022  Medication   0.9 %  sodium chloride infusion    Past Surgical History:  Procedure  Laterality Date   BRAIN SURGERY     BREAST LUMPECTOMY WITH RADIOACTIVE SEED LOCALIZATION Right 02/26/2022   Procedure: RIGHT BREAST LUMPECTOMY WITH RADIOACTIVE SEED LOCALIZATION;  Surgeon: Emelia Loron, MD;  Location:  [facility];  Service: General;  Laterality: Right;   BREAST SURGERY  AUG. 2010   right breast lumpectomy   BREAST SURGERY     Cancer   COLONOSCOPY     CRANIOTOMY Left 10/16/2015   Procedure: PARA-SAGITTAL CRANIOTOMY WITH STEREOTACTIC NAVIGATION FOR TUMOR;  Surgeon: Loura Halt Ditty, MD;  Location: [facility];  Service: Neurosurgery;  Laterality: Left;   GANGLION CYST EXCISION  SEP. 1997   RIGHT WRIST    Family History  Problem Relation Age of Onset   Colon polyps Mother    Colon cancer Mother 19   Diabetes Father    Heart disease Father    Cancer Father        skin   Colon polyps Sister    Leukemia Sister 7       CLL   Breast cancer Brother 2   Cancer Brother        bone mets   Cancer Brother        skin   Ovarian cancer Maternal Aunt        dx in her 30s   Tuberculosis Maternal Grandmother    Heart disease Maternal Grandfather    Diabetes Paternal Grandmother    Alcohol abuse Paternal Grandfather    Esophageal cancer Neg Hx    Rectal cancer Neg Hx    Stomach cancer Neg Hx       Controlled substance contract: 07/30/22- drug screen today     Review of Systems  Constitutional:  Negative for diaphoresis.  Eyes:  Negative for pain.  Respiratory:  Negative for shortness of breath.   Cardiovascular:  Negative for chest pain, palpitations and leg swelling.  Gastrointestinal:  Negative for abdominal pain.  Endocrine: Negative for polydipsia.  Skin:  Negative for rash.  Neurological:  Negative for dizziness, weakness and headaches.  Hematological:  Does not bruise/bleed easily.  All other systems reviewed and are negative.      Objective:   Physical Exam Vitals and nursing note reviewed.  Constitutional:      General:  She is not in acute distress.    Appearance: Normal appearance. She is well-developed.  HENT:     Head: Normocephalic.     Right Ear: Tympanic membrane normal.     Left Ear: Tympanic membrane normal.     Nose: Nose normal.     Mouth/Throat:     Mouth: Mucous membranes are moist.  Eyes:     Pupils: Pupils are equal, round, and reactive to light.  Neck:     Vascular: No  carotid bruit or JVD.  Cardiovascular:     Rate and Rhythm: Normal rate and regular rhythm.     Heart sounds: Normal heart sounds. Murmur: 2/6.  Pulmonary:     Effort: Pulmonary effort is normal. No respiratory distress.     Breath sounds: Normal breath sounds. No wheezing or rales.  Chest:     Chest Varnum: No tenderness.  Abdominal:     General: Bowel sounds are normal. There is no distension or abdominal bruit.     Palpations: Abdomen is soft. There is no hepatomegaly, splenomegaly, mass or pulsatile mass.     Tenderness: There is no abdominal tenderness.  Musculoskeletal:        General: Normal range of motion.     Cervical back: Normal range of motion and neck supple.  Lymphadenopathy:     Cervical: No cervical adenopathy.  Skin:    General: Skin is warm and dry.  Neurological:     Mental Status: She is alert and oriented to person, place, and time.     Deep Tendon Reflexes: Reflexes are normal and symmetric.  Psychiatric:        Behavior: Behavior normal.        Thought Content: Thought content normal.        Judgment: Judgment normal.     BP 120/79   Pulse 73   Temp 98.2 F (36.8 C) (Temporal)   Resp 20   Ht 5\' 5"  (1.651 m)   Wt 187 lb (84.8 kg)   SpO2 98%   BMI 31.12 kg/m        Assessment & Plan:   Julia Poole comes in today with chief complaint of Medical Management of Chronic Issues (Fell 2 weeks ago and hit head on cement)   Diagnosis and orders addressed:  1. Hypothyroidism due to acquired atrophy of thyroid Labs pending - levothyroxine (SYNTHROID) 50 MCG tablet; Take 1  tablet (50 mcg total) by mouth daily.  Dispense: 90 tablet; Refill: 1  2. Mixed hyperlipidemia Low fat diet - rosuvastatin (CRESTOR) 10 MG tablet; Take 1 tablet (10 mg total) by mouth daily.  Dispense: 90 tablet; Refill: 1  3. Atypical meningioma of brain [facility]) Report any issues  4. BMI 31.0-31.9,adult Discussed diet and exercise for person with BMI >25 Will recheck weight in 3-6 months   5. Spondylolisthesis of lumbar region Moist heat - ToxASSURE Select 13 (MW), Urine   Labs pending Health Maintenance reviewed Diet and exercise encouraged  Follow up plan: 3 pain management   Mary-Margaret Daphine Deutscher, FNP

## 2022-07-30 NOTE — Addendum Note (Signed)
Addended by: Bennie Pierini on: 07/30/2022 10:00 AM   Modules accepted: Orders

## 2022-07-31 LAB — CBC WITH DIFFERENTIAL/PLATELET
Basophils Absolute: 0 10*3/uL (ref 0.0–0.2)
Basos: 0 %
EOS (ABSOLUTE): 0.2 10*3/uL (ref 0.0–0.4)
Eos: 4 %
Hematocrit: 40.8 % (ref 34.0–46.6)
Hemoglobin: 13.6 g/dL (ref 11.1–15.9)
Immature Grans (Abs): 0 10*3/uL (ref 0.0–0.1)
Immature Granulocytes: 0 %
Lymphocytes Absolute: 2.4 10*3/uL (ref 0.7–3.1)
Lymphs: 40 %
MCH: 30.5 pg (ref 26.6–33.0)
MCHC: 33.3 g/dL (ref 31.5–35.7)
MCV: 92 fL (ref 79–97)
Monocytes Absolute: 0.4 10*3/uL (ref 0.1–0.9)
Monocytes: 7 %
Neutrophils Absolute: 2.9 10*3/uL (ref 1.4–7.0)
Neutrophils: 49 %
Platelets: 276 10*3/uL (ref 150–450)
RBC: 4.46 x10E6/uL (ref 3.77–5.28)
RDW: 12.4 % (ref 11.7–15.4)
WBC: 6 10*3/uL (ref 3.4–10.8)

## 2022-07-31 LAB — CMP14+EGFR
ALT: 20 IU/L (ref 0–32)
AST: 22 IU/L (ref 0–40)
Albumin/Globulin Ratio: 2 (ref 1.2–2.2)
Albumin: 4.7 g/dL (ref 3.9–4.9)
Alkaline Phosphatase: 99 IU/L (ref 44–121)
BUN/Creatinine Ratio: 20 (ref 12–28)
BUN: 14 mg/dL (ref 8–27)
Bilirubin Total: 1 mg/dL (ref 0.0–1.2)
CO2: 24 mmol/L (ref 20–29)
Calcium: 10.4 mg/dL — ABNORMAL HIGH (ref 8.7–10.3)
Chloride: 102 mmol/L (ref 96–106)
Creatinine, Ser: 0.69 mg/dL (ref 0.57–1.00)
Globulin, Total: 2.3 g/dL (ref 1.5–4.5)
Glucose: 97 mg/dL (ref 70–99)
Potassium: 4 mmol/L (ref 3.5–5.2)
Sodium: 141 mmol/L (ref 134–144)
Total Protein: 7 g/dL (ref 6.0–8.5)
eGFR: 94 mL/min/{1.73_m2} (ref >59)

## 2022-07-31 LAB — LIPID PANEL
Chol/HDL Ratio: 2.9 ratio (ref 0.0–4.4)
Cholesterol, Total: 163 mg/dL (ref 100–199)
HDL: 56 mg/dL (ref >39)
LDL Chol Calc (NIH): 89 mg/dL (ref 0–99)
Triglycerides: 99 mg/dL (ref 0–149)
VLDL Cholesterol Cal: 18 mg/dL (ref 5–40)

## 2022-07-31 LAB — THYROID PANEL WITH TSH
Free Thyroxine Index: 1.7 (ref 1.2–4.9)
T3 Uptake Ratio: 23 % — ABNORMAL LOW (ref 24–39)
T4, Total: 7.6 ug/dL (ref 4.5–12.0)
TSH: 2.57 u[IU]/mL (ref 0.450–4.500)

## 2022-08-01 LAB — TOXASSURE SELECT 13 (MW), URINE

## 2022-08-20 ENCOUNTER — Ambulatory Visit
Admission: RE | Admit: 2022-08-20 | Discharge: 2022-08-20 | Disposition: A | Discharge status: Home / Self Care | Payer: Medicare Other | Source: Ambulatory Visit | Attending: Adult Health | Admitting: Adult Health

## 2022-08-20 ENCOUNTER — Telehealth: Payer: Self-pay

## 2022-08-20 DIAGNOSIS — D0511 Intraductal carcinoma in situ of right breast: Secondary | ICD-10-CM

## 2022-08-20 DIAGNOSIS — Z1509 Genetic susceptibility to other malignant neoplasm: Secondary | ICD-10-CM

## 2022-08-20 MED ORDER — GADOPICLENOL 0.5 MMOL/ML IV SOLN
9.0000 mL | Freq: Once | INTRAVENOUS | Status: AC | PRN
  Administered 2022-08-20: 9 mL via INTRAVENOUS

## 2022-08-20 NOTE — Telephone Encounter (Signed)
Called and LVM regarding after hours message. Pt asking why she is scheduled for MRI today. Advised Pt that for patients that are considered "high risk" (due to her ATM mutation and recurrence) we like to alternate breast MRI and mammograms every 6 months. Gave call back number with any questions.

## 2022-08-22 ENCOUNTER — Telehealth: Payer: Self-pay | Admitting: *Deleted

## 2022-08-22 NOTE — Telephone Encounter (Signed)
Per Lindsey Causey, NP, called pt with message below. Pt verbalized understanding.  

## 2022-08-22 NOTE — Telephone Encounter (Signed)
-----   Message from Loa Socks, NP sent at 08/21/2022  4:10 PM EDT ----- Regarding: FW: MRI negative for cancer.  Good news. Please let patient know ----- Message ----- From: Interface, Rad Results In Sent: 08/21/2022   3:42 PM EDT To: Loa Socks, NP

## 2022-10-25 ENCOUNTER — Encounter: Payer: Self-pay | Admitting: Nurse Practitioner

## 2022-10-25 ENCOUNTER — Ambulatory Visit: Payer: Medicare Other | Admitting: Nurse Practitioner

## 2022-10-25 VITALS — BP 114/73 | HR 73 | Temp 97.5°F | Resp 20 | Ht 65.0 in | Wt 186.0 lb

## 2022-10-25 DIAGNOSIS — M4316 Spondylolisthesis, lumbar region: Secondary | ICD-10-CM | POA: Diagnosis not present

## 2022-10-25 MED ORDER — ACETAMINOPHEN-CODEINE 300-30 MG PO TABS: 1.0000 | ORAL_TABLET | ORAL | 2 refills | Status: DC | PRN

## 2022-10-25 NOTE — Progress Notes (Signed)
Subjective:    Patient ID: Julia Poole, female    DOB: 05-24-1955, 67 y.o.   MRN: [id number]    Chief Complaint: Pain Management   HPI Pain assessment: Cause of pain- spondylolisesis Pain location- lower back Pain on scale of 1-10- 4/10 today Frequency- daily What increases pain-to much activity What makes pain Better-rest helps Effects on ADL - none Any change in general medical condition-no  Current opioids rx- tylenol #3 daily # meds rx- 30 Effectiveness of current meds-helps Adverse reactions from pain meds-none Morphine equivalent-  Pill count performed-No Last drug screen - 07/30/22 ( high risk q23m, moderate risk q52m, low risk yearly ) Urine drug screen today- No Was the NCCSR reviewed- yes  If yes were their any concerning findings? - no   Overdose risk: 1    10/14/2018   10:25 AM  Opioid Risk   Alcohol 0  Illegal Drugs 0  Rx Drugs 0  Alcohol 0  Illegal Drugs 0  Rx Drugs 0  Age between 16-45 years  0  History of Preadolescent Sexual Abuse 0  Psychological Disease 0  Depression 0  Opioid Risk Tool Scoring 0  Opioid Risk Interpretation Low Risk     Pain contract signed on:  Patient Active Problem List   Diagnosis Date Noted   Ductal carcinoma in situ (DCIS) of right breast 04/04/2022   Genetic testing 01/18/2016   Family history of breast cancer in female    Family history of colon cancer    Family history of ovarian cancer    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  Eyes:  Negative for pain.  Respiratory:  Negative for shortness of breath.   Cardiovascular:  Negative for chest pain, palpitations and leg swelling.  Gastrointestinal:  Negative for abdominal pain.  Endocrine: Negative for  polydipsia.  Skin:  Negative for rash.  Neurological:  Negative for dizziness, weakness and headaches.  Hematological:  Does not bruise/bleed easily.  All other systems reviewed and are negative.      Objective:   Physical Exam Constitutional:      Appearance: Normal appearance.  Cardiovascular:     Rate and Rhythm: Normal rate and regular rhythm.     Heart sounds: Normal heart sounds.  Pulmonary:     Breath sounds: Normal breath sounds.  Skin:    General: Skin is warm.  Neurological:     General: No focal deficit present.     Mental Status: She is alert and oriented to person, place, and time.  Psychiatric:        Mood and Affect: Mood normal.        Behavior: Behavior normal.    BP 114/73   Pulse 73   Temp (!) 97.5 F (36.4 C) (Temporal)   Resp 20   Ht 5\' 5"  (1.651 m)   Wt 186 lb (84.4 kg)   SpO2 99%   BMI 30.95 kg/m         Assessment & Plan:   Julia Poole comes in today with chief complaint of Pain Management   Diagnosis and orders addressed:  1. Spondylolisthesis of lumbar region Moist heat rest - acetaminophen-codeine (TYLENOL #3) 300-30 MG tablet; Take 1 tablet by mouth every 4 (four) hours as needed  for moderate pain.  Dispense: 30 tablet; Refill: 2   Follow up plan: 3 months   Mary-Margaret Daphine Deutscher, FNP

## 2022-10-25 NOTE — Addendum Note (Signed)
Addended by: Bennie Pierini on: 10/25/2022 04:03 PM   Modules accepted: Level of Service

## 2022-10-25 NOTE — Patient Instructions (Signed)
Chronic Back Pain Chronic back pain is back pain that lasts longer than 3 months. The pain may get worse at certain times (flare-ups). There are things you can do at home to manage your pain. Follow these instructions at home: Watch for any changes in your symptoms. Take these actions to help with your pain: Managing pain and stiffness     If told, put ice on the painful area. You may be told to use ice for 24-48 hours after a flare-up starts. Put ice in a plastic bag. Place a towel between your skin and the bag. Leave the ice on for 20 minutes, 2-3 times a day. If told, put heat on the painful area. Do this as often as told by your doctor. Use the heat source that your doctor recommends, such as a moist heat pack or a heating pad. Place a towel between your skin and the heat source. Leave the heat on for 20-30 minutes. If your skin turns bright red, take off the ice or heat right away to prevent skin damage. The risk of damage is higher if you cannot feel pain, heat, or cold. Soak in a warm bath. This can help with pain. Activity        Avoid bending and other activities that make the pain worse. When you stand: Keep your upper back and neck straight. Keep your shoulders pulled back. Avoid slouching. When you sit: Keep your back straight. Relax your shoulders. Do not round your shoulders or pull them backward. Do not sit or stand in one place for too long. Take short rest breaks during the day. Lying down or standing is often better than sitting. Resting can help relieve pain. When sitting or lying down for a long time, do some mild activity or stretching. This will help to prevent stiffness and pain. Get regular exercise. Ask your doctor what activities are safe for you. You may have to avoid lifting. Ask your provider how much you can safely lift. If you lift things: Bend your knees. Keep the weight close to your body. Avoid twisting. Medicines Take over-the-counter and  prescription medicines only as told by your doctor. You may need to take medicines for pain and swelling. These may be taken by mouth or put on the skin. You may also be given muscle relaxants. Ask your doctor if the medicine prescribed to you: Requires you to avoid driving or using machinery. Can cause trouble pooping (constipation). You may need to take these actions to prevent or treat trouble pooping: Drink enough fluid to keep your pee (urine) pale yellow. Take over-the-counter or prescription medicines. Eat foods that are high in fiber. These include beans, whole grains, and fresh fruits and vegetables. Limit foods that are high in fat and sugars. These include fried or sweet foods. General instructions  Sleep on a firm mattress. Try lying on your side with your knees slightly bent. If you lie on your back, put a pillow under your knees. Do not smoke or use any products that contain nicotine or tobacco. If you need help quitting, ask your doctor. Contact a doctor if: Your pain does not get better with rest or medicine. You have new pain. You have a fever. You lose weight quickly. You have trouble doing your normal activities. One or both of your legs or feet feel weak. One or both of your legs or feet lose feeling (have numbness). Get help right away if: You are not able to control when you pee   or poop. You have bad back pain and: You feel like you may vomit (nauseous). You vomit. You have pain in your chest or your belly (abdomen). You have shortness of breath. You faint. These symptoms may be an emergency. Get help right away. Call 911. Do not wait to see if the symptoms will go away. Do not drive yourself to the hospital. This information is not intended to replace advice given to you by your health care provider. Make sure you discuss any questions you have with your health care provider. Document Revised: 11/05/2021 Document Reviewed: 11/05/2021 Elsevier Patient Education   2024 [employer]  

## 2023-01-28 ENCOUNTER — Ambulatory Visit (INDEPENDENT_AMBULATORY_CARE_PROVIDER_SITE_OTHER): Payer: Medicare Other | Admitting: Nurse Practitioner

## 2023-01-28 ENCOUNTER — Encounter: Payer: Self-pay | Admitting: Nurse Practitioner

## 2023-01-28 VITALS — BP 126/75 | HR 72 | Temp 97.1°F | Resp 20 | Ht 65.0 in | Wt 194.0 lb

## 2023-01-28 DIAGNOSIS — M4316 Spondylolisthesis, lumbar region: Secondary | ICD-10-CM | POA: Diagnosis not present

## 2023-01-28 DIAGNOSIS — D420 Neoplasm of uncertain behavior of cerebral meninges: Secondary | ICD-10-CM

## 2023-01-28 DIAGNOSIS — Z23 Encounter for immunization: Secondary | ICD-10-CM

## 2023-01-28 DIAGNOSIS — Z6831 Body mass index (BMI) 31.0-31.9, adult: Secondary | ICD-10-CM

## 2023-01-28 DIAGNOSIS — E782 Mixed hyperlipidemia: Secondary | ICD-10-CM | POA: Diagnosis not present

## 2023-01-28 DIAGNOSIS — E034 Atrophy of thyroid (acquired): Secondary | ICD-10-CM

## 2023-01-28 MED ORDER — LEVOTHYROXINE SODIUM 50 MCG PO TABS: 50.0000 ug | ORAL_TABLET | Freq: Every day | ORAL | 1 refills | Status: DC

## 2023-01-28 MED ORDER — ACETAMINOPHEN-CODEINE 300-30 MG PO TABS: 1.0000 | ORAL_TABLET | Freq: Two times a day (BID) | ORAL | 2 refills | Status: DC | PRN

## 2023-01-28 MED ORDER — ROSUVASTATIN CALCIUM 10 MG PO TABS: 10.0000 mg | ORAL_TABLET | Freq: Every day | ORAL | 1 refills | Status: DC

## 2023-01-28 NOTE — Progress Notes (Signed)
Subjective:    Patient ID: Julia Poole, female    DOB: 06/28/1955, 67 y.o.   MRN: [id number]   Chief Complaint: Medical Management of Chronic Issues    HPI:  Julia Poole is a 67 y.o. who identifies as a female who was assigned female at birth.   Social history: Lives with: husband Work history: retired   Water engineer in today for follow up of the following chronic medical issues:  1. Mixed hyperlipidemia Does not watch diet very closely and does no exercise. Lab Results  Component Value Date   CHOL 163 07/30/2022   HDL 56 07/30/2022   LDLCALC 89 07/30/2022   TRIG 99 07/30/2022   CHOLHDL 2.9 07/30/2022     2. Hypothyroidism due to acquired atrophy of thyroid No issues that she is aware of Lab Results  Component Value Date   TSH 2.570 07/30/2022     3. Atypical meningioma of brain (HCC) No recent occurrence. Denies headaches, dizziness of blurred vision.  4. Spondylolisthesis Pain assessment: Cause of pain- spondylolisthesis Pain location- low back pain Pain on scale of 1-10- 4-5/10 Frequency- daily What increases pain-to much activity What makes pain Better-rest helps Effects on ADL - none Any change in general medical condition-none  Current opioids rx- tylenol #3 bid prn # meds rx- 60 Effectiveness of current meds-helsp Adverse reactions from pain meds-none Morphine equivalent- 27 MME  Pill count performed-Yes Last drug screen - 07/30/22 ( high risk q19m, moderate risk q16m, low risk yearly ) Urine drug screen today- No Was the [facility] reviewed- yes  If yes were their any concerning findings? - no   Overdose risk: 1    10/14/2018   10:25 AM  Opioid Risk   Alcohol 0  Illegal Drugs 0  Rx Drugs 0  Alcohol 0  Illegal Drugs 0  Rx Drugs 0  Age between 16-45 years  0  History of Preadolescent Sexual Abuse 0  Psychological Disease 0  Depression 0  Opioid Risk Tool Scoring 0  Opioid Risk Interpretation Low Risk     Pain contract signed  on:08/08/22    5. BMI 31.0-31.9,adult Weight is up 8lbs Wt Readings from Last 3 Encounters:  01/28/23 194 lb (88 kg)  10/25/22 186 lb (84.4 kg)  07/30/22 187 lb (84.8 kg)   BMI Readings from Last 3 Encounters:  01/28/23 32.28 kg/m  10/25/22 30.95 kg/m  07/30/22 31.12 kg/m      New complaints: None today  Allergies  Allergen Reactions   Norco [Hydrocodone-Acetaminophen] Nausea Only   Outpatient Encounter Medications as of 01/28/2023  Medication Sig   acetaminophen-codeine (TYLENOL #3) 300-30 MG tablet Take 1 tablet by mouth every 4 (four) hours as needed for moderate pain.   anastrozole (ARIMIDEX) 1 MG tablet Take 1 tablet (1 mg total) by mouth daily.   Ascorbic Acid (VITAMIN C) 500 MG tablet Take 500 mg by mouth daily.    aspirin 81 MG tablet Take 81 mg by mouth daily.     Biotin 10 MG CAPS Take by mouth.   Boswellia-Glucosamine-Vit D (OSTEO BI-FLEX ONE PER DAY PO) Take by mouth.   Cholecalciferol (VITAMIN D) 2000 UNITS tablet Take 2,000 Units by mouth 2 (two) times daily.   Cyanocobalamin (VITAMIN B 12 PO) Take by mouth.   FLAXSEED, LINSEED, PO Take by mouth.   Glucosamine-Chondroitin (OSTEO BI-FLEX REGULAR STRENGTH PO) Take by mouth.   levothyroxine (SYNTHROID) 50 MCG tablet Take 1 tablet (50 mcg total) by mouth daily.  Multiple Vitamins-Minerals (CENTRUM SILVER PO) Take by mouth.     pseudoephedrine (SUDAFED) 30 MG tablet Take 30 mg by mouth every 4 (four) hours as needed for congestion.   psyllium (REGULOID) 0.52 g capsule Take 0.52 g by mouth daily.   rosuvastatin (CRESTOR) 10 MG tablet Take 1 tablet (10 mg total) by mouth daily.   TURMERIC PO Take by mouth.   vitamin E 180 MG (400 UNITS) capsule Take 400 Units by mouth daily.   Facility-Administered Encounter Medications as of 01/28/2023  Medication   0.9 %  sodium chloride infusion    Past Surgical History:  Procedure Laterality Date   BRAIN SURGERY     BREAST LUMPECTOMY WITH RADIOACTIVE SEED  LOCALIZATION Right 02/26/2022   Procedure: RIGHT BREAST LUMPECTOMY WITH RADIOACTIVE SEED LOCALIZATION;  Surgeon: Emelia Loron, MD;  Location: [facility];  Service: General;  Laterality: Right;   BREAST SURGERY  AUG. 2010   right breast lumpectomy   BREAST SURGERY     Cancer   COLONOSCOPY     CRANIOTOMY Left 10/16/2015   Procedure: PARA-SAGITTAL CRANIOTOMY WITH STEREOTACTIC NAVIGATION FOR TUMOR;  Surgeon: Loura Halt Ditty, MD;  Location: [facility];  Service: Neurosurgery;  Laterality: Left;   GANGLION CYST EXCISION  SEP. 1997   RIGHT WRIST    Family History  Problem Relation Age of Onset   Colon polyps Mother    Colon cancer Mother 74   Diabetes Father    Heart disease Father    Cancer Father        skin   Colon polyps Sister    Leukemia Sister 68       CLL   Breast cancer Brother 21   Cancer Brother        bone mets   Cancer Brother        skin   Ovarian cancer Maternal Aunt        dx in her 30s   Tuberculosis Maternal Grandmother    Heart disease Maternal Grandfather    Diabetes Paternal Grandmother    Alcohol abuse Paternal Grandfather    Esophageal cancer Neg Hx    Rectal cancer Neg Hx    Stomach cancer Neg Hx       Controlled substance contract: n/a     Review of Systems  Constitutional:  Negative for diaphoresis.  Eyes:  Negative for pain.  Respiratory:  Negative for shortness of breath.   Cardiovascular:  Negative for chest pain, palpitations and leg swelling.  Gastrointestinal:  Negative for abdominal pain.  Endocrine: Negative for polydipsia.  Skin:  Negative for rash.  Neurological:  Negative for dizziness, weakness and headaches.  Hematological:  Does not bruise/bleed easily.  All other systems reviewed and are negative.      Objective:   Physical Exam Vitals and nursing note reviewed.  Constitutional:      General: She is not in acute distress.    Appearance: Normal appearance. She is well-developed.  HENT:      Head: Normocephalic.     Right Ear: Tympanic membrane normal.     Left Ear: Tympanic membrane normal.     Nose: Nose normal.     Mouth/Throat:     Mouth: Mucous membranes are moist.  Eyes:     Pupils: Pupils are equal, round, and reactive to light.  Neck:     Vascular: No carotid bruit or JVD.  Cardiovascular:     Rate and Rhythm: Normal rate and regular rhythm.  Heart sounds: Normal heart sounds.  Pulmonary:     Effort: Pulmonary effort is normal. No respiratory distress.     Breath sounds: Normal breath sounds. No wheezing or rales.  Chest:     Chest Kerlin: No tenderness.  Abdominal:     General: Bowel sounds are normal. There is no distension or abdominal bruit.     Palpations: Abdomen is soft. There is no hepatomegaly, splenomegaly, mass or pulsatile mass.     Tenderness: There is no abdominal tenderness.  Musculoskeletal:        General: Normal range of motion.     Cervical back: Normal range of motion and neck supple.  Lymphadenopathy:     Cervical: No cervical adenopathy.  Skin:    General: Skin is warm and dry.  Neurological:     Mental Status: She is alert and oriented to person, place, and time.     Deep Tendon Reflexes: Reflexes are normal and symmetric.  Psychiatric:        Behavior: Behavior normal.        Thought Content: Thought content normal.        Judgment: Judgment normal.    BP 126/75   Pulse 72   Temp (!) 97.1 F (36.2 C) (Temporal)   Resp 20   Ht 5\' 5"  (1.651 m)   Wt 194 lb (88 kg)   SpO2 98%   BMI 32.28 kg/m         Assessment & Plan:   Tove Kohlmeier Stumpe comes in today with chief complaint of Medical Management of Chronic Issues   Diagnosis and orders addressed:  1. Mixed hyperlipidemia Low fat diet - rosuvastatin (CRESTOR) 10 MG tablet; Take 1 tablet (10 mg total) by mouth daily.  Dispense: 90 tablet; Refill: 1 - CBC with Differential/Platelet - CMP14+EGFR - Lipid panel  2. Hypothyroidism due to acquired atrophy of  thyroid Labs pending - levothyroxine (SYNTHROID) 50 MCG tablet; Take 1 tablet (50 mcg total) by mouth daily.  Dispense: 90 tablet; Refill: 1 - Thyroid Panel With TSH  3. Atypical meningioma of brain (HCC) Report any dizziness headaches or blurred vision  4. BMI 31.0-31.9,adult Discussed diet and exercise for person with BMI >25 Will recheck weight in 3-6 months   5. Spondylolisthesis of lumbar region Moist heat No heavy lifting - acetaminophen-codeine (TYLENOL #3) 300-30 MG tablet; Take 1 tablet by mouth 2 (two) times daily as needed for moderate pain (pain score 4-6).  Dispense: 60 tablet; Refill: 2   Labs pending Health Maintenance reviewed Diet and exercise encouraged  Follow up plan: 3 months   Mary-Margaret Daphine Deutscher, FNP

## 2023-01-28 NOTE — Patient Instructions (Signed)
Chronic Back Pain Chronic back pain is back pain that lasts longer than 3 months. The pain may get worse at certain times (flare-ups). There are things you can do at home to manage your pain. Follow these instructions at home: Watch for any changes in your symptoms. Take these actions to help with your pain: Managing pain and stiffness     If told, put ice on the painful area. You may be told to use ice for 24-48 hours after a flare-up starts. Put ice in a plastic bag. Place a towel between your skin and the bag. Leave the ice on for 20 minutes, 2-3 times a day. If told, put heat on the painful area. Do this as often as told by your doctor. Use the heat source that your doctor recommends, such as a moist heat pack or a heating pad. Place a towel between your skin and the heat source. Leave the heat on for 20-30 minutes. If your skin turns bright red, take off the ice or heat right away to prevent skin damage. The risk of damage is higher if you cannot feel pain, heat, or cold. Soak in a warm bath. This can help with pain. Activity        Avoid bending and other activities that make the pain worse. When you stand: Keep your upper back and neck straight. Keep your shoulders pulled back. Avoid slouching. When you sit: Keep your back straight. Relax your shoulders. Do not round your shoulders or pull them backward. Do not sit or stand in one place for too long. Take short rest breaks during the day. Lying down or standing is often better than sitting. Resting can help relieve pain. When sitting or lying down for a long time, do some mild activity or stretching. This will help to prevent stiffness and pain. Get regular exercise. Ask your doctor what activities are safe for you. You may have to avoid lifting. Ask your provider how much you can safely lift. If you lift things: Bend your knees. Keep the weight close to your body. Avoid twisting. Medicines Take over-the-counter and  prescription medicines only as told by your doctor. You may need to take medicines for pain and swelling. These may be taken by mouth or put on the skin. You may also be given muscle relaxants. Ask your doctor if the medicine prescribed to you: Requires you to avoid driving or using machinery. Can cause trouble pooping (constipation). You may need to take these actions to prevent or treat trouble pooping: Drink enough fluid to keep your pee (urine) pale yellow. Take over-the-counter or prescription medicines. Eat foods that are high in fiber. These include beans, whole grains, and fresh fruits and vegetables. Limit foods that are high in fat and sugars. These include fried or sweet foods. General instructions  Sleep on a firm mattress. Try lying on your side with your knees slightly bent. If you lie on your back, put a pillow under your knees. Do not smoke or use any products that contain nicotine or tobacco. If you need help quitting, ask your doctor. Contact a doctor if: Your pain does not get better with rest or medicine. You have new pain. You have a fever. You lose weight quickly. You have trouble doing your normal activities. One or both of your legs or feet feel weak. One or both of your legs or feet lose feeling (have numbness). Get help right away if: You are not able to control when you pee   or poop. You have bad back pain and: You feel like you may vomit (nauseous). You vomit. You have pain in your chest or your belly (abdomen). You have shortness of breath. You faint. These symptoms may be an emergency. Get help right away. Call 911. Do not wait to see if the symptoms will go away. Do not drive yourself to the hospital. This information is not intended to replace advice given to you by your health care provider. Make sure you discuss any questions you have with your health care provider. Document Revised: 11/05/2021 Document Reviewed: 11/05/2021 Elsevier Patient Education   2024 [employer]  

## 2023-01-29 LAB — CBC WITH DIFFERENTIAL/PLATELET
Basophils Absolute: 0 10*3/uL (ref 0.0–0.2)
Basos: 0 %
EOS (ABSOLUTE): 0.2 10*3/uL (ref 0.0–0.4)
Eos: 4 %
Hematocrit: 42.8 % (ref 34.0–46.6)
Hemoglobin: 13.9 g/dL (ref 11.1–15.9)
Immature Grans (Abs): 0 10*3/uL (ref 0.0–0.1)
Immature Granulocytes: 0 %
Lymphocytes Absolute: 2.4 10*3/uL (ref 0.7–3.1)
Lymphs: 42 %
MCH: 30.7 pg (ref 26.6–33.0)
MCHC: 32.5 g/dL (ref 31.5–35.7)
MCV: 95 fL (ref 79–97)
Monocytes Absolute: 0.5 10*3/uL (ref 0.1–0.9)
Monocytes: 8 %
Neutrophils Absolute: 2.6 10*3/uL (ref 1.4–7.0)
Neutrophils: 46 %
Platelets: 269 10*3/uL (ref 150–450)
RBC: 4.53 x10E6/uL (ref 3.77–5.28)
RDW: 12.6 % (ref 11.7–15.4)
WBC: 5.7 10*3/uL (ref 3.4–10.8)

## 2023-01-29 LAB — THYROID PANEL WITH TSH
Free Thyroxine Index: 2 (ref 1.2–4.9)
T3 Uptake Ratio: 24 % (ref 24–39)
T4, Total: 8.3 ug/dL (ref 4.5–12.0)
TSH: 3.03 u[IU]/mL (ref 0.450–4.500)

## 2023-01-29 LAB — LIPID PANEL
Chol/HDL Ratio: 3.2 ratio (ref 0.0–4.4)
Cholesterol, Total: 180 mg/dL (ref 100–199)
HDL: 57 mg/dL (ref >39)
LDL Chol Calc (NIH): 101 mg/dL — ABNORMAL HIGH (ref 0–99)
Triglycerides: 123 mg/dL (ref 0–149)
VLDL Cholesterol Cal: 22 mg/dL (ref 5–40)

## 2023-01-29 LAB — CMP14+EGFR
ALT: 25 IU/L (ref 0–32)
AST: 28 IU/L (ref 0–40)
Albumin: 4.5 g/dL (ref 3.9–4.9)
Alkaline Phosphatase: 114 [IU]/L (ref 44–121)
BUN/Creatinine Ratio: 18 (ref 12–28)
BUN: 13 mg/dL (ref 8–27)
Bilirubin Total: 1.1 mg/dL (ref 0.0–1.2)
CO2: 25 mmol/L (ref 20–29)
Calcium: 10.5 mg/dL — ABNORMAL HIGH (ref 8.7–10.3)
Chloride: 102 mmol/L (ref 96–106)
Creatinine, Ser: 0.74 mg/dL (ref 0.57–1.00)
Globulin, Total: 2.4 g/dL (ref 1.5–4.5)
Glucose: 110 mg/dL — ABNORMAL HIGH (ref 70–99)
Potassium: 4.3 mmol/L (ref 3.5–5.2)
Sodium: 141 mmol/L (ref 134–144)
Total Protein: 6.9 g/dL (ref 6.0–8.5)
eGFR: 88 mL/min/{1.73_m2} (ref >59)

## 2023-03-03 DIAGNOSIS — Z853 Personal history of malignant neoplasm of breast: Secondary | ICD-10-CM | POA: Diagnosis not present

## 2023-03-04 ENCOUNTER — Encounter: Payer: Self-pay | Admitting: Hematology and Oncology

## 2023-03-21 ENCOUNTER — Encounter: Payer: Self-pay | Admitting: Nurse Practitioner

## 2023-04-29 ENCOUNTER — Ambulatory Visit (INDEPENDENT_AMBULATORY_CARE_PROVIDER_SITE_OTHER): Payer: Medicare Other | Admitting: Nurse Practitioner

## 2023-04-29 ENCOUNTER — Encounter: Payer: Self-pay | Admitting: Nurse Practitioner

## 2023-04-29 DIAGNOSIS — M4316 Spondylolisthesis, lumbar region: Secondary | ICD-10-CM | POA: Diagnosis not present

## 2023-04-29 MED ORDER — ACETAMINOPHEN-CODEINE 300-30 MG PO TABS: 1.0000 | ORAL_TABLET | Freq: Two times a day (BID) | ORAL | 2 refills | Status: DC | PRN

## 2023-04-29 NOTE — Progress Notes (Signed)
Subjective:    Patient ID: Julia Poole, female    DOB: 1955-10-26, 68 y.o.   MRN: [id number]    Chief Complaint: pain management  HPI Pain assessment: Cause of pain- spondylolisesis Pain location- lower back Pain on scale of 1-10- 4/10 today Frequency- daily What increases pain-to much activity What makes pain Better-rest helps Effects on ADL - none Any change in general medical condition-no  Current opioids rx- tylenol #3 daily # meds rx- 30 Effectiveness of current meds-helps Adverse reactions from pain meds-none Morphine equivalent-  Pill count performed-No Last drug screen - 07/30/22 ( high risk q3m, moderate risk q15m, low risk yearly ) Urine drug screen today- No Was the NCCSR reviewed- yes  If yes were their any concerning findings? - no   Overdose risk: 1    10/14/2018   10:25 AM  Opioid Risk   Alcohol 0  Illegal Drugs 0  Rx Drugs 0  Alcohol 0  Illegal Drugs 0  Rx Drugs 0  Age between 16-45 years  0  History of Preadolescent Sexual Abuse 0  Psychological Disease 0  Depression 0  Opioid Risk Tool Scoring 0  Opioid Risk Interpretation Low Risk     Pain contract signed on:  Patient Active Problem List   Diagnosis Date Noted   Ductal carcinoma in situ (DCIS) of right breast 04/04/2022   Genetic testing 01/18/2016   Family history of breast cancer in female    Family history of colon cancer    Family history of ovarian cancer    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  Eyes:  Negative for pain.  Respiratory:  Negative for shortness of breath.   Cardiovascular:  Negative for chest pain, palpitations and leg swelling.  Gastrointestinal:  Negative for abdominal pain.  Endocrine: Negative for  polydipsia.  Skin:  Negative for rash.  Neurological:  Negative for dizziness, weakness and headaches.  Hematological:  Does not bruise/bleed easily.  All other systems reviewed and are negative.      Objective:   Physical Exam Constitutional:      Appearance: Normal appearance.  Cardiovascular:     Rate and Rhythm: Normal rate and regular rhythm.     Heart sounds: Normal heart sounds.  Pulmonary:     Breath sounds: Normal breath sounds.  Skin:    General: Skin is warm.  Neurological:     General: No focal deficit present.     Mental Status: She is alert and oriented to person, place, and time.  Psychiatric:        Mood and Affect: Mood normal.        Behavior: Behavior normal.    BP 128/75   Pulse 79   Temp 97.6 F (36.4 C) (Temporal)   Ht 5\' 5"  (1.651 m)   Wt 190 lb (86.2 kg)   SpO2 100%   BMI 31.62 kg/m          Assessment & Plan:   Clemence Lengyel Charlie comes in today with chief complaint of No chief complaint on file.   Diagnosis and orders addressed:  1. Spondylolisthesis of lumbar region Moist heat rest - acetaminophen-codeine (TYLENOL #3) 300-30 MG tablet; Take 1 tablet by mouth every 4 (four) hours as needed for moderate  pain.  Dispense: 30 tablet; Refill: 2   Follow up plan: 3 months   Mary-Margaret Daphine Deutscher, FNP

## 2023-04-29 NOTE — Patient Instructions (Signed)
Chronic Back Pain Chronic back pain is back pain that lasts longer than 3 months. The pain may get worse at certain times (flare-ups). There are things you can do at home to manage your pain. Follow these instructions at home: Watch for any changes in your symptoms. Take these actions to help with your pain: Managing pain and stiffness     If told, put ice on the painful area. You may be told to use ice for 24-48 hours after a flare-up starts. Put ice in a plastic bag. Place a towel between your skin and the bag. Leave the ice on for 20 minutes, 2-3 times a day. If told, put heat on the painful area. Do this as often as told by your doctor. Use the heat source that your doctor recommends, such as a moist heat pack or a heating pad. Place a towel between your skin and the heat source. Leave the heat on for 20-30 minutes. If your skin turns bright red, take off the ice or heat right away to prevent skin damage. The risk of damage is higher if you cannot feel pain, heat, or cold. Soak in a warm bath. This can help with pain. Activity        Avoid bending and other activities that make the pain worse. When you stand: Keep your upper back and neck straight. Keep your shoulders pulled back. Avoid slouching. When you sit: Keep your back straight. Relax your shoulders. Do not round your shoulders or pull them backward. Do not sit or stand in one place for too long. Take short rest breaks during the day. Lying down or standing is often better than sitting. Resting can help relieve pain. When sitting or lying down for a long time, do some mild activity or stretching. This will help to prevent stiffness and pain. Get regular exercise. Ask your doctor what activities are safe for you. You may have to avoid lifting. Ask your provider how much you can safely lift. If you lift things: Bend your knees. Keep the weight close to your body. Avoid twisting. Medicines Take over-the-counter and  prescription medicines only as told by your doctor. You may need to take medicines for pain and swelling. These may be taken by mouth or put on the skin. You may also be given muscle relaxants. Ask your doctor if the medicine prescribed to you: Requires you to avoid driving or using machinery. Can cause trouble pooping (constipation). You may need to take these actions to prevent or treat trouble pooping: Drink enough fluid to keep your pee (urine) pale yellow. Take over-the-counter or prescription medicines. Eat foods that are high in fiber. These include beans, whole grains, and fresh fruits and vegetables. Limit foods that are high in fat and sugars. These include fried or sweet foods. General instructions  Sleep on a firm mattress. Try lying on your side with your knees slightly bent. If you lie on your back, put a pillow under your knees. Do not smoke or use any products that contain nicotine or tobacco. If you need help quitting, ask your doctor. Contact a doctor if: Your pain does not get better with rest or medicine. You have new pain. You have a fever. You lose weight quickly. You have trouble doing your normal activities. One or both of your legs or feet feel weak. One or both of your legs or feet lose feeling (have numbness). Get help right away if: You are not able to control when you pee  or poop. You have bad back pain and: You feel like you may vomit (nauseous). You vomit. You have pain in your chest or your belly (abdomen). You have shortness of breath. You faint. These symptoms may be an emergency. Get help right away. Call 911. Do not wait to see if the symptoms will go away. Do not drive yourself to the hospital. This information is not intended to replace advice given to you by your health care provider. Make sure you discuss any questions you have with your health care provider. Document Revised: 11/05/2021 Document Reviewed: 11/05/2021 Elsevier Patient Education   2024 [employer].

## 2023-05-05 ENCOUNTER — Other Ambulatory Visit: Payer: Self-pay | Admitting: Hematology and Oncology

## 2023-05-05 ENCOUNTER — Ambulatory Visit: Payer: Medicare Other

## 2023-05-05 VITALS — Ht 65.0 in | Wt 190.0 lb

## 2023-05-05 DIAGNOSIS — Z0000 Encounter for general adult medical examination without abnormal findings: Secondary | ICD-10-CM

## 2023-05-05 NOTE — Patient Instructions (Signed)
Julia Poole , Thank you for taking time to come for your Medicare Wellness Visit. I appreciate your ongoing commitment to your health goals. Please review the following plan we discussed and let me know if I can assist you in the future.   Referrals/Orders/Follow-Ups/Clinician Recommendations: Aim for 30 minutes of exercise or brisk walking, 6-8 glasses of water, and 5 servings of fruits and vegetables each day.  This is a list of the screening recommended for you and due dates:  Health Maintenance  Topic Date Due   DEXA scan (bone density measurement)  05/02/2023   COVID-19 Vaccine (4 - 2024-25 season) 05/15/2023*   DTaP/Tdap/Td vaccine (1 - Tdap) 04/28/2024*   Medicare Annual Wellness Visit  05/04/2024   Mammogram  03/02/2025   Colon Cancer Screening  12/08/2026   Pneumonia Vaccine  Completed   Flu Shot  Completed   Hepatitis C Screening  Completed   Zoster (Shingles) Vaccine  Completed   HPV Vaccine  Aged Out  *Topic was postponed. The date shown is not the original due date.    Advanced directives: (ACP Link)Information on Advanced Care Planning can be found at [facility] [place] Advance Health Care Directives Advance Health Care Directives ([email or web address]   Next Medicare Annual Wellness Visit scheduled for next year: Yes

## 2023-05-05 NOTE — Progress Notes (Addendum)
Subjective:   Julia Poole is a 68 y.o. female who presents for Medicare Annual (Subsequent) preventive examination.  Visit Complete: Virtual I connected with  Julia Poole on 05/05/23 by a audio enabled telemedicine application and verified that I am speaking with the correct person using two identifiers.  Patient Location: Home  Provider Location: Home Office  This patient declined Interactive audio and video telecommunications. Therefore the visit was completed with audio only.  I discussed the limitations of evaluation and management by telemedicine. The patient expressed understanding and agreed to proceed.  Vital Signs: Because this visit was a virtual/telehealth visit, some criteria may be missing or patient reported. Any vitals not documented were not able to be obtained and vitals that have been documented are patient reported.  Cardiac Risk Factors include: advanced age (>57men, >29 women);dyslipidemia     Objective:    Today's Vitals   05/05/23 1238  Weight: 190 lb (86.2 kg)  Height: 5\' 5"  (1.651 m)   Body mass index is 31.62 kg/m.     05/05/2023   12:41 PM 05/01/2022    1:42 PM 02/26/2022    7:41 AM 02/15/2022    1:50 PM 02/14/2021   10:37 AM 01/24/2020    1:21 PM 01/20/2019    1:35 PM  Advanced Directives  Does Patient Have a Medical Advance Directive? No No No No No No No  Would patient like information on creating a medical advance directive? Yes (MAU/Ambulatory/Procedural Areas - Information given) No - Patient declined No - Patient declined No - Patient declined No - Patient declined No - Patient declined No - Patient declined    Current Medications (verified) Outpatient Encounter Medications as of 05/05/2023  Medication Sig   acetaminophen-codeine (TYLENOL #3) 300-30 MG tablet Take 1 tablet by mouth 2 (two) times daily as needed for moderate pain (pain score 4-6).   anastrozole (ARIMIDEX) 1 MG tablet Take 1 tablet (1 mg total) by mouth daily.    Ascorbic Acid (VITAMIN C) 500 MG tablet Take 500 mg by mouth daily.    aspirin 81 MG tablet Take 81 mg by mouth daily.     Biotin 10 MG CAPS Take by mouth.   Boswellia-Glucosamine-Vit D (OSTEO BI-FLEX ONE PER DAY PO) Take by mouth.   Cholecalciferol (VITAMIN D) 2000 UNITS tablet Take 2,000 Units by mouth 2 (two) times daily.   Cyanocobalamin (VITAMIN B 12 PO) Take by mouth.   FLAXSEED, LINSEED, PO Take by mouth.   Glucosamine-Chondroitin (OSTEO BI-FLEX REGULAR STRENGTH PO) Take by mouth.   levothyroxine (SYNTHROID) 50 MCG tablet Take 1 tablet (50 mcg total) by mouth daily.   Multiple Vitamins-Minerals (CENTRUM SILVER PO) Take by mouth.     pseudoephedrine (SUDAFED) 30 MG tablet Take 30 mg by mouth every 4 (four) hours as needed for congestion.   psyllium (REGULOID) 0.52 g capsule Take 0.52 g by mouth daily.   rosuvastatin (CRESTOR) 10 MG tablet Take 1 tablet (10 mg total) by mouth daily.   TURMERIC PO Take by mouth.   vitamin E 180 MG (400 UNITS) capsule Take 400 Units by mouth daily.   Facility-Administered Encounter Medications as of 05/05/2023  Medication   0.9 %  sodium chloride infusion    Allergies (verified) Norco [hydrocodone-acetaminophen]   History: Past Medical History:  Diagnosis Date   Allergy    Arthritis    Bleeding nose    Cancer (HCC) 2010   right breast   Cancer (HCC) 09/2015   brain  Cataract    Degenerative disk disease 2012   Family history of breast cancer in female    Family history of colon cancer    Family history of ovarian cancer    Hemorrhoids    Hyperlipidemia    Sinus problem    Thyroid disease    Past Surgical History:  Procedure Laterality Date   BRAIN SURGERY     BREAST LUMPECTOMY WITH RADIOACTIVE SEED LOCALIZATION Right 02/26/2022   Procedure: RIGHT BREAST LUMPECTOMY WITH RADIOACTIVE SEED LOCALIZATION;  Surgeon: Emelia Loron, MD;  Location: [facility];  Service: General;  Laterality: Right;   BREAST SURGERY  AUG.  2010   right breast lumpectomy   BREAST SURGERY     Cancer   COLONOSCOPY     CRANIOTOMY Left 10/16/2015   Procedure: PARA-SAGITTAL CRANIOTOMY WITH STEREOTACTIC NAVIGATION FOR TUMOR;  Surgeon: Loura Halt Ditty, MD;  Location: [facility];  Service: Neurosurgery;  Laterality: Left;   GANGLION CYST EXCISION  SEP. 1997   RIGHT WRIST   Family History  Problem Relation Age of Onset   Colon polyps Mother    Colon cancer Mother 11   Diabetes Father    Heart disease Father    Cancer Father        skin   Colon polyps Sister    Leukemia Sister 94       CLL   Breast cancer Brother 36   Cancer Brother        bone mets   Cancer Brother        skin   Ovarian cancer Maternal Aunt        dx in her 30s   Tuberculosis Maternal Grandmother    Heart disease Maternal Grandfather    Diabetes Paternal Grandmother    Alcohol abuse Paternal Grandfather    Esophageal cancer Neg Hx    Rectal cancer Neg Hx    Stomach cancer Neg Hx    Social History   Socioeconomic History   Marital status: Married    Spouse name: Fayrene Fearing   Number of children: 1   Years of education: 12   Highest education level: 12th grade  Occupational History   Occupation: Engineer, structural. [employer]  Tobacco Use   Smoking status: Never   Smokeless tobacco: Never  Vaping Use   Vaping status: Never Used  Substance and Sexual Activity   Alcohol use: No   Drug use: No   Sexual activity: Yes    Birth control/protection: Post-menopausal  Other Topics Concern   Not on file  Social History Narrative   Married 27 years on 01/18/2021.   1 son and 2 step children, son and daughter.   Social Drivers of Corporate investment banker Strain: Low Risk  (05/05/2023)   Overall Financial Resource Strain (CARDIA)    Difficulty of Paying Living Expenses: Not hard at all  Food Insecurity: No Food Insecurity (05/05/2023)   Hunger Vital Sign    Worried About Running Out of Food in the Last Year: Never true    Ran Out of Food in the Last Year:  Never true  Transportation Needs: No Transportation Needs (05/05/2023)   PRAPARE - Administrator, Civil Service (Medical): No    Lack of Transportation (Non-Medical): No  Physical Activity: Sufficiently Active (05/05/2023)   Exercise Vital Sign    Days of Exercise per Week: 5 days    Minutes of Exercise per Session: 30 min  Stress: No Stress Concern Present (05/05/2023)  [employer] of Occupational Health - Occupational Stress Questionnaire    Feeling of Stress : Not at all  Social Connections: Socially Integrated (05/05/2023)   Social Connection and Isolation Panel [NHANES]    Frequency of Communication with Friends and Family: More than three times a week    Frequency of Social Gatherings with Friends and Family: Three times a week    Attends Religious Services: More than 4 times per year    Active Member of Clubs or Organizations: Yes    Attends Engineer, structural: More than 4 times per year    Marital Status: Married    Tobacco Counseling Counseling given: Not Answered   Clinical Intake:  Pre-visit preparation completed: Yes  Pain : No/denies pain     Diabetes: No  How often do you need to have someone help you when you read instructions, pamphlets, or other written materials from your doctor or pharmacy?: 1 - Never  Interpreter Needed?: No  Information entered by :: Kandis Fantasia LPN   Activities of Daily Living    05/05/2023   12:41 PM  In your present state of health, do you have any difficulty performing the following activities:  Hearing? 0  Vision? 0  Difficulty concentrating or making decisions? 0  Walking or climbing stairs? 0  Dressing or bathing? 0  Doing errands, shopping? 0  Preparing Food and eating ? N  Using the Toilet? N  In the past six months, have you accidently leaked urine? N  Do you have problems with loss of bowel control? N  Managing your Medications? N  Managing your Finances? N  Housekeeping or managing  your Housekeeping? N    Patient Care Team: Bennie Pierini, FNP as PCP - General (Nurse Practitioner) Wenda Overland, MD as Referring Physician (Internal Medicine) Lisbeth Renshaw, MD as Consulting Physician (Neurosurgery) Serena Croissant, MD as Consulting Physician (Hematology and Oncology) [facility] Annamaria Helling, MD as Consulting Physician (Obstetrics and Gynecology) Emelia Loron, MD as Consulting Physician (General Surgery) Axel Filler, Larna Daughters, NP as Nurse Practitioner (Hematology and Oncology)  Indicate any recent Medical Services you may have received from other than [facility] providers in the past year (date may be approximate).     Assessment:   This is a routine wellness examination for Jinx.  Hearing/Vision screen Hearing Screening - Comments:: Denies hearing difficulties   Vision Screening - Comments:: No vision problems; will schedule routine eye exam soon     Goals Addressed             This Visit's Progress    Remain active and independent        Depression Screen    05/05/2023   12:40 PM 04/29/2023   10:30 AM 10/25/2022   11:19 AM 07/30/2022    9:30 AM 05/01/2022    1:45 PM 01/24/2022   10:09 AM 07/27/2021   11:54 AM  PHQ 2/9 Scores  PHQ - 2 Score 0 0 0 0 0  0  PHQ- 9 Score   0 0   0  Exception Documentation      Patient refusal     Fall Risk    05/05/2023   12:41 PM 04/29/2023   10:30 AM 10/25/2022   11:19 AM 07/30/2022    9:29 AM 05/01/2022    1:42 PM  Fall Risk   Falls in the past year? 0 0 1 1 0  Number falls in past yr: 0  0 0 0  Injury with Fall?  0  1 1 0  Risk for fall due to : No Fall Risks  History of fall(s) History of fall(s)   Follow up Falls prevention discussed;Education provided;Falls evaluation completed  Education provided Education provided Falls prevention discussed;Education provided;Falls evaluation completed    MEDICARE RISK AT HOME: Medicare Risk at Home Any stairs in or around the home?:  No If so, are there any without handrails?: No Home free of loose throw rugs in walkways, pet beds, electrical cords, etc?: Yes Adequate lighting in your home to reduce risk of falls?: Yes Life alert?: No Use of a cane, walker or w/c?: No Grab bars in the bathroom?: Yes Shower chair or bench in shower?: No Elevated toilet seat or a handicapped toilet?: Yes  TIMED UP AND GO:  Was the test performed?  No    Cognitive Function:    09/25/2017    3:18 PM 08/28/2016    4:22 PM  MMSE - Mini Mental State Exam  Orientation to time 4 5  Orientation to Place 5 5  Registration 3 3  Attention/ Calculation 5 5  Recall 3 3  Language- name 2 objects 2 2  Language- repeat 1 1  Language- follow 3 step command 3 3  Language- read & follow direction 1 1  Write a sentence 1 1  Copy design 1 1  Total score 29 30        05/05/2023   12:41 PM 05/01/2022    1:43 PM 02/14/2021   10:46 AM 01/24/2020    1:30 PM 01/20/2019    1:38 PM  6CIT Screen  What Year? 0 points 0 points 0 points  0 points  What month? 0 points 0 points 0 points  0 points  What time? 0 points 0 points 0 points 0 points 0 points  Count back from 20 0 points 0 points 0 points 0 points 0 points  Months in reverse 0 points 0 points 0 points 0 points 0 points  Repeat phrase 0 points 0 points 0 points 2 points 0 points  Total Score 0 points 0 points 0 points  0 points    Immunizations Immunization History  Administered Date(s) Administered   Fluad Quad(high Dose 65+) 01/27/2019, 01/27/2020, 02/08/2021, 01/24/2022   Fluad Trivalent(High Dose 65+) 01/28/2023   Influenza,inj,Quad PF,6+ Mos 01/01/2013, 01/19/2014, 02/09/2015, 02/26/2016, 01/06/2018   Influenza-Unspecified 12/30/2013   Moderna Sars-Covid-2 Vaccination 08/16/2019, 09/13/2019, 05/02/2020   Pneumococcal Conjugate-13 01/27/2019   Pneumococcal Polysaccharide-23 01/27/2020   Zoster Recombinant(Shingrix) 04/30/2021, 07/27/2021   Zoster, Live 05/19/2015    TDAP  status: Due, Education has been provided regarding the importance of this vaccine. Advised may receive this vaccine at local pharmacy or Health Dept. Aware to provide a copy of the vaccination record if obtained from local pharmacy or Health Dept. Verbalized acceptance and understanding.  Flu Vaccine status: Up to date  Pneumococcal vaccine status: Up to date  Covid-19 vaccine status: Information provided on how to obtain vaccines.   Qualifies for Shingles Vaccine? Yes   Zostavax completed Yes   Shingrix Completed?: Yes  Screening Tests Health Maintenance  Topic Date Due   DEXA SCAN  05/02/2023   COVID-19 Vaccine (4 - 2024-25 season) 05/15/2023 (Originally 12/01/2022)   DTaP/Tdap/Td (1 - Tdap) 04/28/2024 (Originally 10/31/1974)   Medicare Annual Wellness (AWV)  05/04/2024   MAMMOGRAM  03/02/2025   Colonoscopy  12/08/2026   Pneumonia Vaccine 34+ Years old  Completed   INFLUENZA VACCINE  Completed   Hepatitis C Screening  Completed   Zoster Vaccines- Shingrix  Completed   HPV VACCINES  Aged Out    Health Maintenance  Health Maintenance Due  Topic Date Due   DEXA SCAN  05/02/2023    Colorectal cancer screening: Type of screening: Colonoscopy. Completed 12/07/21. Repeat every 5 years  Mammogram status: Completed 03/03/23. Repeat every year  Bone Density status: Completed 05/01/21. Results reflect: Bone density results: OSTEOPENIA. Repeat every 2 years.  Lung Cancer Screening: (Low Dose CT Chest recommended if Age 52-80 years, 20 pack-year currently smoking OR have quit w/in 15years.) does not qualify.   Lung Cancer Screening Referral: n/a  Additional Screening:  Hepatitis C Screening: does qualify; Completed 02/09/15  Vision Screening: Recommended annual ophthalmology exams for early detection of glaucoma and other disorders of the eye. Is the patient up to date with their annual eye exam?  Yes  Who is the provider or what is the name of the office in which the patient attends  annual eye exams? Unable to provide name (possibly [place])  If pt is not established with a provider, would they like to be referred to a provider to establish care? No .   Dental Screening: Recommended annual dental exams for proper oral hygiene  Community Resource Referral / Chronic Care Management: CRR required this visit?  No   CCM required this visit?  No     Plan:     I have personally reviewed and noted the following in the patient's chart:   Medical and social history Use of alcohol, tobacco or illicit drugs  Current medications and supplements including opioid prescriptions. Patient is not currently taking opioid prescriptions. Functional ability and status Nutritional status Physical activity Advanced directives List of other physicians Hospitalizations, surgeries, and ER visits in previous 12 months Vitals Screenings to include cognitive, depression, and falls Referrals and appointments  In addition, I have reviewed and discussed with patient certain preventive protocols, quality metrics, and best practice recommendations. A written personalized care plan for preventive services as well as general preventive health recommendations were provided to patient.     Kandis Fantasia [place], [place]   04/06/1094   After Visit Summary: (MyChart) Due to this being a telephonic visit, the after visit summary with patients personalized plan was offered to patient via MyChart   Nurse Notes: No concerns at this time   I have reviewed and agree with the above AWV documentation.   Mary-Margaret Daphine Deutscher, FNP

## 2023-05-15 NOTE — Care Management (Signed)
attestation

## 2023-05-21 ENCOUNTER — Encounter: Payer: Self-pay | Admitting: Genetic Counselor

## 2023-07-14 ENCOUNTER — Inpatient Hospital Stay: Payer: Medicare Other | Attending: Hematology and Oncology | Admitting: Hematology and Oncology

## 2023-07-14 VITALS — BP 136/64 | HR 72 | Resp 18 | Ht 65.0 in | Wt 191.5 lb

## 2023-07-14 DIAGNOSIS — Z79811 Long term (current) use of aromatase inhibitors: Secondary | ICD-10-CM | POA: Insufficient documentation

## 2023-07-14 DIAGNOSIS — Z170 Estrogen receptor positive status [ER+]: Secondary | ICD-10-CM | POA: Diagnosis not present

## 2023-07-14 DIAGNOSIS — C50911 Malignant neoplasm of unspecified site of right female breast: Secondary | ICD-10-CM | POA: Insufficient documentation

## 2023-07-14 DIAGNOSIS — R232 Flushing: Secondary | ICD-10-CM | POA: Diagnosis not present

## 2023-07-14 DIAGNOSIS — Z885 Allergy status to narcotic agent status: Secondary | ICD-10-CM | POA: Insufficient documentation

## 2023-07-14 DIAGNOSIS — Z79899 Other long term (current) drug therapy: Secondary | ICD-10-CM | POA: Insufficient documentation

## 2023-07-14 DIAGNOSIS — Z1721 Progesterone receptor positive status: Secondary | ICD-10-CM | POA: Insufficient documentation

## 2023-07-14 NOTE — Assessment & Plan Note (Signed)
 August 2010: Right breast DCIS status postlumpectomy radiation and tamoxifen x 5 years   01/21/2022: Screening mammogram detected abnormality which on diagnostic mammogram showed group of coarse calcifications UOQ right breast. 02/26/2022: Right lumpectomy: Scattered foci of DCIS intermediate grade discontinuously involving a fibrotic area of 0.9 cm.  No invasive cancer.  Margins negative.  Right additional superior medial margin excision: Focal DCIS intermediate grade margins negative ER 100%, PR 100%   Treatment plan: Adjuvant antiestrogen therapy with anastrozole 1 mg daily x 5 years Since she had prior radiation she is not a candidate for radiation again.   Anastrozole toxicities:  Breast cancer surveillance: Breast exam: 07/14/2023: Benign Mammogram 03/03/2023: [facility]: Benign breast density category B Return to clinic in 1 year for follow-up

## 2023-07-14 NOTE — Progress Notes (Signed)
 Patient Care Team: Bennie Pierini, FNP as PCP - General (Nurse Practitioner) Wenda Overland, MD as Referring Physician (Internal Medicine) Lisbeth Renshaw, MD as Consulting Physician (Neurosurgery) Serena Croissant, MD as Consulting Physician (Hematology and Oncology) [facility] Annamaria Helling, MD as Consulting Physician (Obstetrics and Gynecology) Emelia Loron, MD as Consulting Physician (General Surgery) Axel Filler Larna Daughters, NP as Nurse Practitioner (Hematology and Oncology)  DIAGNOSIS:  Encounter Diagnosis  Name Primary?   Malignant neoplasm of right female breast, unspecified estrogen receptor status, unspecified site of breast (HCC) Yes    SUMMARY OF ONCOLOGIC HISTORY: Oncology History  Breast cancer, right breast (HCC)  10/2008 Miscellaneous   Right breast DCIS status postlumpectomy, radiation and tamoxifen x 5 years   05/16/2011 Cancer Staging   Staging form: Breast, AJCC 8th Edition - Pathologic: Stage 0 (pTis (DCIS), pN0, cM0, ER+, PR+) - Signed by Loa Socks, NP on 07/12/2022   01/16/2016 Genetic Testing   ATM [id number] pathogenic variant, APC c.1631T>C and APC c.6873A>T VUSs found on the Common Hereditary cancer panel.  The Hereditary Gene Panel offered by [facility] includes sequencing and/or deletion duplication testing of the following 42 genes: APC, ATM, AXIN2, BARD1, BMPR1A, BRCA1, BRCA2, BRIP1, CDH1, CDKN2A, CHEK2, DICER1, EPCAM, GREM1, KIT, MEN1, MLH1, MSH2, MSH6, MUTYH, NBN, NF1, PALB2, PDGFRA, PMS2, POLD1, POLE, PTEN, RAD50, RAD51C, RAD51D, SDHA, SDHB, SDHC, SDHD, SMAD4, SMARCA4. STK11, TP53, TSC1, TSC2, and VHL.  The report date is January 15, 2016.  The APC c.1631T>C and APC c.6873A>T VUSs were both reclassified as Likely Benign.  The updated report date is June 01, 2017.   01/21/2022 Initial Diagnosis   Screening mammogram detected calcifications in the right breast upper outer quadrant   02/26/2022 Surgery    Right lumpectomy: Scattered foci of DCIS intermediate grade discontinuously involving a fibrotic area of 0.9 cm.  No invasive cancer.  Margins negative.  Right additional superior medial margin excision: Focal DCIS intermediate grade margins negative ER 100%, PR 100%   04/2022 -  Anti-estrogen oral therapy   Anastrozole     CHIEF COMPLIANT: Surveillance of breast cancer  HISTORY OF PRESENT ILLNESS:   History of Present Illness The patient, with a history of breast cancer, presents for a routine follow-up. She reports taking anastrozole with only mild, infrequent hot flashes that are manageable. She denies any breast pain or discomfort, but notes she can occasionally feel a 'blocker' that was placed due to a concerning finding a few years ago. She also takes Metamucil gummies for gut health.  The patient had a mammogram in December, which showed good results. She reports no other health concerns at this time.     ALLERGIES:  is allergic to norco [hydrocodone-acetaminophen].  MEDICATIONS:  Current Outpatient Medications  Medication Sig Dispense Refill   acetaminophen-codeine (TYLENOL #3) 300-30 MG tablet Take 1 tablet by mouth 2 (two) times daily as needed for moderate pain (pain score 4-6). 60 tablet 2   anastrozole (ARIMIDEX) 1 MG tablet TAKE ONE TABLET DAILY 90 tablet 3   Ascorbic Acid (VITAMIN C) 500 MG tablet Take 500 mg by mouth daily.      aspirin 81 MG tablet Take 81 mg by mouth daily.       Biotin 10 MG CAPS Take by mouth.     Boswellia-Glucosamine-Vit D (OSTEO BI-FLEX ONE PER DAY PO) Take by mouth.     Cholecalciferol (VITAMIN D) 2000 UNITS tablet Take 2,000 Units by mouth 2 (two) times daily.     Cyanocobalamin (VITAMIN  B 12 PO) Take by mouth.     FLAXSEED, LINSEED, PO Take by mouth.     Glucosamine-Chondroitin (OSTEO BI-FLEX REGULAR STRENGTH PO) Take by mouth.     levothyroxine (SYNTHROID) 50 MCG tablet Take 1 tablet (50 mcg total) by mouth daily. 90 tablet 1   Multiple  Vitamins-Minerals (CENTRUM SILVER PO) Take by mouth.       pseudoephedrine (SUDAFED) 30 MG tablet Take 30 mg by mouth every 4 (four) hours as needed for congestion.     psyllium (REGULOID) 0.52 g capsule Take 0.52 g by mouth daily.     rosuvastatin (CRESTOR) 10 MG tablet Take 1 tablet (10 mg total) by mouth daily. 90 tablet 1   TURMERIC PO Take by mouth.     vitamin E 180 MG (400 UNITS) capsule Take 400 Units by mouth daily.     Current Facility-Administered Medications  Medication Dose Route Frequency Provider Last Rate Last Admin   0.9 %  sodium chloride infusion  500 mL Intravenous Once Armbruster, Willaim Rayas, MD        PHYSICAL EXAMINATION: ECOG PERFORMANCE STATUS: 1 - Symptomatic but completely ambulatory  Vitals:   07/14/23 1031  BP: 136/64  Pulse: 72  Resp: 18  SpO2: 100%   Filed Weights   07/14/23 1031  Weight: 191 lb 8 oz (86.9 kg)    Physical Exam   (exam performed in the presence of a chaperone)  LABORATORY DATA:  I have reviewed the data as listed    Latest Ref Rng & Units 01/28/2023   11:10 AM 07/30/2022   10:08 AM 01/24/2022   10:44 AM  CMP  Glucose 70 - 99 mg/dL 409  97  99   BUN 8 - 27 mg/dL 13  14  15    Creatinine 0.57 - 1.00 mg/dL 8.11  9.14  7.82   Sodium 134 - 144 mmol/L 141  141  140   Potassium 3.5 - 5.2 mmol/L 4.3  4.0  4.2   Chloride 96 - 106 mmol/L 102  102  103   CO2 20 - 29 mmol/L 25  24  24    Calcium 8.7 - 10.3 mg/dL 95.6  21.3  08.6   Total Protein 6.0 - 8.5 g/dL 6.9  7.0  6.6   Total Bilirubin 0.0 - 1.2 mg/dL 1.1  1.0  0.9   Alkaline Phos 44 - 121 IU/L 114  99  89   AST 0 - 40 IU/L 28  22  23    ALT 0 - 32 IU/L 25  20  16      Lab Results  Component Value Date   WBC 5.7 01/28/2023   HGB 13.9 01/28/2023   HCT 42.8 01/28/2023   MCV 95 01/28/2023   PLT 269 01/28/2023   NEUTROABS 2.6 01/28/2023    ASSESSMENT & PLAN:  Breast cancer, right breast August 2010: Right breast DCIS status postlumpectomy radiation and tamoxifen x 5  years   01/21/2022: Screening mammogram detected abnormality which on diagnostic mammogram showed group of coarse calcifications UOQ right breast. 02/26/2022: Right lumpectomy: Scattered foci of DCIS intermediate grade discontinuously involving a fibrotic area of 0.9 cm.  No invasive cancer.  Margins negative.  Right additional superior medial margin excision: Focal DCIS intermediate grade margins negative ER 100%, PR 100%   Treatment plan: Adjuvant antiestrogen therapy with anastrozole 1 mg daily x 5 years started December 2023 Since she had prior radiation she is not a candidate for radiation again.   Anastrozole toxicities:  Tolerating it extremely well without any problems or concerns.  Breast cancer surveillance: Breast exam: 07/14/2023: Benign Mammogram 03/03/2023: [facility]: Benign breast density category B Return to clinic in 1 year for follow-up      No orders of the defined types were placed in this encounter.  The patient has a good understanding of the overall plan. she agrees with it. she will call with any problems that may develop before the next visit here. Total time spent: 30 mins including face to face time and time spent for planning, charting and co-ordination of care   Viinay K Percy Comp, MD 07/14/23

## 2023-07-29 ENCOUNTER — Encounter: Payer: Self-pay | Admitting: Nurse Practitioner

## 2023-07-29 ENCOUNTER — Ambulatory Visit (INDEPENDENT_AMBULATORY_CARE_PROVIDER_SITE_OTHER): Payer: Medicare Other | Admitting: Nurse Practitioner

## 2023-07-29 VITALS — BP 120/74 | HR 76 | Temp 97.2°F | Ht 65.0 in | Wt 190.0 lb

## 2023-07-29 DIAGNOSIS — M4316 Spondylolisthesis, lumbar region: Secondary | ICD-10-CM | POA: Diagnosis not present

## 2023-07-29 DIAGNOSIS — E782 Mixed hyperlipidemia: Secondary | ICD-10-CM | POA: Diagnosis not present

## 2023-07-29 DIAGNOSIS — E034 Atrophy of thyroid (acquired): Secondary | ICD-10-CM

## 2023-07-29 DIAGNOSIS — D420 Neoplasm of uncertain behavior of cerebral meninges: Secondary | ICD-10-CM

## 2023-07-29 DIAGNOSIS — Z79891 Long term (current) use of opiate analgesic: Secondary | ICD-10-CM | POA: Diagnosis not present

## 2023-07-29 DIAGNOSIS — Z0001 Encounter for general adult medical examination with abnormal findings: Secondary | ICD-10-CM

## 2023-07-29 MED ORDER — ACETAMINOPHEN-CODEINE 300-30 MG PO TABS: 1.0000 | ORAL_TABLET | Freq: Two times a day (BID) | ORAL | 2 refills | Status: DC | PRN

## 2023-07-29 MED ORDER — LEVOTHYROXINE SODIUM 50 MCG PO TABS: 50.0000 ug | ORAL_TABLET | Freq: Every day | ORAL | 1 refills | Status: DC

## 2023-07-29 MED ORDER — ROSUVASTATIN CALCIUM 10 MG PO TABS: 10.0000 mg | ORAL_TABLET | Freq: Every day | ORAL | 1 refills | Status: DC

## 2023-07-29 NOTE — Patient Instructions (Signed)
Cooking With Less Salt Cooking with less salt is one way to reduce the amount of salt (sodium) you get from food. Most people should have less than 2,300 milligrams (mg) of sodium each day. If you have high blood pressure (hypertension), you may need to limit your sodium to 1,500 mg each day. Follow the tips below to help reduce your sodium intake. What are tips for eating less sodium? Reading food labels  Check the food label before buying or using packaged ingredients. Always check the label for the serving size and sodium content. Choose products with less than 140 mg of sodium per serving. Check the % Daily Value column to see what percent of the daily recommended amount of sodium is in one serving of the product. Foods with 5% or less are low in sodium. Foods with 20% or more are high in sodium. Do not choose foods that have salt as one of the first three ingredients on the ingredients list. Always check how much sodium is in a product, even if the label says "unsalted" or "no salt added." Shopping Buy sodium-free or low-sodium products. Look for these words: Low-sodium. Sodium-free. Reduced-sodium. No salt added. Unsalted. Buy fresh or frozen foods without sauces or additives. Cooking Instead of salt, use herbs, seasonings without salt, and spices. Use sodium-free baking soda. Grill, braise, or roast foods to add flavor with less salt. Do not add salt to pasta, rice, or hot cereals. Drain and rinse canned vegetables, beans, and meat before use. Do not add salt when cooking sweets and desserts. Cook with low-sodium ingredients. Meal planning The sodium in bread can add up. Try to plan meals with other grains. These may include whole oats, quinoa, whole wheat pasta, and other whole grains that do not have sodium added to them. What foods are high in sodium? Vegetables Regular canned vegetables, except low-sodium or reduced-sodium items. Sauerkraut, pickled vegetables, and relishes.  Olives. French fries. Onion rings. Regular canned tomato sauce and paste. Regular tomato and vegetable juice. Frozen vegetables in sauces. Grains Instant hot cereals. Bread stuffing, pancake, and biscuit mixes. Croutons. Seasoned rice or pasta mixes. Noodle soup cups. Boxed or frozen macaroni and cheese. Regular salted crackers. Self-rising flour. Rolls. Bagels. Flour tortillas and wraps. Meats and other proteins Meat or fish that is salted, canned, smoked, cured, spiced, or pickled. Precooked or cured meat, such as sausages or meat loaves. Bacon. Ham. Pepperoni. Hot dogs. Corned beef. Chipped beef. Salt pork. Jerky. Pickled herring, anchovies, and sardines. Regular canned tuna. Salted nuts. Dairy Processed cheese and cheese spreads. Hard cheeses. Cheese curds. Blue cheese. Feta cheese. String cheese. Regular cottage cheese. Buttermilk. Canned milk. The items listed above may not be a full list of foods high in sodium. Talk to a dietitian to learn more. What foods are low in sodium? Fruits Fresh, frozen, or canned fruit with no sauce added. Fruit juice. Vegetables Fresh or frozen vegetables with no sauce added. "No salt added" canned vegetables. "No salt added" tomato sauce and paste. Low-sodium or reduced-sodium tomato and vegetable juice. Grains Noodles, pasta, quinoa, rice. Shredded or puffed wheat or puffed rice. Regular or quick oats (not instant). Low-sodium crackers. Low-sodium bread. Whole grain bread and whole grain pasta. Unsalted popcorn. Meats and other proteins Fresh or frozen whole meats, poultry that has not been injected with sodium, and fish with no sauce added. Unsalted nuts. Dried peas, beans, and lentils without added salt. Unsalted canned beans. Eggs. Unsalted nut butters. Low-sodium canned tuna or chicken. Dairy   Milk. Soy milk. Yogurt. Low-sodium cheeses, such as Swiss, Monterey Jack, mozzarella, and ricotta. Sherbet or ice cream (keep to  cup per serving). Cream  cheese. Fats and oils Unsalted butter or margarine. Other foods Homemade pudding. Sodium-free baking soda and baking powder. Herbs and spices. Low-sodium seasoning mixes. Beverages Coffee and tea. Carbonated beverages. The items listed above may not be a full list of foods low in sodium. Talk to a dietitian to learn more. What are some salt alternatives when cooking? Herbs, seasonings, and spices can be used instead of salt to flavor your food. Herbs should be fresh or dried. Do not choose packaged mixes. Next to the name of the herb, spice, or seasoning below are some foods you can pair it with. Herbs Bay leaves - Soups, meat and vegetable dishes, and spaghetti sauce. Basil - Italian dishes, soups, pasta, and fish dishes. Cilantro - Meat, poultry, and vegetable dishes. Chili powder - Marinades and Mexican dishes. Chives - Salad dressings and potato dishes. Cumin - Mexican dishes, couscous, and meat dishes. Dill - Fish dishes, sauces, and salads. Fennel - Meat and vegetable dishes, breads, and cookies. Garlic (do not use garlic salt) - Italian dishes, meat dishes, salad dressings, and sauces. Marjoram - Soups, potato dishes, and meat dishes. Oregano - Pizza and spaghetti sauce. Parsley - Salads, soups, pasta, and meat dishes. Rosemary - Italian dishes, salad dressings, soups, and red meats. Saffron - Fish dishes, pasta, and some poultry dishes. Sage - Stuffings and sauces. Tarragon - Fish and poultry dishes. Thyme - Stuffing, meat, and fish dishes. Seasonings Lemon juice - Fish dishes, poultry dishes, vegetables, and salads. Vinegar - Salad dressings, vegetables, and fish dishes. Spices Cinnamon - Sweet dishes, such as cakes, cookies, and puddings. Cloves - Gingerbread, puddings, and marinades for meats. Curry - Vegetable dishes, fish and poultry dishes, and stir-fry dishes. Ginger - Vegetable dishes, fish dishes, and stir-fry dishes. Nutmeg - Pasta, vegetables, poultry, fish  dishes, and custard. This information is not intended to replace advice given to you by your health care provider. Make sure you discuss any questions you have with your health care provider. Document Revised: 04/11/2022 Document Reviewed: 04/04/2022 Elsevier Patient Education  2024 Elsevier Inc.  

## 2023-07-29 NOTE — Progress Notes (Signed)
 Subjective:    Patient ID: Julia Poole, female    DOB: February 09, 1956, 68 y.o.   MRN: [id number]   Chief Complaint: annual physical   HPI:  Julia Poole is a 68 y.o. who identifies as a female who was assigned female at birth.   Social history: Lives with: husband Work history: retired   Water engineer in today for follow up of the following chronic medical issues:  1. Mixed hyperlipidemia Does not watch diet very closely and does no exercise. Lab Results  Component Value Date   CHOL 180 01/28/2023   HDL 57 01/28/2023   LDLCALC 101 (H) 01/28/2023   TRIG 123 01/28/2023   CHOLHDL 3.2 01/28/2023     2. Hypothyroidism due to acquired atrophy of thyroid  No issues that she is aware of Lab Results  Component Value Date   TSH 3.030 01/28/2023     3. Atypical meningioma of brain (HCC) No recent occurrence. Denies headaches, dizziness of blurred vision.  4. Spondylolisthesis Pain assessment: Cause of pain- spondylolisthesis Pain location- low back pain Pain on scale of 1-10-5-6/10 Frequency- daily What increases pain-to much activity What makes pain Better-rest helps Effects on ADL - none Any change in general medical condition-none  Current opioids rx- tylenol  #3 bid prn # meds rx- 60 Effectiveness of current meds-helsp Adverse reactions from pain meds-none Morphine equivalent- 27 MME  Pill count performed-Yes Last drug screen - 07/30/22 ( high risk q75m, moderate risk q49m, low risk yearly ) Urine drug screen today- No Was the [facility] reviewed- yes  If yes were their any concerning findings? - no   Overdose risk: 1    10/14/2018   10:25 AM  Opioid Risk   Alcohol 0  Illegal Drugs 0  Rx Drugs 0  Alcohol 0  Illegal Drugs 0  Rx Drugs 0  Age between 16-45 years  0  History of Preadolescent Sexual Abuse 0  Psychological Disease 0  Depression 0  Opioid Risk Tool Scoring 0  Opioid Risk Interpretation Low Risk     Pain contract signed on:08/08/22    5. BMI  31.0-31.9,adult Weight is up 8lbs  Wt Readings from Last 3 Encounters:  07/29/23 190 lb (86.2 kg)  07/14/23 191 lb 8 oz (86.9 kg)  05/05/23 190 lb (86.2 kg)   BMI Readings from Last 3 Encounters:  07/29/23 31.62 kg/m  07/14/23 31.87 kg/m  05/05/23 31.62 kg/m       New complaints: None today  Allergies  Allergen Reactions   Norco [Hydrocodone -Acetaminophen ] Nausea Only   Outpatient Encounter Medications as of 07/29/2023  Medication Sig   acetaminophen -codeine  (TYLENOL  #3) 300-30 MG tablet Take 1 tablet by mouth 2 (two) times daily as needed for moderate pain (pain score 4-6).   anastrozole  (ARIMIDEX ) 1 MG tablet TAKE ONE TABLET DAILY   Ascorbic Acid (VITAMIN C) 500 MG tablet Take 500 mg by mouth daily.    aspirin 81 MG tablet Take 81 mg by mouth daily.     Biotin 10 MG CAPS Take by mouth.   Boswellia-Glucosamine-Vit D (OSTEO BI-FLEX ONE PER DAY PO) Take by mouth.   Cholecalciferol (VITAMIN D) 2000 UNITS tablet Take 2,000 Units by mouth 2 (two) times daily.   Cyanocobalamin  (VITAMIN B 12 PO) Take by mouth.   FLAXSEED, LINSEED, PO Take by mouth.   Glucosamine-Chondroitin (OSTEO BI-FLEX REGULAR STRENGTH PO) Take by mouth.   levothyroxine  (SYNTHROID ) 50 MCG tablet Take 1 tablet (50 mcg total) by mouth daily.   Multiple Vitamins-Minerals (  CENTRUM SILVER PO) Take by mouth.     pseudoephedrine (SUDAFED) 30 MG tablet Take 30 mg by mouth every 4 (four) hours as needed for congestion.   psyllium (REGULOID) 0.52 g capsule Take 0.52 g by mouth daily.   rosuvastatin  (CRESTOR ) 10 MG tablet Take 1 tablet (10 mg total) by mouth daily.   TURMERIC PO Take by mouth.   vitamin E 180 MG (400 UNITS) capsule Take 400 Units by mouth daily.   Facility-Administered Encounter Medications as of 07/29/2023  Medication   0.9 %  sodium chloride  infusion    Past Surgical History:  Procedure Laterality Date   BRAIN SURGERY     BREAST LUMPECTOMY WITH RADIOACTIVE SEED LOCALIZATION Right 02/26/2022    Procedure: RIGHT BREAST LUMPECTOMY WITH RADIOACTIVE SEED LOCALIZATION;  Surgeon: Enid Harry, MD;  Location: [facility];  Service: General;  Laterality: Right;   BREAST SURGERY  AUG. 2010   right breast lumpectomy   BREAST SURGERY     Cancer   COLONOSCOPY     CRANIOTOMY Left 10/16/2015   Procedure: PARA-SAGITTAL CRANIOTOMY WITH STEREOTACTIC NAVIGATION FOR TUMOR;  Surgeon: Raelene Bullocks Ditty, MD;  Location: [facility];  Service: Neurosurgery;  Laterality: Left;   GANGLION CYST EXCISION  SEP. 1997   RIGHT WRIST    Family History  Problem Relation Age of Onset   Colon polyps Mother    Colon cancer Mother 83   Diabetes Father    Heart disease Father    Cancer Father        skin   Colon polyps Sister    Leukemia Sister 52       CLL   Breast cancer Brother 43   Cancer Brother        bone mets   Cancer Brother        skin   Ovarian cancer Maternal Aunt        dx in her 30s   Tuberculosis Maternal Grandmother    Heart disease Maternal Grandfather    Diabetes Paternal Grandmother    Alcohol abuse Paternal Grandfather    Esophageal cancer Neg Hx    Rectal cancer Neg Hx    Stomach cancer Neg Hx       Controlled substance contract: n/a     Review of Systems  Constitutional:  Negative for diaphoresis.  Eyes:  Negative for pain.  Respiratory:  Negative for shortness of breath.   Cardiovascular:  Negative for chest pain, palpitations and leg swelling.  Gastrointestinal:  Negative for abdominal pain.  Endocrine: Negative for polydipsia.  Skin:  Negative for rash.  Neurological:  Negative for dizziness, weakness and headaches.  Hematological:  Does not bruise/bleed easily.  All other systems reviewed and are negative.      Objective:   Physical Exam Vitals and nursing note reviewed.  Constitutional:      General: She is not in acute distress.    Appearance: Normal appearance. She is well-developed.  HENT:     Head: Normocephalic.      Right Ear: Tympanic membrane normal.     Left Ear: Tympanic membrane normal.     Nose: Nose normal.     Mouth/Throat:     Mouth: Mucous membranes are moist.  Eyes:     Pupils: Pupils are equal, round, and reactive to light.  Neck:     Vascular: No carotid bruit or JVD.  Cardiovascular:     Rate and Rhythm: Normal rate and regular rhythm.  Heart sounds: Normal heart sounds.  Pulmonary:     Effort: Pulmonary effort is normal. No respiratory distress.     Breath sounds: Normal breath sounds. No wheezing or rales.  Chest:     Chest Woon: No tenderness.  Abdominal:     General: Bowel sounds are normal. There is no distension or abdominal bruit.     Palpations: Abdomen is soft. There is no hepatomegaly, splenomegaly, mass or pulsatile mass.     Tenderness: There is no abdominal tenderness.  Musculoskeletal:        General: Normal range of motion.     Cervical back: Normal range of motion and neck supple.  Lymphadenopathy:     Cervical: No cervical adenopathy.  Skin:    General: Skin is warm and dry.  Neurological:     Mental Status: She is alert and oriented to person, place, and time.     Deep Tendon Reflexes: Reflexes are normal and symmetric.  Psychiatric:        Behavior: Behavior normal.        Thought Content: Thought content normal.        Judgment: Judgment normal.    BP 120/74   Pulse 76   Temp (!) 97.2 F (36.2 C) (Temporal)   Ht 5\' 5"  (1.651 m)   Wt 190 lb (86.2 kg)   SpO2 96%   BMI 31.62 kg/m          Assessment & Plan:   Julia Poole comes in today with chief complaint of annual physical  Diagnosis and orders addressed:  1. Mixed hyperlipidemia Low fat diet - rosuvastatin  (CRESTOR ) 10 MG tablet; Take 1 tablet (10 mg total) by mouth daily.  Dispense: 90 tablet; Refill: 1 - CBC with Differential/Platelet - CMP14+EGFR - Lipid panel  2. Hypothyroidism due to acquired atrophy of thyroid  Labs pending - levothyroxine  (SYNTHROID ) 50 MCG tablet;  Take 1 tablet (50 mcg total) by mouth daily.  Dispense: 90 tablet; Refill: 1 - Thyroid  Panel With TSH  3. Atypical meningioma of brain (HCC) Report any dizziness headaches or blurred vision  4. BMI 31.0-31.9,adult Discussed diet and exercise for person with BMI >25 Will recheck weight in 3-6 months   5. Spondylolisthesis of lumbar region Moist heat No heavy lifting - acetaminophen -codeine  (TYLENOL  #3) 300-30 MG tablet; Take 1 tablet by mouth 2 (two) times daily as needed for moderate pain (pain score 4-6).  Dispense: 60 tablet; Refill: 2   Labs pending Health Maintenance reviewed- will do dexascan at next visit Diet and exercise encouraged  Follow up plan: 3 months   Mary-Margaret Gaylyn Keas, FNP

## 2023-07-30 LAB — CBC WITH DIFFERENTIAL/PLATELET
Basophils Absolute: 0 10*3/uL (ref 0.0–0.2)
Basos: 0 %
EOS (ABSOLUTE): 0.2 10*3/uL (ref 0.0–0.4)
Eos: 3 %
Hematocrit: 41.8 % (ref 34.0–46.6)
Hemoglobin: 14 g/dL (ref 11.1–15.9)
Immature Grans (Abs): 0 10*3/uL (ref 0.0–0.1)
Immature Granulocytes: 0 %
Lymphocytes Absolute: 2.6 10*3/uL (ref 0.7–3.1)
Lymphs: 38 %
MCH: 31.3 pg (ref 26.6–33.0)
MCHC: 33.5 g/dL (ref 31.5–35.7)
MCV: 93 fL (ref 79–97)
Monocytes Absolute: 0.5 10*3/uL (ref 0.1–0.9)
Monocytes: 7 %
Neutrophils Absolute: 3.5 10*3/uL (ref 1.4–7.0)
Neutrophils: 52 %
Platelets: 265 10*3/uL (ref 150–450)
RBC: 4.48 x10E6/uL (ref 3.77–5.28)
RDW: 12.8 % (ref 11.7–15.4)
WBC: 6.7 10*3/uL (ref 3.4–10.8)

## 2023-07-30 LAB — THYROID PANEL WITH TSH
Free Thyroxine Index: 1.9 (ref 1.2–4.9)
T3 Uptake Ratio: 25 % (ref 24–39)
T4, Total: 7.4 ug/dL (ref 4.5–12.0)
TSH: 2.26 u[IU]/mL (ref 0.450–4.500)

## 2023-07-30 LAB — LIPID PANEL
Chol/HDL Ratio: 3.4 ratio (ref 0.0–4.4)
Cholesterol, Total: 163 mg/dL (ref 100–199)
HDL: 48 mg/dL (ref >39)
LDL Chol Calc (NIH): 78 mg/dL (ref 0–99)
Triglycerides: 221 mg/dL — ABNORMAL HIGH (ref 0–149)
VLDL Cholesterol Cal: 37 mg/dL (ref 5–40)

## 2023-07-30 LAB — CMP14+EGFR
ALT: 22 IU/L (ref 0–32)
AST: 26 IU/L (ref 0–40)
Albumin: 4.5 g/dL (ref 3.9–4.9)
Alkaline Phosphatase: 112 IU/L (ref 44–121)
BUN/Creatinine Ratio: 28 (ref 12–28)
BUN: 15 mg/dL (ref 8–27)
Bilirubin Total: 0.8 mg/dL (ref 0.0–1.2)
CO2: 26 mmol/L (ref 20–29)
Calcium: 10.5 mg/dL — ABNORMAL HIGH (ref 8.7–10.3)
Chloride: 103 mmol/L (ref 96–106)
Creatinine, Ser: 0.53 mg/dL — ABNORMAL LOW (ref 0.57–1.00)
Globulin, Total: 2.1 g/dL (ref 1.5–4.5)
Glucose: 102 mg/dL — ABNORMAL HIGH (ref 70–99)
Potassium: 4.7 mmol/L (ref 3.5–5.2)
Sodium: 141 mmol/L (ref 134–144)
Total Protein: 6.6 g/dL (ref 6.0–8.5)
eGFR: 100 mL/min/{1.73_m2} (ref >59)

## 2023-08-01 LAB — TOXASSURE SELECT 13 (MW), URINE

## 2023-08-27 DIAGNOSIS — H2513 Age-related nuclear cataract, bilateral: Secondary | ICD-10-CM | POA: Diagnosis not present

## 2023-10-24 ENCOUNTER — Encounter: Payer: Self-pay | Admitting: Family Medicine

## 2023-10-24 ENCOUNTER — Ambulatory Visit: Payer: Self-pay

## 2023-10-24 ENCOUNTER — Ambulatory Visit: Admitting: Family Medicine

## 2023-10-24 VITALS — BP 111/71 | HR 89 | Temp 97.8°F | Ht 65.0 in | Wt 188.2 lb

## 2023-10-24 DIAGNOSIS — Z862 Personal history of diseases of the blood and blood-forming organs and certain disorders involving the immune mechanism: Secondary | ICD-10-CM | POA: Diagnosis not present

## 2023-10-24 DIAGNOSIS — J069 Acute upper respiratory infection, unspecified: Secondary | ICD-10-CM | POA: Diagnosis not present

## 2023-10-24 MED ORDER — PREDNISONE 20 MG PO TABS: 40.0000 mg | ORAL_TABLET | Freq: Every day | ORAL | 0 refills | Status: AC

## 2023-10-24 MED ORDER — ALBUTEROL SULFATE HFA 108 (90 BASE) MCG/ACT IN AERS: 2.0000 | INHALATION_SPRAY | Freq: Four times a day (QID) | RESPIRATORY_TRACT | 0 refills | Status: AC | PRN

## 2023-10-24 MED ORDER — AMOXICILLIN-POT CLAVULANATE 875-125 MG PO TABS: 1.0000 | ORAL_TABLET | Freq: Two times a day (BID) | ORAL | 0 refills | Status: DC

## 2023-10-24 NOTE — Telephone Encounter (Signed)
Noted  -LS

## 2023-10-24 NOTE — Telephone Encounter (Signed)
 FYI Only or Action Required?: Action required by provider: request for appointment.  Patient was last seen in primary care on 07/29/2023 by Gladis Mustard, FNP.  Called Nurse Triage reporting Cough.  Symptoms began several days ago.  Interventions attempted: OTC medications: Mucinex .  Symptoms are: gradually worsening. Non-productive cough, ear pain, sore throat. Not sleeping well due to cough.  Triage Disposition: See Physician Within 24 Hours  Patient/caregiver understands and will follow disposition?: Yes   Copied from CRM #[id number]. Topic: Clinical - Red Word Triage >> Oct 24, 2023  7:44 AM Willma R wrote: Red Word that prompted transfer to Nurse Triage: Patient states she thinks she has a cold, She has a bad cough, ear and throat pain. Symptoms since Tuesday. Reason for Disposition  [1] Continuous (nonstop) coughing interferes with work or school AND [2] no improvement using cough treatment per Care Advice  Answer Assessment - Initial Assessment Questions 1. ONSET: When did the cough begin?      Tuesday 2. SEVERITY: How bad is the cough today?      severe 3. SPUTUM: Describe the color of your sputum (e.g., none, dry cough; clear, white, yellow, green)     none 4. HEMOPTYSIS: Are you coughing up any blood? If Yes, ask: How much? (e.g., flecks, streaks, tablespoons, etc.)     no 5. DIFFICULTY BREATHING: Are you having difficulty breathing? If Yes, ask: How bad is it? (e.g., mild, moderate, severe)      no 6. FEVER: Do you have a fever? If Yes, ask: What is your temperature, how was it measured, and when did it start?     no 7. CARDIAC HISTORY: Do you have any history of heart disease? (e.g., heart attack, congestive heart failure)      no 8. LUNG HISTORY: Do you have any history of lung disease?  (e.g., pulmonary embolus, asthma, emphysema)     no 9. PE RISK FACTORS: Do you have a history of blood clots? (or: recent major surgery, recent  prolonged travel, bedridden)     no 10. OTHER SYMPTOMS: Do you have any other symptoms? (e.g., runny nose, wheezing, chest pain)       Ear pain, sore throat 11. PREGNANCY: Is there any chance you are pregnant? When was your last menstrual period?       no 12. TRAVEL: Have you traveled out of the country in the last month? (e.g., travel history, exposures)       no  Protocols used: Cough - Acute Non-Productive-A-AH

## 2023-10-24 NOTE — Progress Notes (Signed)
 Subjective:  Patient ID: Julia Poole, female    DOB: 22-Jun-1955, 68 y.o.   MRN: [id number]  Patient Care Team: Gladis Mustard, FNP as PCP - General (Nurse Practitioner) Debrah Toribio Jude, MD as Referring Physician (Internal Medicine) Lanis Pupa, MD as Consulting Physician (Neurosurgery) Odean Potts, MD as Consulting Physician (Hematology and Oncology) [facility], [facility] Rox Charleston, MD as Consulting Physician (Obstetrics and Gynecology) Ebbie Cough, MD as Consulting Physician (General Surgery) Crawford, Morna Pickle, NP as Nurse Practitioner (Hematology and Oncology)   Chief Complaint:  Cough, Nasal Congestion, and Sore Throat (X 3 days - otc mucinex  & sudafed )   HPI: Julia Poole is a 68 y.o. female presenting on 10/24/2023 for Cough, Nasal Congestion, and Sore Throat (X 3 days - otc mucinex  & sudafed )  Julia Poole is a 68 year old female who presents with symptoms of a respiratory infection.  Earlier in the week, she experienced symptoms resembling a cold, which progressed to increased nasal congestion and a sore throat. These symptoms have since moved into her chest.  She had a fever a couple of days ago, which has since resolved. She has been managing her symptoms at home with Mucinex  and Sudafed. Sudafed did not alleviate her symptoms as quickly as she hoped, leading her to switch to Mucinex . Despite this, she has not yet started coughing up any mucus.  She has been trying to stay hydrated by drinking water and orange juice. She reports significant coughing, which has caused soreness in her diaphragm area. She has been using saline to help with her symptoms, which she finds beneficial.  Her current medications include anastrozole  (Arimidex ) for immunotherapy, which she takes as part of her treatment regimen. She is allergic to hydrocodone .          Relevant past medical, surgical, family, and social history reviewed and updated as  indicated.  Allergies and medications reviewed and updated. Data reviewed: Chart in Epic.   Past Medical History:  Diagnosis Date   Allergy    Arthritis    Bleeding nose    Cancer (HCC) 2010   right breast   Cancer (HCC) 09/2015   brain   Cataract    Degenerative disk disease 2012   Family history of breast cancer in female    Family history of colon cancer    Family history of ovarian cancer    Hemorrhoids    Hyperlipidemia    Sinus problem    Thyroid  disease     Past Surgical History:  Procedure Laterality Date   BRAIN SURGERY     BREAST LUMPECTOMY WITH RADIOACTIVE SEED LOCALIZATION Right 02/26/2022   Procedure: RIGHT BREAST LUMPECTOMY WITH RADIOACTIVE SEED LOCALIZATION;  Surgeon: Ebbie Cough, MD;  Location: [facility];  Service: General;  Laterality: Right;   BREAST SURGERY  AUG. 2010   right breast lumpectomy   BREAST SURGERY     Cancer   COLONOSCOPY     CRANIOTOMY Left 10/16/2015   Procedure: PARA-SAGITTAL CRANIOTOMY WITH STEREOTACTIC NAVIGATION FOR TUMOR;  Surgeon: Morene Hicks Ditty, MD;  Location: [facility];  Service: Neurosurgery;  Laterality: Left;   GANGLION CYST EXCISION  SEP. 1997   RIGHT WRIST    Social History   Socioeconomic History   Marital status: Married    Spouse name: Lynwood   Number of children: 1   Years of education: 12   Highest education level: 12th grade  Occupational History   Occupation: Engineer, structural[employer]  Tobacco Use   Smoking status: Never   Smokeless tobacco: Never  Vaping Use   Vaping status: Never Used  Substance and Sexual Activity   Alcohol use: No   Drug use: No   Sexual activity: Yes    Birth control/protection: Post-menopausal  Other Topics Concern   Not on file  Social History Narrative   Married 27 years on 01/18/2021.   1 son and 2 step children, son and daughter.   Social Drivers of Corporate investment banker Strain: Low Risk  (05/05/2023)   Overall Financial Resource Strain (CARDIA)     Difficulty of Paying Living Expenses: Not hard at all  Food Insecurity: No Food Insecurity (05/05/2023)   Hunger Vital Sign    Worried About Running Out of Food in the Last Year: Never true    Ran Out of Food in the Last Year: Never true  Transportation Needs: No Transportation Needs (05/05/2023)   PRAPARE - Administrator, Civil Service (Medical): No    Lack of Transportation (Non-Medical): No  Physical Activity: Sufficiently Active (05/05/2023)   Exercise Vital Sign    Days of Exercise per Week: 5 days    Minutes of Exercise per Session: 30 min  Stress: No Stress Concern Present (05/05/2023)   [employer] of Occupational Health - Occupational Stress Questionnaire    Feeling of Stress : Not at all  Social Connections: Socially Integrated (05/05/2023)   Social Connection and Isolation Panel    Frequency of Communication with Friends and Family: More than three times a week    Frequency of Social Gatherings with Friends and Family: Three times a week    Attends Religious Services: More than 4 times per year    Active Member of Clubs or Organizations: Yes    Attends Banker Meetings: More than 4 times per year    Marital Status: Married  Catering manager Violence: Not At Risk (05/05/2023)   Humiliation, Afraid, Rape, and Kick questionnaire    Fear of Current or Ex-Partner: No    Emotionally Abused: No    Physically Abused: No    Sexually Abused: No    Outpatient Encounter Medications as of 10/24/2023  Medication Sig   acetaminophen -codeine  (TYLENOL  #3) 300-30 MG tablet Take 1 tablet by mouth 2 (two) times daily as needed for moderate pain (pain score 4-6).   albuterol  (VENTOLIN  HFA) 108 (90 Base) MCG/ACT inhaler Inhale 2 puffs into the lungs every 6 (six) hours as needed for wheezing or shortness of breath.   amoxicillin -clavulanate (AUGMENTIN ) 875-125 MG tablet Take 1 tablet by mouth 2 (two) times daily for 7 days.   anastrozole  (ARIMIDEX ) 1 MG tablet TAKE ONE  TABLET DAILY   Ascorbic Acid (VITAMIN C) 500 MG tablet Take 500 mg by mouth daily.    aspirin 81 MG tablet Take 81 mg by mouth daily.     Biotin 10 MG CAPS Take by mouth.   Boswellia-Glucosamine-Vit D (OSTEO BI-FLEX ONE PER DAY PO) Take by mouth.   Cholecalciferol (VITAMIN D) 2000 UNITS tablet Take 2,000 Units by mouth 2 (two) times daily.   Cyanocobalamin  (VITAMIN B 12 PO) Take by mouth.   FLAXSEED, LINSEED, PO Take by mouth.   levothyroxine  (SYNTHROID ) 50 MCG tablet Take 1 tablet (50 mcg total) by mouth daily.   Multiple Vitamins-Minerals (CENTRUM SILVER PO) Take by mouth.     predniSONE  (DELTASONE ) 20 MG tablet Take 2 tablets (40 mg total) by mouth daily with breakfast  for 5 days.   pseudoephedrine (SUDAFED) 30 MG tablet Take 30 mg by mouth every 4 (four) hours as needed for congestion.   psyllium (REGULOID) 0.52 g capsule Take 0.52 g by mouth daily.   rosuvastatin  (CRESTOR ) 10 MG tablet Take 1 tablet (10 mg total) by mouth daily.   TURMERIC PO Take by mouth.   vitamin E 180 MG (400 UNITS) capsule Take 400 Units by mouth daily.   Facility-Administered Encounter Medications as of 10/24/2023  Medication   0.9 %  sodium chloride  infusion    Allergies  Allergen Reactions   Norco [Hydrocodone -Acetaminophen ] Nausea Only    Pertinent ROS per HPI, otherwise unremarkable      Objective:  BP 111/71   Pulse 89   Temp 97.8 F (36.6 C)   Ht 5' 5 (1.651 m)   Wt 188 lb 3.2 oz (85.4 kg)   SpO2 98%   BMI 31.32 kg/m    Wt Readings from Last 3 Encounters:  10/24/23 188 lb 3.2 oz (85.4 kg)  07/29/23 190 lb (86.2 kg)  07/14/23 191 lb 8 oz (86.9 kg)    Physical Exam Vitals and nursing note reviewed.  Constitutional:      General: She is not in acute distress.    Appearance: She is well-developed. She is obese. She is not ill-appearing, toxic-appearing or diaphoretic.  HENT:     Head: Normocephalic and atraumatic.     Right Ear: A middle ear effusion is present. Tympanic membrane  is not erythematous.     Left Ear: A middle ear effusion is present. Tympanic membrane is not erythematous.     Nose: Congestion present. No rhinorrhea.     Mouth/Throat:     Mouth: Mucous membranes are moist. No oral lesions.     Pharynx: Posterior oropharyngeal erythema present. No pharyngeal swelling, oropharyngeal exudate or uvula swelling.  Eyes:     Conjunctiva/sclera: Conjunctivae normal.     Pupils: Pupils are equal, round, and reactive to light.  Cardiovascular:     Rate and Rhythm: Normal rate and regular rhythm.     Heart sounds: Normal heart sounds.  Pulmonary:     Effort: Pulmonary effort is normal.     Breath sounds: Normal breath sounds.  Musculoskeletal:     Cervical back: Neck supple.     Right lower leg: No edema.     Left lower leg: No edema.  Lymphadenopathy:     Cervical: Cervical adenopathy present.  Skin:    General: Skin is warm and dry.     Capillary Refill: Capillary refill takes less than 2 seconds.  Neurological:     General: No focal deficit present.     Mental Status: She is alert and oriented to person, place, and time.  Psychiatric:        Mood and Affect: Mood normal.        Behavior: Behavior normal.     Results for orders placed or performed in visit on 07/29/23  ToxASSURE Select 13 (MW), Urine   Collection Time: 07/29/23 12:04 PM  Result Value Ref Range   Summary FINAL   CBC with Differential/Platelet   Collection Time: 07/29/23 12:13 PM  Result Value Ref Range   WBC 6.7 3.4 - 10.8 x10E3/uL   RBC 4.48 3.77 - 5.28 x10E6/uL   Hemoglobin 14.0 11.1 - 15.9 g/dL   Hematocrit 58.1 65.9 - 46.6 %   MCV 93 79 - 97 fL   MCH 31.3 26.6 - 33.0 pg  MCHC 33.5 31.5 - 35.7 g/dL   RDW 87.1 88.2 - 84.5 %   Platelets 265 150 - 450 x10E3/uL   Neutrophils 52 Not Estab. %   Lymphs 38 Not Estab. %   Monocytes 7 Not Estab. %   Eos 3 Not Estab. %   Basos 0 Not Estab. %   Neutrophils Absolute 3.5 1.4 - 7.0 x10E3/uL   Lymphocytes Absolute 2.6 0.7 - 3.1  x10E3/uL   Monocytes Absolute 0.5 0.1 - 0.9 x10E3/uL   EOS (ABSOLUTE) 0.2 0.0 - 0.4 x10E3/uL   Basophils Absolute 0.0 0.0 - 0.2 x10E3/uL   Immature Granulocytes 0 Not Estab. %   Immature Grans (Abs) 0.0 0.0 - 0.1 x10E3/uL  CMP14+EGFR   Collection Time: 07/29/23 12:13 PM  Result Value Ref Range   Glucose 102 (H) 70 - 99 mg/dL   BUN 15 8 - 27 mg/dL   Creatinine, Ser 9.46 (L) 0.57 - 1.00 mg/dL   eGFR 899 >40 fO/fpw/8.26   BUN/Creatinine Ratio 28 12 - 28   Sodium 141 134 - 144 mmol/L   Potassium 4.7 3.5 - 5.2 mmol/L   Chloride 103 96 - 106 mmol/L   CO2 26 20 - 29 mmol/L   Calcium  10.5 (H) 8.7 - 10.3 mg/dL   Total Protein 6.6 6.0 - 8.5 g/dL   Albumin  4.5 3.9 - 4.9 g/dL   Globulin, Total 2.1 1.5 - 4.5 g/dL   Bilirubin Total 0.8 0.0 - 1.2 mg/dL   Alkaline Phosphatase 112 44 - 121 IU/L   AST 26 0 - 40 IU/L   ALT 22 0 - 32 IU/L  Lipid panel   Collection Time: 07/29/23 12:13 PM  Result Value Ref Range   Cholesterol, Total 163 100 - 199 mg/dL   Triglycerides 778 (H) 0 - 149 mg/dL   HDL 48 >60 mg/dL   VLDL Cholesterol Cal 37 5 - 40 mg/dL   LDL Chol Calc (NIH) 78 0 - 99 mg/dL   Chol/HDL Ratio 3.4 0.0 - 4.4 ratio  Thyroid  Panel With TSH   Collection Time: 07/29/23 12:13 PM  Result Value Ref Range   TSH 2.260 0.450 - 4.500 uIU/mL   T4, Total 7.4 4.5 - 12.0 ug/dL   T3 Uptake Ratio 25 24 - 39 %   Free Thyroxine Index 1.9 1.2 - 4.9       Pertinent labs & imaging results that were available during my care of the patient were reviewed by me and considered in my medical decision making.  Assessment & Plan:  Julia Poole was seen today for cough, nasal congestion and sore throat.  Diagnoses and all orders for this visit:  URI with cough and congestion -     albuterol  (VENTOLIN  HFA) 108 (90 Base) MCG/ACT inhaler; Inhale 2 puffs into the lungs every 6 (six) hours as needed for wheezing or shortness of breath. -     amoxicillin -clavulanate (AUGMENTIN ) 875-125 MG tablet; Take 1 tablet by mouth  2 (two) times daily for 7 days. -     predniSONE  (DELTASONE ) 20 MG tablet; Take 2 tablets (40 mg total) by mouth daily with breakfast for 5 days.  History of immunocompromised state -     albuterol  (VENTOLIN  HFA) 108 (90 Base) MCG/ACT inhaler; Inhale 2 puffs into the lungs every 6 (six) hours as needed for wheezing or shortness of breath. -     amoxicillin -clavulanate (AUGMENTIN ) 875-125 MG tablet; Take 1 tablet by mouth 2 (two) times daily for 7 days. -  predniSONE  (DELTASONE ) 20 MG tablet; Take 2 tablets (40 mg total) by mouth daily with breakfast for 5 days.     Upper respiratory tract infection Symptoms consistent with an upper respiratory tract infection, including sore throat, nasal congestion, and cough, began earlier in the week and have progressed to chest discomfort. Fever resolved. Limited relief from Mucinex  and Sudafed. Concern for progression to lower respiratory tract infection due to immunotherapy with anastrozole . Lungs clear, indicating no current lower respiratory involvement. Decision to prescribe antibiotics and supportive treatments to prevent progression. - Prescribe Augmentin  875 mg twice daily for 7 days. - Prescribe albuterol  inhaler for use every 6 to 8 hours as needed for cough. - Prescribe prednisone  to reduce congestion and cough. - Advise to maintain hydration to aid symptom relief. - Instruct to report any worsening or new symptoms.  Immunotherapy with anastrozole  On anastrozole  (Arimidex ) as part of immunotherapy regimen, with follow-up every six months. This therapy may impact immune response, necessitating prompt treatment of respiratory infections to prevent complications. - Continue immunotherapy regimen with anastrozole . - Maintain follow-up appointments for immunotherapy.       Continue all other maintenance medications.  Follow up plan: Return if symptoms worsen or fail to improve.   Continue healthy lifestyle choices, including diet (rich in  fruits, vegetables, and lean proteins, and low in salt and simple carbohydrates) and exercise (at least 30 minutes of moderate physical activity daily).  Educational handout given for URI  The above assessment and management plan was discussed with the patient. The patient verbalized understanding of and has agreed to the management plan. Patient is aware to call the clinic if they develop any new symptoms or if symptoms persist or worsen. Patient is aware when to return to the clinic for a follow-up visit. Patient educated on when it is appropriate to go to the emergency department.   Rosaline Bruns, FNP-C [facility] [phone]

## 2023-10-27 ENCOUNTER — Ambulatory Visit: Admitting: Nurse Practitioner

## 2023-10-30 NOTE — Progress Notes (Unsigned)
 Subjective:    Patient ID: Julia Poole, female    DOB: [birth year], 68 y.o.   MRN: [id number]    Chief Complaint: pain management  HPI Pain assessment: Cause of pain- spondylolisesis Pain location- lower back Pain on scale of 1-10- 4/10 today Frequency- daily What increases pain-to much activity What makes pain Better-rest helps Effects on ADL - none Any change in general medical condition-no  Current opioids rx- tylenol  #3 daily # meds rx- 30 Effectiveness of current meds-helps Adverse reactions from pain meds-none Morphine equivalent-  Pill count performed-No Last drug screen - 07/29/23 ( high risk q90m, moderate risk q21m, low risk yearly ) Urine drug screen today- No Was the [facility] reviewed- yes  If yes were their any concerning findings? - no   Overdose risk: 1    10/14/2018   10:25 AM  Opioid Risk   Alcohol 0   Illegal Drugs 0  Rx Drugs 0  Alcohol 0  Illegal Drugs 0  Rx Drugs 0  Age between 16-45 years  0  History of Preadolescent Sexual Abuse 0  Psychological Disease 0  Depression 0  Opioid Risk Tool Scoring 0  Opioid Risk Interpretation Low Risk     Data saved with a previous flowsheet row definition     Pain contract signed on:07/31/23  Patient Active Problem List   Diagnosis Date Noted   Ductal carcinoma in situ (DCIS) of right breast 04/04/2022   Genetic testing 01/18/2016   Family history of breast cancer in female    Family history of colon cancer    Family history of ovarian cancer    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  Eyes:  Negative for pain.  Respiratory:  Negative for shortness of breath.   Cardiovascular:  Negative for chest pain, palpitations and leg swelling.   Gastrointestinal:  Negative for abdominal pain.  Endocrine: Negative for polydipsia.  Skin:  Negative for rash.  Neurological:  Negative for dizziness, weakness and headaches.  Hematological:  Does not bruise/bleed easily.  All other systems reviewed and are negative.      Objective:   Physical Exam Constitutional:      Appearance: Normal appearance.  Cardiovascular:     Rate and Rhythm: Normal rate and regular rhythm.     Heart sounds: Normal heart sounds.  Pulmonary:     Breath sounds: Normal breath sounds.  Skin:    General: Skin is warm.  Neurological:     General: No focal deficit present.     Mental Status: She is alert and oriented to person, place, and time.  Psychiatric:        Mood and Affect: Mood normal.        Behavior: Behavior normal.    There were no vitals taken for this visit.         Assessment & Plan:   Julia Poole comes in today with chief complaint of No chief complaint on file.   Diagnosis and orders addressed:  1. Spondylolisthesis of lumbar region Moist heat rest - acetaminophen -codeine  (TYLENOL  #3) 300-30 MG tablet; Take 1 tablet by mouth every 4 (four) hours as needed for moderate pain.  Dispense: 30 tablet; Refill: 2   Follow up plan: 3 months   Mary-Margaret  Gladis, FNP

## 2023-10-31 ENCOUNTER — Ambulatory Visit: Admitting: Nurse Practitioner

## 2023-10-31 ENCOUNTER — Encounter: Payer: Self-pay | Admitting: Nurse Practitioner

## 2023-10-31 DIAGNOSIS — M4316 Spondylolisthesis, lumbar region: Secondary | ICD-10-CM

## 2023-10-31 MED ORDER — ACETAMINOPHEN-CODEINE 300-30 MG PO TABS: 1.0000 | ORAL_TABLET | Freq: Two times a day (BID) | ORAL | 2 refills | Status: DC | PRN

## 2024-01-29 ENCOUNTER — Other Ambulatory Visit

## 2024-01-29 ENCOUNTER — Ambulatory Visit (INDEPENDENT_AMBULATORY_CARE_PROVIDER_SITE_OTHER): Admitting: Nurse Practitioner

## 2024-01-29 ENCOUNTER — Encounter: Payer: Self-pay | Admitting: Nurse Practitioner

## 2024-01-29 VITALS — BP 127/69 | HR 77 | Temp 97.8°F | Ht 65.0 in | Wt 186.0 lb

## 2024-01-29 DIAGNOSIS — D420 Neoplasm of uncertain behavior of cerebral meninges: Secondary | ICD-10-CM | POA: Diagnosis not present

## 2024-01-29 DIAGNOSIS — E782 Mixed hyperlipidemia: Secondary | ICD-10-CM | POA: Diagnosis not present

## 2024-01-29 DIAGNOSIS — Z6831 Body mass index (BMI) 31.0-31.9, adult: Secondary | ICD-10-CM

## 2024-01-29 DIAGNOSIS — Z23 Encounter for immunization: Secondary | ICD-10-CM

## 2024-01-29 DIAGNOSIS — E034 Atrophy of thyroid (acquired): Secondary | ICD-10-CM

## 2024-01-29 DIAGNOSIS — M4316 Spondylolisthesis, lumbar region: Secondary | ICD-10-CM

## 2024-01-29 LAB — LIPID PANEL

## 2024-01-29 MED ORDER — ROSUVASTATIN CALCIUM 10 MG PO TABS: 10.0000 mg | ORAL_TABLET | Freq: Every day | ORAL | 1 refills | Status: AC

## 2024-01-29 MED ORDER — ACETAMINOPHEN-CODEINE 300-30 MG PO TABS
1.0000 | ORAL_TABLET | Freq: Two times a day (BID) | ORAL | 2 refills | Status: AC | PRN
Start: 2024-01-29 — End: ?

## 2024-01-29 MED ORDER — LEVOTHYROXINE SODIUM 50 MCG PO TABS: 50.0000 ug | ORAL_TABLET | Freq: Every day | ORAL | 1 refills | Status: AC

## 2024-01-29 NOTE — Progress Notes (Signed)
 Subjective:    Patient ID: Julia Poole, female    DOB: 01-19-1956, 68 y.o.   MRN: [id number]   Chief Complaint: medical management of chronic issues     HPI:  Julia Poole is a 68 y.o. who identifies as a female who was assigned female at birth.   Social history: Lives with: husband Work history: retired   Water Engineer in today for follow up of the following chronic medical issues:  1. Mixed hyperlipidemia Does not watch diet very closely and does no exercise. Lab Results  Component Value Date   CHOL 163 07/29/2023   HDL 48 07/29/2023   LDLCALC 78 07/29/2023   TRIG 221 (H) 07/29/2023   CHOLHDL 3.4 07/29/2023   The 10-year ASCVD risk score (Arnett DK, et al., 2019) is: 9%   2. Hypothyroidism due to acquired atrophy of thyroid  No issues that she is aware of Lab Results  Component Value Date   TSH 2.260 07/29/2023     3. Atypical meningioma of brain (HCC) No recent occurrence. Denies headaches, dizziness of blurred vision.  4. Spondylolisthesis Pain assessment: Cause of pain- spondylolisthesis Pain location- low back pain Pain on scale of 1-10-5-6/10 Frequency- daily What increases pain-to much activity What makes pain Better-rest helps Effects on ADL - none Any change in general medical condition-none  Current opioids rx- tylenol  #3 bid prn # meds rx- 60 Effectiveness of current meds-helsp Adverse reactions from pain meds-none Morphine equivalent- 27 MME  Pill count performed-Yes Last drug screen - 07/30/22 ( high risk q47m, moderate risk q33m, low risk yearly ) Urine drug screen today- No Was the [facility] reviewed- yes  If yes were their any concerning findings? - no   Overdose risk: 1    10/14/2018   10:25 AM  Opioid Risk   Alcohol 0   Illegal Drugs 0  Rx Drugs 0  Alcohol 0  Illegal Drugs 0  Rx Drugs 0  Age between 16-45 years  0  History of Preadolescent Sexual Abuse 0  Psychological Disease 0  Depression 0  Opioid Risk Tool Scoring 0   Opioid Risk Interpretation Low Risk     Data saved with a previous flowsheet row definition     Pain contract signed on:08/08/22    5. BMI 31.0-31.9,adult Weight is up 8lbs  Wt Readings from Last 3 Encounters:  10/31/23 186 lb (84.4 kg)  10/24/23 188 lb 3.2 oz (85.4 kg)  07/29/23 190 lb (86.2 kg)   BMI Readings from Last 3 Encounters:  10/31/23 30.95 kg/m  10/24/23 31.32 kg/m  07/29/23 31.62 kg/m       New complaints: None today  Allergies  Allergen Reactions   Norco [Hydrocodone -Acetaminophen ] Nausea Only   Outpatient Encounter Medications as of 01/29/2024  Medication Sig   acetaminophen -codeine  (TYLENOL  #3) 300-30 MG tablet Take 1 tablet by mouth 2 (two) times daily as needed for moderate pain (pain score 4-6).   albuterol  (VENTOLIN  HFA) 108 (90 Base) MCG/ACT inhaler Inhale 2 puffs into the lungs every 6 (six) hours as needed for wheezing or shortness of breath.   anastrozole  (ARIMIDEX ) 1 MG tablet TAKE ONE TABLET DAILY   Ascorbic Acid (VITAMIN C) 500 MG tablet Take 500 mg by mouth daily.    aspirin 81 MG tablet Take 81 mg by mouth daily.     Biotin 10 MG CAPS Take by mouth.   Boswellia-Glucosamine-Vit D (OSTEO BI-FLEX ONE PER DAY PO) Take by mouth.   Cholecalciferol (VITAMIN D) 2000 UNITS tablet  Take 2,000 Units by mouth 2 (two) times daily.   Cyanocobalamin  (VITAMIN B 12 PO) Take by mouth.   FLAXSEED, LINSEED, PO Take by mouth.   levothyroxine  (SYNTHROID ) 50 MCG tablet Take 1 tablet (50 mcg total) by mouth daily.   Multiple Vitamins-Minerals (CENTRUM SILVER PO) Take by mouth.     pseudoephedrine (SUDAFED) 30 MG tablet Take 30 mg by mouth every 4 (four) hours as needed for congestion.   psyllium (REGULOID) 0.52 g capsule Take 0.52 g by mouth daily.   rosuvastatin  (CRESTOR ) 10 MG tablet Take 1 tablet (10 mg total) by mouth daily.   TURMERIC PO Take by mouth.   vitamin E 180 MG (400 UNITS) capsule Take 400 Units by mouth daily.   Facility-Administered  Encounter Medications as of 01/29/2024  Medication   0.9 %  sodium chloride  infusion    Past Surgical History:  Procedure Laterality Date   BRAIN SURGERY     BREAST LUMPECTOMY WITH RADIOACTIVE SEED LOCALIZATION Right 02/26/2022   Procedure: RIGHT BREAST LUMPECTOMY WITH RADIOACTIVE SEED LOCALIZATION;  Surgeon: Ebbie Cough, MD;  Location: [facility];  Service: General;  Laterality: Right;   BREAST SURGERY  AUG. 2010   right breast lumpectomy   BREAST SURGERY     Cancer   COLONOSCOPY     CRANIOTOMY Left 10/16/2015   Procedure: PARA-SAGITTAL CRANIOTOMY WITH STEREOTACTIC NAVIGATION FOR TUMOR;  Surgeon: Morene Hicks Ditty, MD;  Location: [facility];  Service: Neurosurgery;  Laterality: Left;   GANGLION CYST EXCISION  SEP. 1997   RIGHT WRIST    Family History  Problem Relation Age of Onset   Colon polyps Mother    Colon cancer Mother 22   Diabetes Father    Heart disease Father    Cancer Father        skin   Colon polyps Sister    Leukemia Sister 20       CLL   Breast cancer Brother 57   Cancer Brother        bone mets   Cancer Brother        skin   Ovarian cancer Maternal Aunt        dx in her 30s   Tuberculosis Maternal Grandmother    Heart disease Maternal Grandfather    Diabetes Paternal Grandmother    Alcohol abuse Paternal Grandfather    Esophageal cancer Neg Hx    Rectal cancer Neg Hx    Stomach cancer Neg Hx       Controlled substance contract: n/a     Review of Systems  Constitutional:  Negative for diaphoresis.  Eyes:  Negative for pain.  Respiratory:  Negative for shortness of breath.   Cardiovascular:  Negative for chest pain, palpitations and leg swelling.  Gastrointestinal:  Negative for abdominal pain.  Endocrine: Negative for polydipsia.  Skin:  Negative for rash.  Neurological:  Negative for dizziness, weakness and headaches.  Hematological:  Does not bruise/bleed easily.  All other systems reviewed and are  negative.      Objective:   Physical Exam Vitals and nursing note reviewed.  Constitutional:      General: She is not in acute distress.    Appearance: Normal appearance. She is well-developed.  HENT:     Head: Normocephalic.     Right Ear: Tympanic membrane normal.     Left Ear: Tympanic membrane normal.     Nose: Nose normal.     Mouth/Throat:     Mouth: Mucous  membranes are moist.  Eyes:     Pupils: Pupils are equal, round, and reactive to light.  Neck:     Vascular: No carotid bruit or JVD.  Cardiovascular:     Rate and Rhythm: Normal rate and regular rhythm.     Heart sounds: Normal heart sounds.  Pulmonary:     Effort: Pulmonary effort is normal. No respiratory distress.     Breath sounds: Normal breath sounds. No wheezing or rales.  Chest:     Chest Diegel: No tenderness.  Abdominal:     General: Bowel sounds are normal. There is no distension or abdominal bruit.     Palpations: Abdomen is soft. There is no hepatomegaly, splenomegaly, mass or pulsatile mass.     Tenderness: There is no abdominal tenderness.  Musculoskeletal:        General: Normal range of motion.     Cervical back: Normal range of motion and neck supple.  Lymphadenopathy:     Cervical: No cervical adenopathy.  Skin:    General: Skin is warm and dry.  Neurological:     Mental Status: She is alert and oriented to person, place, and time.     Deep Tendon Reflexes: Reflexes are normal and symmetric.  Psychiatric:        Behavior: Behavior normal.        Thought Content: Thought content normal.        Judgment: Judgment normal.    BP 127/69   Pulse 77   Temp 97.8 F (36.6 C) (Temporal)   Ht 5' 5 (1.651 m)   Wt 186 lb (84.4 kg)   SpO2 96%   BMI 30.95 kg/m           Assessment & Plan:   Rian Koon Lipke comes in today with chief complaint of medical management of chronic issues    Diagnosis and orders addressed:  1. Mixed hyperlipidemia Low fat diet - rosuvastatin  (CRESTOR ) 10  MG tablet; Take 1 tablet (10 mg total) by mouth daily.  Dispense: 90 tablet; Refill: 1 - CBC with Differential/Platelet - CMP14+EGFR - Lipid panel  2. Hypothyroidism due to acquired atrophy of thyroid  Labs pending - levothyroxine  (SYNTHROID ) 50 MCG tablet; Take 1 tablet (50 mcg total) by mouth daily.  Dispense: 90 tablet; Refill: 1 - Thyroid  Panel With TSH  3. Atypical meningioma of brain (HCC) Report any dizziness headaches or blurred vision  4. BMI 31.0-31.9,adult Discussed diet and exercise for person with BMI >25 Will recheck weight in 3-6 months   5. Spondylolisthesis of lumbar region Moist heat No heavy lifting - acetaminophen -codeine  (TYLENOL  #3) 300-30 MG tablet; Take 1 tablet by mouth 2 (two) times daily as needed for moderate pain (pain score 4-6).  Dispense: 60 tablet; Refill: 2   Labs pending Health Maintenance reviewed- will do dexascan at next visit Diet and exercise encouraged  Follow up plan: 3 months   Mary-Margaret Gladis, FNP

## 2024-01-29 NOTE — Patient Instructions (Signed)
 Fall Prevention in the Home, Adult Falls can cause injuries and can happen to people of all ages. There are many things you can do to make your home safer and to help prevent falls. What actions can I take to prevent falls? General information Use good lighting in all rooms. Make sure to: Replace any light bulbs that burn out. Turn on the lights in dark areas and use night-lights. Keep items that you use often in easy-to-reach places. Lower the shelves around your home if needed. Move furniture so that there are clear paths around it. Do not use throw rugs or other things on the floor that can make you trip. If any of your floors are uneven, fix them. Add color or contrast paint or tape to clearly mark and help you see: Grab bars or handrails. First and last steps of staircases. Where the edge of each step is. If you use a ladder or stepladder: Make sure that it is fully opened. Do not climb a closed ladder. Make sure the sides of the ladder are locked in place. Have someone hold the ladder while you use it. Know where your pets are as you move through your home. What can I do in the bathroom?     Keep the floor dry. Clean up any water on the floor right away. Remove soap buildup in the bathtub or shower. Buildup makes bathtubs and showers slippery. Use non-skid mats or decals on the floor of the bathtub or shower. Attach bath mats securely with double-sided, non-slip rug tape. If you need to sit down in the shower, use a non-slip stool. Install grab bars by the toilet and in the bathtub and shower. Do not use towel bars as grab bars. What can I do in the bedroom? Make sure that you have a light by your bed that is easy to reach. Do not use any sheets or blankets on your bed that hang to the floor. Have a firm chair or bench with side arms that you can use for support when you get dressed. What can I do in the kitchen? Clean up any spills right away. If you need to reach something  above you, use a step stool with a grab bar. Keep electrical cords out of the way. Do not use floor polish or wax that makes floors slippery. What can I do with my stairs? Do not leave anything on the stairs. Make sure that you have a light switch at the top and the bottom of the stairs. Make sure that there are handrails on both sides of the stairs. Fix handrails that are broken or loose. Install non-slip stair treads on all your stairs if they do not have carpet. Avoid having throw rugs at the top or bottom of the stairs. Choose a carpet that does not hide the edge of the steps on the stairs. Make sure that the carpet is firmly attached to the stairs. Fix carpet that is loose or worn. What can I do on the outside of my home? Use bright outdoor lighting. Fix the edges of walkways and driveways and fix any cracks. Clear paths of anything that can make you trip, such as tools or rocks. Add color or contrast paint or tape to clearly mark and help you see anything that might make you trip as you walk through a door, such as a raised step or threshold. Trim any bushes or trees on paths to your home. Check to see if handrails are loose  or broken and that both sides of all steps have handrails. Install guardrails along the edges of any raised decks and porches. Have leaves, snow, or ice cleared regularly. Use sand, salt, or ice melter on paths if you live where there is ice and snow during the winter. Clean up any spills in your garage right away. This includes grease or oil spills. What other actions can I take? Review your medicines with your doctor. Some medicines can cause dizziness or changes in blood pressure, which increase your risk of falling. Wear shoes that: Have a low heel. Do not wear high heels. Have rubber bottoms and are closed at the toe. Feel good on your feet and fit well. Use tools that help you move around if needed. These include: Canes. Walkers. Scooters. Crutches. Ask  your doctor what else you can do to help prevent falls. This may include seeing a physical therapist to learn to do exercises to move better and get stronger. Where to find more information Centers for Disease Control and Prevention, [employer]: TonerPromos.no [employer] on Aging: BaseRingTones.pl National Institute on Aging: BaseRingTones.pl Contact a doctor if: You are afraid of falling at home. You feel weak, drowsy, or dizzy at home. You fall at home. Get help right away if you: Lose consciousness or have trouble moving after a fall. Have a fall that causes a head injury. These symptoms may be an emergency. Get help right away. Call 911. Do not wait to see if the symptoms will go away. Do not drive yourself to the hospital. This information is not intended to replace advice given to you by your health care provider. Make sure you discuss any questions you have with your health care provider. Document Revised: 11/19/2021 Document Reviewed: 11/19/2021 Elsevier Patient Education  2024 [employer].

## 2024-01-30 ENCOUNTER — Ambulatory Visit: Payer: Self-pay | Admitting: Nurse Practitioner

## 2024-01-30 LAB — CBC WITH DIFFERENTIAL/PLATELET
Basophils Absolute: 0 x10E3/uL (ref 0.0–0.2)
Basos: 1 %
EOS (ABSOLUTE): 0.2 x10E3/uL (ref 0.0–0.4)
Eos: 3 %
Hematocrit: 42.9 % (ref 34.0–46.6)
Hemoglobin: 14.2 g/dL (ref 11.1–15.9)
Immature Grans (Abs): 0 x10E3/uL (ref 0.0–0.1)
Immature Granulocytes: 0 %
Lymphocytes Absolute: 2.3 x10E3/uL (ref 0.7–3.1)
Lymphs: 38 %
MCH: 31.1 pg (ref 26.6–33.0)
MCHC: 33.1 g/dL (ref 31.5–35.7)
MCV: 94 fL (ref 79–97)
Monocytes Absolute: 0.5 x10E3/uL (ref 0.1–0.9)
Monocytes: 8 %
Neutrophils Absolute: 3.1 x10E3/uL (ref 1.4–7.0)
Neutrophils: 50 %
Platelets: 274 x10E3/uL (ref 150–450)
RBC: 4.56 x10E6/uL (ref 3.77–5.28)
RDW: 12.8 % (ref 11.7–15.4)
WBC: 6.1 x10E3/uL (ref 3.4–10.8)

## 2024-01-30 LAB — LIPID PANEL
Chol/HDL Ratio: 3.2 ratio (ref 0.0–4.4)
Cholesterol, Total: 170 mg/dL (ref 100–199)
HDL: 53 mg/dL (ref >39)
LDL Chol Calc (NIH): 99 mg/dL (ref 0–99)
Triglycerides: 100 mg/dL (ref 0–149)
VLDL Cholesterol Cal: 18 mg/dL (ref 5–40)

## 2024-01-30 LAB — THYROID PANEL WITH TSH
Free Thyroxine Index: 2.2 (ref 1.2–4.9)
T3 Uptake Ratio: 26 % (ref 24–39)
T4, Total: 8.4 ug/dL (ref 4.5–12.0)
TSH: 1.98 u[IU]/mL (ref 0.450–4.500)

## 2024-01-30 LAB — CMP14+EGFR
ALT: 20 IU/L (ref 0–32)
AST: 19 IU/L (ref 0–40)
Albumin: 4.6 g/dL (ref 3.9–4.9)
Alkaline Phosphatase: 110 IU/L (ref 49–135)
BUN/Creatinine Ratio: 23 (ref 12–28)
BUN: 16 mg/dL (ref 8–27)
Bilirubin Total: 1.1 mg/dL (ref 0.0–1.2)
CO2: 22 mmol/L (ref 20–29)
Calcium: 10.7 mg/dL — ABNORMAL HIGH (ref 8.7–10.3)
Chloride: 102 mmol/L (ref 96–106)
Creatinine, Ser: 0.71 mg/dL (ref 0.57–1.00)
Globulin, Total: 2.2 g/dL (ref 1.5–4.5)
Glucose: 115 mg/dL — ABNORMAL HIGH (ref 70–99)
Potassium: 4.6 mmol/L (ref 3.5–5.2)
Sodium: 140 mmol/L (ref 134–144)
Total Protein: 6.8 g/dL (ref 6.0–8.5)
eGFR: 91 mL/min/1.73 (ref >59)

## 2024-03-16 LAB — HM MAMMOGRAPHY

## 2024-04-27 ENCOUNTER — Ambulatory Visit: Admitting: Nurse Practitioner

## 2024-05-05 ENCOUNTER — Ambulatory Visit

## 2024-05-05 ENCOUNTER — Ambulatory Visit: Payer: Medicare Other

## 2024-05-11 ENCOUNTER — Ambulatory Visit: Admitting: Nurse Practitioner

## 2024-05-26 ENCOUNTER — Ambulatory Visit

## 2024-07-14 ENCOUNTER — Inpatient Hospital Stay: Admitting: Hematology and Oncology
